# Patient Record
Sex: Female | Born: 1945
Health system: Southern US, Community
[De-identification: ages and names within clinical notes are randomized; demographics above are authoritative.]

## PROBLEM LIST (undated history)

## (undated) DIAGNOSIS — K589 Irritable bowel syndrome without diarrhea: Secondary | ICD-10-CM

## (undated) DIAGNOSIS — F32A Depression, unspecified: Secondary | ICD-10-CM

## (undated) DIAGNOSIS — E229 Hyperfunction of pituitary gland, unspecified: Secondary | ICD-10-CM

## (undated) DIAGNOSIS — M797 Fibromyalgia: Secondary | ICD-10-CM

## (undated) DIAGNOSIS — M1990 Unspecified osteoarthritis, unspecified site: Secondary | ICD-10-CM

## (undated) DIAGNOSIS — H409 Unspecified glaucoma: Secondary | ICD-10-CM

## (undated) DIAGNOSIS — M5136 Other intervertebral disc degeneration, lumbar region: Secondary | ICD-10-CM

## (undated) DIAGNOSIS — E7800 Pure hypercholesterolemia, unspecified: Secondary | ICD-10-CM

## (undated) DIAGNOSIS — F419 Anxiety disorder, unspecified: Secondary | ICD-10-CM

## (undated) DIAGNOSIS — F329 Major depressive disorder, single episode, unspecified: Secondary | ICD-10-CM

## (undated) DIAGNOSIS — N342 Other urethritis: Secondary | ICD-10-CM

## (undated) DIAGNOSIS — S92309A Fracture of unspecified metatarsal bone(s), unspecified foot, initial encounter for closed fracture: Secondary | ICD-10-CM

## (undated) DIAGNOSIS — N3281 Overactive bladder: Secondary | ICD-10-CM

## (undated) DIAGNOSIS — S82899A Other fracture of unspecified lower leg, initial encounter for closed fracture: Secondary | ICD-10-CM

## (undated) HISTORY — DX: Other intervertebral disc degeneration, lumbar region: M51.36

## (undated) HISTORY — PX: HEMORROIDECTOMY: SUR656

## (undated) HISTORY — PX: APPENDECTOMY: SHX54

## (undated) HISTORY — DX: Unspecified osteoarthritis, unspecified site: M19.90

## (undated) HISTORY — DX: Irritable bowel syndrome, unspecified: K58.9

## (undated) HISTORY — PX: OTHER SURGICAL HISTORY: SHX169

## (undated) HISTORY — DX: Unspecified glaucoma: H40.9

## (undated) HISTORY — DX: Overactive bladder: N32.81

## (undated) HISTORY — DX: Depression, unspecified: F32.A

## (undated) HISTORY — DX: Other fracture of unspecified lower leg, initial encounter for closed fracture: S82.899A

## (undated) HISTORY — PX: ABDOMINAL HYSTERECTOMY: SHX81

## (undated) HISTORY — DX: Other urethritis: N34.2

## (undated) HISTORY — DX: Pure hypercholesterolemia, unspecified: E78.00

## (undated) HISTORY — PX: CYSTOSCOPY: SUR368

## (undated) HISTORY — PX: TUBAL LIGATION: SHX77

## (undated) HISTORY — PX: CARPECTOMY HAND: SUR194

## (undated) HISTORY — PX: ROTATOR CUFF REPAIR: SHX139

## (undated) HISTORY — PX: BURCH PROCEDURE: SHX1273

## (undated) HISTORY — PX: INTERSTIM IMPLANT PLACEMENT: SHX5130

## (undated) HISTORY — DX: Fibromyalgia: M79.7

## (undated) HISTORY — PX: CATARACT EXTRACTION: SUR2

## (undated) HISTORY — PX: HYSTEROSCOPY: SHX211

## (undated) HISTORY — PX: TONSILLECTOMY AND ADENOIDECTOMY: SUR1326

## (undated) HISTORY — PX: BREAST BIOPSY: SHX20

## (undated) HISTORY — DX: Major depressive disorder, single episode, unspecified: F32.9

## (undated) HISTORY — PX: CHOLECYSTECTOMY OPEN: SUR202

## (undated) HISTORY — DX: Fracture of unspecified metatarsal bone(s), unspecified foot, initial encounter for closed fracture: S92.309A

## (undated) HISTORY — DX: Hyperfunction of pituitary gland, unspecified: E22.9

---

## 1992-03-14 HISTORY — PX: BREAST EXCISIONAL BIOPSY: SUR124

## 1997-05-22 ENCOUNTER — Ambulatory Visit (HOSPITAL_COMMUNITY): Admission: RE | Admit: 1997-05-22 | Discharge: 1997-05-22 | Payer: Self-pay | Admitting: Gynecology

## 1997-06-12 ENCOUNTER — Encounter: Admission: RE | Admit: 1997-06-12 | Discharge: 1997-09-10 | Payer: Self-pay | Admitting: Orthopaedic Surgery

## 1997-08-27 ENCOUNTER — Ambulatory Visit (HOSPITAL_COMMUNITY): Admission: RE | Admit: 1997-08-27 | Discharge: 1997-08-27 | Payer: Self-pay | Admitting: Gastroenterology

## 1998-05-01 ENCOUNTER — Ambulatory Visit (HOSPITAL_COMMUNITY): Admission: RE | Admit: 1998-05-01 | Discharge: 1998-05-01 | Payer: Self-pay

## 1998-06-30 ENCOUNTER — Encounter: Payer: Self-pay | Admitting: Gynecology

## 1998-06-30 ENCOUNTER — Ambulatory Visit (HOSPITAL_COMMUNITY): Admission: RE | Admit: 1998-06-30 | Discharge: 1998-06-30 | Payer: Self-pay | Admitting: Gynecology

## 1998-07-16 ENCOUNTER — Other Ambulatory Visit: Admission: RE | Admit: 1998-07-16 | Discharge: 1998-07-16 | Payer: Self-pay | Admitting: Gynecology

## 1998-08-19 ENCOUNTER — Encounter: Payer: Self-pay | Admitting: Physician Assistant

## 1998-08-19 ENCOUNTER — Ambulatory Visit (HOSPITAL_COMMUNITY): Admission: RE | Admit: 1998-08-19 | Discharge: 1998-08-19 | Payer: Self-pay | Admitting: Physician Assistant

## 1998-08-21 ENCOUNTER — Ambulatory Visit (HOSPITAL_COMMUNITY): Admission: RE | Admit: 1998-08-21 | Discharge: 1998-08-21 | Payer: Self-pay | Admitting: Neurology

## 1998-09-03 ENCOUNTER — Ambulatory Visit (HOSPITAL_COMMUNITY): Admission: RE | Admit: 1998-09-03 | Discharge: 1998-09-03 | Payer: Self-pay | Admitting: Neurology

## 1998-09-03 ENCOUNTER — Encounter: Payer: Self-pay | Admitting: Neurology

## 1999-07-09 ENCOUNTER — Encounter: Payer: Self-pay | Admitting: Gynecology

## 1999-07-09 ENCOUNTER — Ambulatory Visit (HOSPITAL_COMMUNITY): Admission: RE | Admit: 1999-07-09 | Discharge: 1999-07-09 | Payer: Self-pay | Admitting: Gynecology

## 1999-08-04 ENCOUNTER — Encounter: Payer: Self-pay | Admitting: Urology

## 1999-08-06 ENCOUNTER — Encounter (INDEPENDENT_AMBULATORY_CARE_PROVIDER_SITE_OTHER): Payer: Self-pay | Admitting: Specialist

## 1999-08-06 ENCOUNTER — Encounter: Payer: Self-pay | Admitting: Urology

## 1999-08-06 ENCOUNTER — Ambulatory Visit (HOSPITAL_COMMUNITY): Admission: RE | Admit: 1999-08-06 | Discharge: 1999-08-06 | Payer: Self-pay | Admitting: Urology

## 1999-08-23 ENCOUNTER — Other Ambulatory Visit: Admission: RE | Admit: 1999-08-23 | Discharge: 1999-08-23 | Payer: Self-pay | Admitting: Gynecology

## 1999-08-31 ENCOUNTER — Ambulatory Visit (HOSPITAL_BASED_OUTPATIENT_CLINIC_OR_DEPARTMENT_OTHER): Admission: RE | Admit: 1999-08-31 | Discharge: 1999-09-01 | Payer: Self-pay | Admitting: Orthopaedic Surgery

## 1999-11-23 ENCOUNTER — Encounter: Admission: RE | Admit: 1999-11-23 | Discharge: 1999-11-24 | Payer: Self-pay | Admitting: Orthopaedic Surgery

## 2000-06-12 ENCOUNTER — Encounter: Payer: Self-pay | Admitting: Orthopaedic Surgery

## 2000-06-12 ENCOUNTER — Ambulatory Visit (HOSPITAL_COMMUNITY): Admission: RE | Admit: 2000-06-12 | Discharge: 2000-06-12 | Payer: Self-pay | Admitting: Orthopaedic Surgery

## 2000-06-23 ENCOUNTER — Ambulatory Visit (HOSPITAL_COMMUNITY): Admission: RE | Admit: 2000-06-23 | Discharge: 2000-06-23 | Payer: Self-pay | Admitting: Orthopedic Surgery

## 2000-06-23 ENCOUNTER — Encounter: Payer: Self-pay | Admitting: Orthopedic Surgery

## 2000-07-06 ENCOUNTER — Ambulatory Visit (HOSPITAL_COMMUNITY): Admission: RE | Admit: 2000-07-06 | Discharge: 2000-07-06 | Payer: Self-pay | Admitting: Endocrinology

## 2000-07-06 ENCOUNTER — Encounter: Payer: Self-pay | Admitting: Orthopedic Surgery

## 2000-08-10 ENCOUNTER — Ambulatory Visit (HOSPITAL_BASED_OUTPATIENT_CLINIC_OR_DEPARTMENT_OTHER): Admission: RE | Admit: 2000-08-10 | Discharge: 2000-08-11 | Payer: Self-pay | Admitting: Orthopedic Surgery

## 2000-08-10 ENCOUNTER — Encounter (INDEPENDENT_AMBULATORY_CARE_PROVIDER_SITE_OTHER): Payer: Self-pay | Admitting: Specialist

## 2000-09-13 ENCOUNTER — Ambulatory Visit (HOSPITAL_COMMUNITY): Admission: RE | Admit: 2000-09-13 | Discharge: 2000-09-13 | Payer: Self-pay | Admitting: Gastroenterology

## 2000-09-13 ENCOUNTER — Encounter: Payer: Self-pay | Admitting: Gastroenterology

## 2000-10-11 ENCOUNTER — Inpatient Hospital Stay (HOSPITAL_COMMUNITY): Admission: EM | Admit: 2000-10-11 | Discharge: 2000-10-16 | Payer: Self-pay | Admitting: Emergency Medicine

## 2000-10-11 ENCOUNTER — Encounter: Payer: Self-pay | Admitting: Emergency Medicine

## 2000-10-11 ENCOUNTER — Encounter: Payer: Self-pay | Admitting: Geriatric Medicine

## 2000-10-13 ENCOUNTER — Encounter: Payer: Self-pay | Admitting: Gastroenterology

## 2000-11-02 ENCOUNTER — Other Ambulatory Visit: Admission: RE | Admit: 2000-11-02 | Discharge: 2000-11-02 | Payer: Self-pay | Admitting: Gynecology

## 2001-03-08 ENCOUNTER — Encounter: Payer: Self-pay | Admitting: Gynecology

## 2001-03-08 ENCOUNTER — Encounter: Admission: RE | Admit: 2001-03-08 | Discharge: 2001-03-08 | Payer: Self-pay | Admitting: Gynecology

## 2001-09-21 ENCOUNTER — Encounter: Admission: RE | Admit: 2001-09-21 | Discharge: 2001-09-21 | Payer: Self-pay | Admitting: Gastroenterology

## 2001-09-21 ENCOUNTER — Encounter: Payer: Self-pay | Admitting: Gastroenterology

## 2001-12-25 ENCOUNTER — Ambulatory Visit (HOSPITAL_COMMUNITY): Admission: RE | Admit: 2001-12-25 | Discharge: 2001-12-25 | Payer: Self-pay | Admitting: Pain Medicine

## 2001-12-25 ENCOUNTER — Encounter: Payer: Self-pay | Admitting: Pain Medicine

## 2002-01-22 ENCOUNTER — Other Ambulatory Visit: Admission: RE | Admit: 2002-01-22 | Discharge: 2002-01-22 | Payer: Self-pay | Admitting: Gynecology

## 2002-03-28 ENCOUNTER — Ambulatory Visit (HOSPITAL_COMMUNITY): Admission: RE | Admit: 2002-03-28 | Discharge: 2002-03-28 | Payer: Self-pay | Admitting: Gynecology

## 2002-03-28 ENCOUNTER — Encounter: Payer: Self-pay | Admitting: Gastroenterology

## 2002-04-19 ENCOUNTER — Encounter: Payer: Self-pay | Admitting: Neurosurgery

## 2002-04-23 ENCOUNTER — Inpatient Hospital Stay (HOSPITAL_COMMUNITY): Admission: RE | Admit: 2002-04-23 | Discharge: 2002-04-27 | Payer: Self-pay | Admitting: Neurosurgery

## 2002-04-23 ENCOUNTER — Encounter: Payer: Self-pay | Admitting: Neurosurgery

## 2002-05-13 DIAGNOSIS — R7989 Other specified abnormal findings of blood chemistry: Secondary | ICD-10-CM

## 2002-05-13 HISTORY — DX: Other specified abnormal findings of blood chemistry: R79.89

## 2003-01-21 ENCOUNTER — Encounter: Admission: RE | Admit: 2003-01-21 | Discharge: 2003-04-21 | Payer: Self-pay | Admitting: Gastroenterology

## 2003-01-28 ENCOUNTER — Encounter: Admission: RE | Admit: 2003-01-28 | Discharge: 2003-01-28 | Payer: Self-pay | Admitting: Gastroenterology

## 2003-02-18 ENCOUNTER — Other Ambulatory Visit: Admission: RE | Admit: 2003-02-18 | Discharge: 2003-02-18 | Payer: Self-pay | Admitting: Gynecology

## 2003-07-29 ENCOUNTER — Ambulatory Visit (HOSPITAL_COMMUNITY): Admission: RE | Admit: 2003-07-29 | Discharge: 2003-07-29 | Payer: Self-pay | Admitting: Gastroenterology

## 2004-03-02 ENCOUNTER — Ambulatory Visit (HOSPITAL_COMMUNITY): Admission: RE | Admit: 2004-03-02 | Discharge: 2004-03-02 | Payer: Self-pay | Admitting: Orthopedic Surgery

## 2004-03-03 ENCOUNTER — Ambulatory Visit (HOSPITAL_COMMUNITY): Admission: RE | Admit: 2004-03-03 | Discharge: 2004-03-03 | Payer: Self-pay | Admitting: Neurosurgery

## 2004-03-04 ENCOUNTER — Ambulatory Visit (HOSPITAL_COMMUNITY): Admission: RE | Admit: 2004-03-04 | Discharge: 2004-03-04 | Payer: Self-pay | Admitting: Orthopedic Surgery

## 2004-08-12 ENCOUNTER — Ambulatory Visit (HOSPITAL_COMMUNITY): Admission: RE | Admit: 2004-08-12 | Discharge: 2004-08-12 | Payer: Self-pay | Admitting: Gynecology

## 2004-08-19 ENCOUNTER — Other Ambulatory Visit: Admission: RE | Admit: 2004-08-19 | Discharge: 2004-08-19 | Payer: Self-pay | Admitting: Gynecology

## 2004-09-13 ENCOUNTER — Emergency Department (HOSPITAL_COMMUNITY): Admission: EM | Admit: 2004-09-13 | Discharge: 2004-09-13 | Payer: Self-pay | Admitting: Emergency Medicine

## 2005-03-14 DIAGNOSIS — M51369 Other intervertebral disc degeneration, lumbar region without mention of lumbar back pain or lower extremity pain: Secondary | ICD-10-CM

## 2005-03-14 DIAGNOSIS — M5136 Other intervertebral disc degeneration, lumbar region: Secondary | ICD-10-CM

## 2005-03-14 HISTORY — DX: Other intervertebral disc degeneration, lumbar region without mention of lumbar back pain or lower extremity pain: M51.369

## 2005-03-14 HISTORY — DX: Other intervertebral disc degeneration, lumbar region: M51.36

## 2005-08-22 ENCOUNTER — Other Ambulatory Visit: Admission: RE | Admit: 2005-08-22 | Discharge: 2005-08-22 | Payer: Self-pay | Admitting: Gynecology

## 2005-11-15 ENCOUNTER — Ambulatory Visit (HOSPITAL_COMMUNITY): Admission: RE | Admit: 2005-11-15 | Discharge: 2005-11-15 | Payer: Self-pay | Admitting: Gynecology

## 2006-03-19 ENCOUNTER — Encounter: Admission: RE | Admit: 2006-03-19 | Discharge: 2006-03-19 | Payer: Self-pay | Admitting: Orthopaedic Surgery

## 2006-04-10 ENCOUNTER — Encounter: Admission: RE | Admit: 2006-04-10 | Discharge: 2006-04-10 | Payer: Self-pay | Admitting: Orthopaedic Surgery

## 2006-04-24 ENCOUNTER — Encounter: Admission: RE | Admit: 2006-04-24 | Discharge: 2006-04-24 | Payer: Self-pay | Admitting: Orthopaedic Surgery

## 2006-08-22 ENCOUNTER — Ambulatory Visit (HOSPITAL_BASED_OUTPATIENT_CLINIC_OR_DEPARTMENT_OTHER): Admission: RE | Admit: 2006-08-22 | Discharge: 2006-08-22 | Payer: Self-pay | Admitting: Urology

## 2006-12-01 ENCOUNTER — Ambulatory Visit (HOSPITAL_COMMUNITY): Admission: RE | Admit: 2006-12-01 | Discharge: 2006-12-01 | Payer: Self-pay | Admitting: Gynecology

## 2007-03-08 ENCOUNTER — Emergency Department (HOSPITAL_COMMUNITY): Admission: EM | Admit: 2007-03-08 | Discharge: 2007-03-08 | Payer: Self-pay | Admitting: Emergency Medicine

## 2007-03-15 HISTORY — PX: OTHER SURGICAL HISTORY: SHX169

## 2007-12-19 ENCOUNTER — Ambulatory Visit (HOSPITAL_COMMUNITY): Admission: RE | Admit: 2007-12-19 | Discharge: 2007-12-19 | Payer: Self-pay | Admitting: Gynecology

## 2007-12-22 ENCOUNTER — Encounter: Admission: RE | Admit: 2007-12-22 | Discharge: 2007-12-22 | Payer: Self-pay | Admitting: Orthopaedic Surgery

## 2008-01-16 ENCOUNTER — Ambulatory Visit (HOSPITAL_BASED_OUTPATIENT_CLINIC_OR_DEPARTMENT_OTHER): Admission: RE | Admit: 2008-01-16 | Discharge: 2008-01-17 | Payer: Self-pay | Admitting: Orthopedic Surgery

## 2008-01-16 ENCOUNTER — Encounter (INDEPENDENT_AMBULATORY_CARE_PROVIDER_SITE_OTHER): Payer: Self-pay | Admitting: Orthopedic Surgery

## 2008-01-25 ENCOUNTER — Other Ambulatory Visit: Admission: RE | Admit: 2008-01-25 | Discharge: 2008-01-25 | Payer: Self-pay | Admitting: Gynecology

## 2008-01-25 ENCOUNTER — Ambulatory Visit: Payer: Self-pay | Admitting: Gynecology

## 2008-01-25 ENCOUNTER — Encounter: Payer: Self-pay | Admitting: Gynecology

## 2008-03-14 HISTORY — PX: ORIF RADIAL HEAD / NECK FRACTURE: SUR956

## 2008-06-06 ENCOUNTER — Ambulatory Visit (HOSPITAL_BASED_OUTPATIENT_CLINIC_OR_DEPARTMENT_OTHER): Admission: RE | Admit: 2008-06-06 | Discharge: 2008-06-06 | Payer: Self-pay | Admitting: Orthopedic Surgery

## 2008-07-25 ENCOUNTER — Encounter: Admission: RE | Admit: 2008-07-25 | Discharge: 2008-07-25 | Payer: Self-pay | Admitting: Gastroenterology

## 2009-01-14 ENCOUNTER — Ambulatory Visit (HOSPITAL_COMMUNITY): Admission: RE | Admit: 2009-01-14 | Discharge: 2009-01-14 | Payer: Self-pay | Admitting: Gynecology

## 2009-02-27 ENCOUNTER — Ambulatory Visit: Payer: Self-pay | Admitting: Gynecology

## 2009-04-24 ENCOUNTER — Encounter: Admission: RE | Admit: 2009-04-24 | Discharge: 2009-04-24 | Payer: Self-pay | Admitting: Orthopaedic Surgery

## 2010-01-28 ENCOUNTER — Ambulatory Visit (HOSPITAL_COMMUNITY): Admission: RE | Admit: 2010-01-28 | Discharge: 2010-01-28 | Payer: Self-pay | Admitting: Internal Medicine

## 2010-03-01 ENCOUNTER — Ambulatory Visit: Payer: Self-pay | Admitting: Gynecology

## 2010-04-03 ENCOUNTER — Encounter: Payer: Self-pay | Admitting: Gastroenterology

## 2010-04-03 ENCOUNTER — Encounter: Payer: Self-pay | Admitting: Orthopedic Surgery

## 2010-04-04 ENCOUNTER — Encounter: Payer: Self-pay | Admitting: Gynecology

## 2010-04-04 ENCOUNTER — Encounter: Payer: Self-pay | Admitting: Orthopaedic Surgery

## 2010-06-24 LAB — POCT I-STAT, CHEM 8
Calcium, Ion: 1.01 mmol/L — ABNORMAL LOW (ref 1.12–1.32)
Creatinine, Ser: 0.8 mg/dL (ref 0.4–1.2)
Glucose, Bld: 103 mg/dL — ABNORMAL HIGH (ref 70–99)
HCT: 35 % — ABNORMAL LOW (ref 36.0–46.0)
Potassium: 4.3 mEq/L (ref 3.5–5.1)
TCO2: 26 mmol/L (ref 0–100)

## 2010-06-24 LAB — GLUCOSE, CAPILLARY: Glucose-Capillary: 83 mg/dL (ref 70–99)

## 2010-07-27 NOTE — Op Note (Signed)
NAME:  Linda Lucas, Linda Lucas                 ACCOUNT NO.:  [id number]   MEDICAL RECORD NO.:  [id number]          PATIENT TYPE:  AMB   LOCATION:  DSC                          FACILITY:  [facility]   PHYSICIAN:  Cindee Salt, M.D.       DATE OF BIRTH:  October 31, 1945   DATE OF PROCEDURE:  DATE OF DISCHARGE:                               OPERATIVE REPORT   PREOPERATIVE DIAGNOSES:  Status post proximal carpectomy for keen box  disease, left wrist with degenerative arthritis, cyst distal radius,  left wrist.   POSTOPERATIVE DIAGNOSES:  Status post proximal carpectomy for keen box  disease, left wrist with degenerative arthritis, cyst distal radius,  left wrist.   OPERATION:  Excision of distal radius cyst with bone graft, fusion of  left wrist with Synthes wrist fusion plate, infused bone graft, and  cadaver chips.   SURGEON:  Cindee Salt, MD   ASSISTANT:  RN   ANESTHESIA:  General.   DATE OF OPERATION:  January 16, 2008   ANESTHESIOLOGIST:  Burna Forts, MD   HISTORY:  The patient is a 65 year old female with a history of keen box  disease, has undergone a proximal carpectomy.  This has unfortunately  degenerated with a large cyst forming in the distal radius, continued  pain.  X-rays revealed degeneration of the radiocarpal joint.  She has  elected to proceed to have a fusion performed.  She is aware of risks  and complications including infection, recurrence, injury to arteries,  nerves, tendons, incomplete relief of symptoms, dystrophy, nonunion.  In  the preoperative area, the patient was seen.  The extremity marked by  both the patient and surgeon.  Antibiotic given.   PROCEDURE:  The patient was brought to the operating room where a  general anesthetic was carried out without difficulty after a axillary  block was given.  She was prepped using DuraPrep, supine position, left  arm free.  A time-out was taken.  The extremity was exsanguinated with  an Esmarch bandage.  Tourniquet placed  high and the arm was inflated to  250 mmHg.  A longitudinal incision was made incorporating the old  incision, carried down through subcutaneous tissue.  Bleeders were  electrocauterized.  Dissection was carried down to the third, fourth  dorsal compartment.  This was then elevated off from the distal radius  subperiosteally.  Incision made distally.  The radiocarpal joint was  opened.  Significant synovitis was present.  Complete degeneration of  the articular surface of the lunate facet, and the distal radius was  present along with degeneration of the proximal capitate.  The joint was  debrided of synovial tissue and granulation tissue.  The distal radius  was then curetted removing any remaining cartilage.  A bur was then used  to bur this down.  Multiple drill holes were placed with a  0.062 K-  wire.  The cyst was encountered.  This was opened with the curette.  The  cyst wall was removed.  The rongeur was then used to remove the  articular surface from proximal capitate, the proximal hamate,  and the  trapezoid along with the remnants of the distal radius.  The wound was  then copiously irrigated with saline 5 mL.  The cadaver bone graft chips  were then morselized.  These were then placed into the cyst of the  distal radius entirely filling it.  A 0.7 mL infused bone graft was then  opened.  This was soaked for 10 minutes into the collagen pad.  The bone  chips were then placed into the distal radius.  The infused bone graft  was then placed along with remainder of the cadaver chips distally.  This firmly stabilized the radiocarpal joint.  The straight wrist fusion  plate was then selected.  This was placed onto the distal radius and  attached with a single screw to allow rotation at the level of the  Lister's tubercle.  A 26-mm screw was placed.  The plate was then  rotated as necessary to fit to the third metacarpal.  The  carpometacarpal joint was then opened.  The cartilage  removed with  curettes and rongeurs and this was packed with cadaver bone.  The plate  was rotated back into position and fixed distally with 12-14 mm screws  distally.  These were 2.7 mm screws after drilling with a to 2.0 mm  drill bit.  The x-rays taken revealed the plate lied in good position.  The remaining screws were placed measuring 14-20 mm into the distal  radius.  A 26-mm screw was removed and replaced with a 14-mm screw.  X-  rays confirmed positioning of the wrist with filling of the radiocarpal  joint with the bone.  The wound was again closed with figure-of-eight 2-  0 Vicryl sutures, was then copiously irrigated with saline.  The  retinaculum, subcutaneous tissue was closed in layers with interrupted 4-  0 Vicryl sutures and the skin with interrupted 4-0 Vicryl Rapide  sutures.  A sterile compressive dressing dorsal palmar splint applied  and deflation of tourniquet, all fingers immediately pinked.  She was  taken to the recovery room for observation in satisfactory condition.  Throughout the procedure, bleeders were electrocauterized with bipolar.  The dorsal sensory radial nerve was identified and protected.  On  deflation of the tourniquet, all fingers immediately pinked.  She was  taken to the recovery room for observation in satisfactory condition,  will be admitted for overnight stay.  She will be discharged on Vicodin  to return in 1 week.           ______________________________  Cindee Salt, M.D.     GK/MEDQ  D:  01/16/2008  T:  01/17/2008  Job:  [id number]   cc:   Tasia Catchings, M.D.

## 2010-07-27 NOTE — Op Note (Signed)
Linda Lucas, Linda Lucas                 ACCOUNT NO.:  [id number]   MEDICAL RECORD NO.:  [id number]          PATIENT TYPE:  AMB   LOCATION:  [facility]                         FACILITY:  [facility]   PHYSICIAN:  Jamison Neighbor, M.D.  DATE OF BIRTH:  October 17, 1945   DATE OF PROCEDURE:  08/22/2006  DATE OF DISCHARGE:                               OPERATIVE REPORT   PREOPERATIVE DIAGNOSIS:  1. Chronic cystitis/urethritis rule out interstitial cystitis.  2. Microscopic hematuria.   POSTOPERATIVE DIAGNOSIS:  1. Chronic cystitis/urethritis rule out interstitial cystitis.  2. Microscopic hematuria.   PROCEDURE:  Cystoscopy, urethral calibration, bilateral retrogrades  (duplicated system on left).  Hydrodistention of the bladder Marcaine  and Pyridium installation, Marcaine and Kenalog injection.   SURGEON:  Jamison Neighbor, M.D.   ANESTHESIA:  General.   COMPLICATIONS:  None.   DRAINS:  None.   BRIEF HISTORY:  This 65 year old female has had longstanding problems  with what has been presumed to be chronic cystitis slash urethritis.  She responds at least in part to antibiotic therapy, but recently has  had problems where she has symptoms even when she has negative urine and  an unremarkable physical exam, insofar as the urethritis discharge, etc.  are concerned.  The patient has also had some recent problems with  microscopic hematuria.  She is certainly a candidate to have  interstitial cystitis as she does have known irritable bowel syndrome.  She is interested having a formal evaluation to determine if she does  indeed have IC.  Full informed consent was obtained.   PROCEDURE:  After successful induction of general anesthesia, the  patient was placed in the dorsal position, prepped with Betadine and  draped in usual sterile fashion.  Careful bimanual examination revealed  no significant cystocele.  There was no vault prolapse.  There was no  real enterocele or rectocele and there was nothing  suggestive of  urethral diverticulum.  The urethra was calibrated 32-French with female  urethral sounds with no evidence of stenosis or stricture.  The  cystoscope was inserted.  The bladder was carefully inspected.  No  tumors or stones could be seen.  The right ureteral orifice was normal  in configuration and location on the left-hand side there was fairly  large ureter in the more medial position and had a somewhat smaller  ureter laterally.  As expected, the medial ureter drained the upper pole  segment and the lateral ureter drained the lower pole segment.  Retrograde pyelograms were performed bilaterally.   On the right-hand side a cone-tipped catheter was inserted to the ureter  and contrast was injected.  The patient had a normal-appearing ureter  without filling defect or stricture.  The collecting system was  unremarkable was no evidence of any obstruction.  There were no stones  or filling defects a little bit of pyelovenous backflow was noted due to  over filling of the collecting system.  Clips from gallbladder procedure  were identified on the left-hand side, the retrograde was performed  first in the larger ureteral orifice which was more medial and turned  out to drain the upper pole.  This was a very small segment and will  likely only drain a small portion of the kidney.  There were no filling  defects and drain out films were normal.  The smaller ureter on the left-  hand side actually drained the mid pole and lower pole segment and these  also appeared normal.  The drain out films on both sides were  unremarkable.   After completion of the retrograde studies hydrodistention of the  bladder was performed.  The bladder was distended at a pressure of 100  cm of water for 5 minutes.  When the bladder was drained no granulations  or ulcers could be seen the bladder appeared normal and bladder capacity  was 1000 mL.  There was nothing to suggest a need for biopsy.  There  was  nothing bleeding and really we did not see anything that would serve as  a source for the patient's chronic lower urinary tract symptoms.  Our  plan will be to send her home on her same medications which were  extensive and to continue her on antibiotic prophylaxis plus urinary  acidification which is felt to decrease her infections.  We will take a  look at whether she has any improvement following the procedure and  unless we see major changes we would not consider this to be an  interstitial cystitis situation.  Long term I think the patient will  need to be looked at for anticholinergic therapy and if that should not  work, we will offer her either Botox, a spot in the RTX trial or a trial  InterStim.           ______________________________  Jamison Neighbor, M.D.  Electronically Signed     RJE/MEDQ  D:  08/22/2006  T:  08/22/2006  Job:  [id number]

## 2010-07-27 NOTE — Op Note (Signed)
Linda Lucas, Linda Lucas                 ACCOUNT NO.:  [id number]   MEDICAL RECORD NO.:  [id number]          PATIENT TYPE:  AMB   LOCATION:  DSC                          FACILITY:  [facility]   PHYSICIAN:  Cindee Salt, M.D.       DATE OF BIRTH:  1945-06-27   DATE OF PROCEDURE:  06/06/2008  DATE OF DISCHARGE:                               OPERATIVE REPORT   PREOPERATIVE DIAGNOSIS:  Comminuted intra-articular fracture, right  distal radius.   POSTOPERATIVE DIAGNOSIS:  Comminuted intra-articular fracture, right  distal radius.   OPERATION:  Open reduction and internal fixation, right distal radius  with narrow DVR plate for the right wrist.   SURGEON:  Cindee Salt, MD   ASSISTANT:  Joaquin Courts, RN   ANESTHESIA:  Axillary general.   ANESTHESIOLOGIST:  Janetta Hora. Frederick, MD   HISTORY:  The patient is a 65 year old female who suffered a fall with  comminuted intra-articular fracture of her right distal radius.  This  has displaced.  She is admitted now for open reduction and internal  fixation.  She is aware of risks and complications including infection,  recurrence, injury to arteries, nerves, tendons, incomplete relief of  symptoms, dystrophy, and the possibility of nonunion.  In the  preoperative area, the patient is seen, the extremity marked by both the  patient and surgeon, and antibiotic given.  Questions were again  encouraged and answered.   PROCEDURE IN DETAIL:  The patient was brought to the operating room  where an axillary block was carried out without difficulty under the  direction of Dr. Gelene Mink.  A general anesthetic was also carried out  with LMA.  She was prepped using ChloraPrep, supine position, right arm  free.  The limb was manipulated.  X-rays revealed that the fracture was  not particularly movable.  After a time-out was taken, the limb was  exsanguinated with an Esmarch bandage.  Tourniquet placed high on the  arm was inflated to 250 mmHg.  A volar radial  incision was made, carried  down through subcutaneous tissue.  This was directed over the flexor  carpi radialis tendon.  Bleeders were electrocauterized.  Dissection  carried through the flexor carpi radialis tendon sheath.  Radial artery  was identified and protected.  The pronator quadratus was elevated off  from the distal radius.  The fracture was apparent with displacement.  With blunt and sharp dissection, this was manipulated.  The periosteum  was removed and Freer elevators were used to elevate the fracture  fragment.  This allowed this to be re-manipulated in backward position.  AP and lateral x-rays revealed that it was reduced without fixation,  however, was not stable.  The brachioradialis was then step-cut.  The  narrow DVR plate was then selected.  This was placed, positioned, and  maintained in position with 0.045 K-wires.  X-rays revealed that it was  slightly proximal.  This was readjusted distally, re-pinned, re-x-rayed  and found to lie in good position in both AP and lateral directions.  The gliding screw was then placed.  This measured 10 mm after  drilling  with a 2.7 mm drill bit.  This firmly fixed the plate in position.  The  ulnar-most distal peg was then inserted.  This was inserted as a fully-  threaded nonlocking screw pulling the bone up against the plate.  X-rays  confirmed positioning outside the joint.  This measured 18 mm.  The  remaining pegs were placed through the central holes in the plate.  These were all locking and measured 20-22 mm.  These were each placed  fixing the ulnar component in place.  The radial two screws were placed  as fully threaded screws.  These measured 20 mm and 18 mm.  These were  placed firmly fixing it.  AP and lateral x-rays revealed the plate to  lie in good position.  Obliques revealed that no screws or pins were  within the joint.  The remaining two proximal screws were then placed.  These were each drilled, measured, and  10-mm screws were placed firmly  fixing the plate in position.  X-rays revealed that none of the screws  were long.  The wound was copiously irrigated with saline.  Full  pronation and supination was afforded to the patient with  flexion/extension, radial and ulnar deviation of the wrist.  The  pronator quadratus was then reapproximated over the plate with figure-of-  eight 4-0 Vicryl sutures.  The subcutaneous tissue was closed with  interrupted 4-0 Vicryl and the skin with a subcuticular 4-0 Vicryl  Rapide suture.  A sterile compressive dressing and dorsal palmar splint  applied.  On deflation of the tourniquet, all fingers immediately  pinked.  She was taken to the recovery room for observation in  satisfactory condition.  She will be discharged to home to return to the  [facility] in 1 week on Percocet at her request.  She was  given Percocet 7.5/325, #40 with no refills.           ______________________________  Cindee Salt, M.D.     GK/MEDQ  D:  06/06/2008  T:  06/07/2008  Job:  [id number]

## 2010-07-30 NOTE — Op Note (Signed)
[facility]  Patient:    Linda Lucas, Linda Lucas                        MRN: [id number] Proc. Date: 08/06/99 Adm. Date:  [id number] Disc. Date: [id number] Attending:  Londell Moh CC:         Jamison Neighbor, M.D.                           Operative Report  PREOPERATIVE DIAGNOSES: 1. 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 2. 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 3. Chronic urethritis/cystitis.  POSTOPERATIVE DIAGNOSES: 1. 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 2. 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 3. Chronic urethritis/cystitis.  PROCEDURE:  Cystoscopy, urethral calibration, hydrodistention of the bladder, ureteral catheterization, hydrodistention, cystogram, Marcaine and Pyridium instillation, Marcaine and Kenalog injection, bladder biopsy.  SURGEON:  Jamison Neighbor, M.D.  ANESTHESIA:  General.  COMPLICATIONS:  None.  DRAINS:  None.  BRIEF HISTORY:  This 65 year old female has had problems with urethral syndrome/chronic urethritis for many years.  The patient occasionally has positive cultures but generally has urethral pain without well-documented urinary tract infections.  In the past, she had marked stress urinary incontinence.  Because of her chronic urethral pain, she would not undergo standard vaginal-type surgery and elected to have a Burch procedure.  The Burch procedure has been successful in terms of controlling her stress incontinence, but she has had intermittent episodes of urgency.  The patient is not felt to be obstructed but does need urethral calibration to assess the integrity of her urethra and make sure she is not obstructing.  The patient did not wish to have this done in the office.  In addition, there was increasing evidence that urethral syndrome may be a precursor to interstitial cystitis, and she needs evaluation for that purpose as well.  In the past, the patient had a ureterocele, which was successfully opened,  and under this anesthetic a cystogram was done to rule out reflux as a source of chronic problems.  The patient understands the risks and benefits of the procedure and gave informed consent.  DESCRIPTION OF PROCEDURE:  After successful induction of general anesthesia, the patient was placed in the dorsal lithotomy position and prepped with Betadine and draped in the usual sterile fashion.  Careful bimanual examination revealed good support for the urethra.  In the _____ position, there was no evidence whatsoever for excessive angulation or obstruction, there was no cystocele or ureterocele.  There was a very minimal rectocele. There was no enterocele.  There were no masses detected on bimanual exam.  The urethra easily accepted up to a 84 [place] female urethral sound without the need for dilation.  There was no stenosis or stricture.  The urethra was in normal position with no angulation and no over-correction.  The cystoscope was inserted and the bladder carefully inspected.  No obvious tumors were seen. In the trigone area, there was one small urethral possibly lesion, which was biopsied, and the biopsy site was cauterized.  This appears to be completely benign.  There was no real squamous metaplasia.  The left ureteral orifice was normal.  The right ureter was completely duplicated, with the ureterocele noted.  This had been completely opened, and the ureter now appears to be reasonably normal.  Both ureters on that side were catheterized.  There was no obstruction, and they appeared to be fully functional and normal.  The remainder  of the bladder was unremarkable in its appearance.  The bladder was then distended to 100 cm pressure.  When the bladder was drained, there were no glomerulations seen, no bleeding, essentially ruling out interstitial cystitis.  We do note, however, that the patients capacity is small for reasons that are unclear, with a capacity of only 600 cc.  A cystogram  was done and showed no reflux of either of the duplicated ureteral moieties.  The bladder was drained, and drain-off film was normal.  A mixture of Marcaine and Pyridium was instilled in the bladder, and Marcaine and Kenalog were injected periurethrally.  The patient had gauze placed in the vagina.  She was given Zofran and Toradol in the operating room prior to going to the recovery room. The patient did have a moderate headache prior to the procedure and may require additional pain medication in the postoperative period.  She is given a prescription for Darvocet as well as _____, which is the most effective antibiotic for her in the past.  She will return to my office in three weeks time. DD:  08/06/99 TD:  08/10/99 Job: [id number]

## 2010-07-30 NOTE — Discharge Summary (Signed)
   NAME:  Linda Lucas, Linda Lucas                           ACCOUNT NO.:  [id number]   MEDICAL RECORD NO.:  [id number]                   PATIENT TYPE:  INP   LOCATION:  3019                                 FACILITY:  [facility]   PHYSICIAN:  Clydene Fake, M.D.               DATE OF BIRTH:  1946-01-21   DATE OF ADMISSION:  04/23/2002  DATE OF DISCHARGE:  04/27/2002                                 DISCHARGE SUMMARY   ADMISSION DIAGNOSES:  Herniated nucleus pulposus at C4-5 with severe  spondylosis at C4-5, C5-6, and C6-7.   DISCHARGE DIAGNOSES:  Herniated nucleus pulposus at C4-5 with severe  spondylosis at C4-5, C5-6, and C6-7.   PROCEDURES:  Anterior cervical diskectomy and fusion C4-5, 5-6, and 6-7 with  anterior plate.   REASON FOR ADMISSION:  The patient is a 65 year old woman who has had severe  headaches, neck ache, and found to have herniated disk and spondylosis at  multiple levels of the cervical spine, brought in for decompression and  fusion.   HOSPITAL COURSE:  The patient was admitted on the day of surgery and  underwent the procedure named above without complications.  Postoperatively,  she was transferred to the recovery room and then to the floor.  There she  was making good progress.  Incision remained clear, dry, and intact.  She  did have neck pain but started working on mobilizing.  She did have some  problems voiding and needed some intermittent catheterization.  She did  start voiding well, was ambulating by February 14.  She had a little  abdominal distention, but that resolved by the 14th also.  Incisions  remained clean, dry, and intact.  She had decreased symptoms from  preoperatively.   She was discharged home in stable condition on 04/27/2002.   DISCHARGE MEDICATIONS:  Same as prehospiitalization plus Vicodin p.r.n.   DIET:  As tolerated.   ACTIVITY:  No strenuous activity.   FOLLOW UP:  Dr. Channing Mutters in three to four weeks.             Clydene Fake, M.D.    JRH/MEDQ  D:  06/07/2002  T:  06/08/2002  Job:  [id number]

## 2010-07-30 NOTE — Cardiovascular Report (Signed)
[place]. [facility]  Patient:    Linda Lucas, Linda Lucas                        MRN: [id number] Proc. Date: 10/16/00 Adm. Date:  [id number] Attending:  Ginette Otto CC:         Genene Churn. Sherin Quarry, M.D.   Cardiac Catheterization  REFERRING PHYSICIAN:  Genene Churn. Sherin Quarry, M.D.  HISTORY:  This is a 65 year old obese female with a history of hyperlipidemia who presented to the hospital with chest pain.  She ruled out for myocardial infarction, but continued to have chest pain.  An Adenosine Cardiolite study showed anterior wall defect with stress.  She now presents for cardiac catheterization.  PROCEDURES PERFORMED:  Left heart catheterization, coronary angiography, and left ventriculography.  OPERATOR:  Armanda Magic, M.D.  INDICATIONS:  Chest pain, abnormal Cardiolite.  COMPLICATIONS:  None.  IV ACCESS:  Via right femoral artery with a 6-French sheath.  DESCRIPTION OF PROCEDURE:  The patient was brought to the cardiac catheterization laboratory in the fasting nonsedated state.  Informed consent was obtained.  The patient was connected to continuous heart rate and pulse oximetry monitoring with intermittent blood pressure monitoring.  The right groin was prepped and draped in a sterile fashion.  One percent Xylocaine was used for local anesthesia.  Using the modified Seldinger technique, a 6-French sheath was placed in the right femoral artery.  Under fluoroscopic guidance, a 6-French JL-4 catheter was placed into the left coronary artery.  Multiple cine films were taken in the 30 degree RAO and 40 degree LAO views.  This catheter was then exchanged out over a guide wire for a 6-French JR-4 catheter which was placed in the right coronary artery.  Multiple cine films were taken in the 30 degree RAO and 40 degree LAO views.  This catheter was exchanged out over a guide wire for a 6-French angled pigtail catheter which was placed under  fluoroscopic guidance into the left ventricular cavity.  Left ventriculography was performed in the 30 degree RAO view using a total of 30 cc of contrast at 13 cc/sec.  The catheter was then pulled back across the aortic valve without any significant gradient noted.  The catheter was then removed over a guide wire.  At the end of the procedure, all catheters and sheaths were removed. Manual compression was performed until adequate hemostasis was obtained.  The patient was transferred back to the room in stable condition.  RESULTS: 1. The left main coronary artery is widely patent throughout its course and    bifurcates into a left anterior descending artery and a left circumflex    artery. 2. The left anterior descending artery gives rise to a very large branching    septal perforator, which is widely patent throughout its course.  The    left anterior descending artery is widely patent throughout the rest of its    course and gives rise to one small diagonal branch, which is widely patent. 3. The left circumflex gives rise to either a high obtuse marginal #1 or ramus    branch, which is widely patent.  The left circumflex then gives rise to a    second large branching obtuse marginal branch and actually terminates in    the posterior descending artery.  The terminal left circumflex is widely    patent throughout its course and traverses the AV groove. 4. The right coronary artery is widely patent throughout  its course.  It gives    rise to one RV marginal branch and then bifurcates distally into    posterolateral branches. 5. Left ventriculography performed in the RAO 30 degree view using a total of    30 cc of contrast at 13 cc/sec, showed normal left ventricular function    with ejection fraction of 60% and mild 1-2+ mitral regurgitation.  ASSESSMENT: 1. Noncardiac chest pain. 2. Normal coronary arteries. 3. Normal left ventricular function.  PLAN:  Further workup per Dr.  Sherin Quarry.  It is okay to discharge patient after bed rest today.  Followup with Dr. Mayford Knife in two weeks for groin check. DD:  10/16/00 TD:  10/16/00 Job: [id number]

## 2010-07-30 NOTE — Consult Note (Signed)
[place]. [facility]  Patient:    Linda Lucas, Linda Lucas                          MRN: [id number] Proc. Date: 10/12/00 Attending:  Armanda Magic, M.D. CC:         Genene Churn. Sherin Quarry, M.D.   Consultation Report  REFERRING PHYSICIAN:  Genene Churn. Sherin Quarry, M.D.  CHIEF COMPLAINT:  Chest pain.  HISTORY OF PRESENT ILLNESS:  This is a 65 year old white female who is a patient of Genene Churn. Sherin Quarry, M.D. with a history of fibromyalgia who was admitted on July 31 with substernal chest pain. 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 when she developed sudden, severe, substernal chest pain with radiation to her back and jaw lasting 5 to 10 minutes and then resolving on its own. She states like it felt like a pressure sensation and somebody sitting on her chest. An hour later, she had recurrent symptoms identical to her previous ones, lasting 5 to 10 minutes and resolving on their own. There were no associated symptoms of shortness of breath, nausea, vomiting, or diaphoresis. She has had no episodes of chest pain like this in the past. She denies any exertional dyspnea or chest pain. No PND, orthopnea. No history of palpitations. No dizziness or syncope. In the emergency room, she was pain free but had return of her chest pain last evening with resolution quickly after it began.  ALLERGIES:  Intolerance to ASPIRIN and TYLOX causes nausea.  PAST MEDICAL HISTORY:  Significant for hypothyroidism, migraines, chronic arthritis, fibromyalgia, depression, hypertension, DJD, obesity, and hypertriglyceridemia. Of note, her triglycerides were 600 recently, and she is due to have them rechecked.  PAST SURGICAL HISTORY:  Status post tonsillectomy and adenoidectomy. Status post appendectomy. Status post hemorrhoidectomy. Status post breast biopsy x 2. Status post bilateral tubal ligation. Status post total abdominal hysterectomy. Status  post bladder suspension. Status post cholecystectomy. Right ankle fracture.  FAMILY HISTORY:  Significant for diabetes mellitus, coronary disease, and CVA.  SOCIAL HISTORY:  She is married with two children. One is deceased. 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 She denies tobacco use. She occasionally drinks alcohol.  MEDICATIONS:  1. Effexor 100 mg t.i.d.  2. Atenolol 100 mg q.d.  3. Synthroid 0.1 mg q.d.  4. Vivactil 5 mg q.d.  5. Neurontin 1200 mg t.i.d.  6. Prilosec 30 mg a day.  7. Estratest q.d.  8. Trazodone 50 mg q.h.s.  9. Multivitamin q.d. 10. Septra DS one p.o. q.h.s. 11. Vioxx 25 mg q.d. 12. Klonopin 1 mg p.o. q.h.s. 13. MS-Contin 30 mg p.o. b.i.d. 14. Imitrex p.r.n.  REVIEW OF SYSTEMS:  Other than what is reviewed and stated in the HPI is negative.  PHYSICAL EXAMINATION:  VITAL SIGNS:  Blood pressure is 100 to 129/45-47, heart rate is 60, O2 saturation is 95% on room air.  GENERAL:  Well-developed, well-nourished, obese, white female in no acute distress.  HEENT:  Benign.  NECK:  Supple without lymphadenopathy. Carotid upstrokes are +2 bilaterally. No bruits.  LUNGS:  Clear to auscultation throughout.  HEART:  Regular rate and rhythm. No murmurs, rubs, or gallops. Normal S1, S2.  ABDOMEN:  Soft, nontender, nondistended with active bowel sounds. No hepatosplenomegaly.  EXTREMITIES:  No cyanosis, erythema, or edema. Good distal pulses.  LABORATORY DATA:  Chest x-ray shows no active disease.  Chest CT  is negative for PE and lower extremity is negative for DVT.  EKG:  Normal sinus rhythm with no ST-T wave abnormalities.  Sodium 139, potassium 4.8, chloride 106, bicarb 26, BUN 15, creatinine 1. White cell count 14.5, hematocrit 34.7, hemoglobin 11.4, platelet count 401. CPK 27, MB 0.7, troponin 0.01.  ASSESSMENT:  Chest pain, unquestionable etiology. Chest CT is negative for PE. Cardiac enzymes are negative. EKG shows no  ischemia. Cardiac risk factors include age and family history of coronary disease, plus severe hypertriglyceridemia with her most recent triglyceride level being over 600. She is due for a check soon.  PLAN:  Adenosine Cardiolite in the morning. The patient is unable to exercise due to her fibromyalgia. Will keep n.p.o. after midnight. Also check 2-D echocardiogram in the morning. Apparently, she has a family history of cardiomegaly. Her brother had a cardiomyopathy presumed due to coronary disease. Her mother also had a cardiomyopathy. DD:  10/12/00 TD:  10/13/00 Job: [id number]

## 2010-07-30 NOTE — Op Note (Signed)
[place]. [facility]  Patient:    Linda Lucas, Linda Lucas                        MRN: [id number] Adm. Date:  [id number] Attending:  Ronne Binning                           Operative Report  PREOPERATIVE DIAGNOSIS:  Kienbock disease, left wrist.  POSTOPERATIVE DIAGNOSIS:  Kienbock disease, left wrist.  OPERATION/PROCEDURE:  Proximal carpectomy, left wrist.  SURGEON:  Nicki Reaper, M.D.  ANESTHESIA:  General.  ANESTHESIOLOGIST:  Cliffton Asters. Ivin Booty, M.D.  HISTORY:  The patient is a 65 year old female with a history of Kienbock disease.  This is stage 3 with a fracture and some rotation, positive on bone scan and MRI.  PROCEDURE:  The patient was brought to the operating room where a general anesthetic was carried out without difficulty.  She was prepped and draped using Betadine scrubbing solution with the left arm free, the limb was exsanguinated with an Esmarch bandage placed high in the arm and was inflated to 250 mmHg.  A longitudinal incision was made over the dorsal aspect of the wrist and carried down through subcutaneous tissue.  Bleeders were electrocauterized.  The fourth dorsal compartment was elevated from its radial attachment, the capsule left intact.  With sharp dissection, the joint was opened, very significant synovitis was present.  The joint capsule was then incised off from the distal radius leaving the cuffed tissue for repair. The proximal row was isolated.  The scapholunate and lunate triquetral ligaments were incised.  The triquetrum was removed without difficulty after removal of the dorsal half of the lunate which had fractured.  The triquetrum was removed with blunt and sharp dissection protecting the volar ligament.  The scaphoid was then reduced and protecting the radial scaphoid capitate ligament.  This was removed en toto.  There was mild chondromalotic changes on the proximal capitate.  There were no changes on the distal radius  lunate cassette.  The volar half of the lunate was then removed with blunt and sharp dissection, again protecting the volar ligaments.  The wound was copiously irrigated with saline.  The capitate immediately set into the lunate fossa of the distal radius and was quite stable.  There was no impingement of the radial styloid.  As such, it was not removed. The wound was copiously irrigated with saline.  The capsule was then closed with figure-of-eight 4-0 Mersilene sutures.  The retinaculum repaired with figure-of-eight 4-0 Vicryl sutures.  A TLS wound drain was placed in the depths of the wound.  The subcutaneous tissue was closed with interrupted 4-0 Vicryl and the skin with interrupted 5-0 nylon sutures.  Sterile compressive dressing and dorsal palmar splint was applied. The patient tolerated the procedure well and was taken to the recovery room for observation in satisfactory condition.  She was admitted for overnight stay for pain control.  She will be discharged on Dilaudid and Keflex. DD:  08/10/00 TD:  08/10/00 Job: [id number]

## 2010-07-30 NOTE — H&P (Signed)
NAME:  Linda Lucas, Linda Lucas NO.:  [id number]   MEDICAL RECORD NO.:  [id number]                   PATIENT TYPE:   LOCATION:                                       FACILITY:   PHYSICIAN:  Payton Doughty, M.D.                   DATE OF BIRTH:  [birth year]   DATE OF ADMISSION:  04/23/2002  DATE OF DISCHARGE:                                HISTORY & PHYSICAL   ADMISSION DIAGNOSIS:  Cervical spondylosis.   HISTORY OF PRESENT ILLNESS:  The patient is a 65 year old right-handed white  lady who has had episodes of migraine in the past. She had one that went  on  for a couple months. She had an MRI and got blocks in  her neck which helped  her migraines. She was sent to me with an MR and cervical spine films.   She does not describe shoulder and arm pain, nor does she describe changes  associated with myelopathy. She does, however, have posterior displacement  of her cord and significant disk at C4 and C5 and is admitted for anterior  cervicectomy and fusion.   PAST MEDICAL HISTORY:  1. Remarkable for fibromyalgia.  2. Chronic urethritis.  3. Osteoarthritis in her feet.   MEDICATIONS:  1. Morphine 30 mg 3 to 4 times a day.  2. Aspirin q.d.  3. Effexor 100 mg t.i.d.  4. Atenolol 100 mg q. a.m.  5. Synthroid 0.1 mg q. a.m.  6. Neurontin 1200 mg t.i.d.  7. Prilosec 20 mg q. a.m.  8. Estratest HS 1 q. a.m.  9. Keppra 1000 mg q.h.s.  10.      Trazodone 50 mg q.h.s.  11.      Septra DS 1 q.h.s.  12.      Bextra 20 mg q.h.s.  13.      Lipitor 20 mg q.h.s.  14.      Benadryl 75 mg q.h.s. and 50 mg p.r.n.  15.      Ambien 10 mg q. p.m.  16.      Fiber tabs 4 to 6 q.d.  17.      For migraines she takes 100 mg of Imitrex.  18.      Vicodin 12.5 mg p.r.n.   PAST SURGICAL HISTORY:  1. Tonsillectomy.  2. Hemorrhoidectomy.  3. Appendectomy.  4. Tubal ligation.  5. Dilatation and curettage.  6. Fibroid.  7. Abdominal hysterectomy.  8. Breast biopsy twice.  9.  Cholecystectomy.  10.      Burch bladder procedure.  11.      Fractured metatarsal bone.  12.      Fasciotomy of the left foot.  13.      RSD on the left foot.  14.      ORIF of the ankle.  15.      Carpectomy on the left hand.  16.      Osteotripsy.   ALLERGIES:  ASPIRIN AND TYLOX WHICH ARE SENSITIVITIES.   SOCIAL HISTORY:  She does not smoke or drink. She is a clinical nurse  specialist.   REVIEW OF SYSTEMS:  Remarkable for glasses, hypertension,  hypercholesteremia, swelling in her hands and feet, reflux, joint pain, neck  pain, occasional nipple discharge, difficulty with her memory, difficulty  concentrating, depression, thyroid disease, hormone problems and nasal  allergies.   PHYSICAL EXAMINATION:  HEENT:  Normal.  NECK:  Good range of motion.  CHEST:  Clear.  CARDIAC:  Regular rate and rhythm.  ABDOMEN:  Nontender with no hepatosplenomegaly.  EXTREMITIES:  Without clubbing or cyanosis. Foot pulses are good.  GENITOURINARY:  Examination is deferred.  NEUROLOGIC:  She is awake, alert, oriented. Cranial nerves are intact. Motor  examination, she has 5/5 strength throughout the upper extremity and no  current sensory deficit. Reflexes are 1 at the biceps, 2 at the triceps, 1  at the brachial radialis bilaterally. Hoffmann's is negative and the lower  extremities do not demonstrate myelopathy.   LABORATORY DATA:  Plain films showed degenerative changes at 5-6 and 6-7,  loss of lordosis and  kyphotic deformity at C4-5. An MRI confirms these  findings showing posterior displacement of  the cord at 5-6 and 6-7. Her  herniated disk is eccentric to the right at C4-5 with effacement of the  epidural space and posterior displacement of the cord.   IMPRESSION:  Cervical spondylosis with a herniated disk without myelopathy.   PLAN:  The plan is for an anterior cervicectomy and fusion. I do not think  this is causing her headache, but I do think  cord compression is untenable.  I  think she would benefit from the operation anterior cervicectomy and  fusion. The risks and benefits of this approach have been discussed and she  wishes to proceed.                                               Payton Doughty, M.D.    MWR/MEDQ  D:  04/23/2002  T:  04/23/2002  Job:  [id number]

## 2010-07-30 NOTE — Op Note (Signed)
NAME:  Linda Lucas, Linda Lucas                           ACCOUNT NO.:  [id number]   MEDICAL RECORD NO.:  [id number]                   PATIENT TYPE:  INP   LOCATION:  2899                                 FACILITY:  [facility]   PHYSICIAN:  Payton Doughty, M.D.                   DATE OF BIRTH:  1945/06/03   DATE OF PROCEDURE:  04/23/2002  DATE OF DISCHARGE:                                 OPERATIVE REPORT   PREOPERATIVE DIAGNOSIS:  Spondylosis C4-5, C5-6, and C6-7.   POSTOPERATIVE DIAGNOSIS:  Spondylosis C4-5, C5-6, and C6-7.   PROCEDURE:  C4-5, C5-6, and C6-7 anterior cervical diskectomy and tethered  plate.   SURGEON:  Payton Doughty, M.D.   ANESTHESIA:  General endotracheal.  Prepped with Betadine prep and scrubbed  with alcohol wipe.   COMPLICATIONS:  None.   ASSISTANT:  [place].   INDICATIONS FOR PROCEDURE:  A 65 year old lady with cervical spondylosis and  posterior cord displacement.  She was taken to the operating room,  anesthetized, and intubated.  Placed supine on the operating table in the  Halter head traction.  Following shave, prepped and draped in the usual  sterile fashion.  The skin was incised in the midline in the medial border  of the sternocleidomastoid muscle at the level of the carotid tubercle. The  platysma was identified, elevated, divided, and undermined.  Sternocleidomastoid was identified.  Medial dissection revealed the carotid  artery retracted laterally to the left, trachea and esophagus retracted  laterally to the right, exposing the bones in the anterior cervical spine.  Marker was placed. Intraoperative x-ray obtained to confirm the correctness  of level.  Having confirmed correctness of level, the longus coli was taken  down bilaterally from 4-5 to 6-7 and the self retaining retractor placed.  Diskectomy was carried out at C4-5, C5-6, and C6-7.  This was done under  gross observation. The operating microscope was then brought in and  microdissection  technique used to dissect the anterior epidural space,  remove the posterior longitudinal ligament, and carefully explore and  decompress the neuroforamen bilaterally at all three levels.  At 4-5, there  was a disk at 5-6 and 6-7 that was mostly spondylitic disease.  Having  completed decompression, 7 mm bone grafts were fashioned from patellar  allograft and tapped into place. A tethered plate was then placed with 12 mm  screws, two in C4, one in C5, one in C6, and two in C7.  The screws were  tightened.  Intraoperative x-ray showed good placement of bone grafts,  plate, and screws.  The wound was irrigated. Hemostasis was assured. The  platysma was reapproximated with 3-0 Vicryl in interrupted fashion.  Subcutaneous tissue was reapproximated with 3-0 Vicryl in interrupted  fashion.  Subcutaneous tissue was reapproximated with 3-0 Vicryl in  interrupted fashion. The skin was closed with 4-0 Vicryl in a running  subcuticular  fashion. Benzoin and Steri-Strips were placed and made  occlusive with Telfa and Op-Site and the patient placed in Aspen collar and  returned to the recovery room in good condition.                                               Payton Doughty, M.D.    MWR/MEDQ  D:  04/23/2002  T:  04/23/2002  Job:  [id number]

## 2010-07-30 NOTE — Op Note (Signed)
[place]. [facility]  Patient:    Linda Lucas, Linda Lucas                        MRN: [id number] Proc. Date: 08/31/99 Adm. Date:  [id number] Disc. Date: [id number] Attending:  Randolm Idol                           Operative Report  PREOPERATIVE DIAGNOSIS:  Displaced bimalleolar fracture right ankle.  POSTOPERATIVE DIAGNOSIS:  Displaced bimalleolar fracture right ankle.  PROCEDURE:  Open reduction/internal fixation.  SURGEON:  Claude Manges. Cleophas Dunker, M.D.  ASSISTANT:  Arnoldo Morale, P.A.- C.  ANESTHESIA:  General orotracheal.  COMPLICATIONS:  None.  HISTORY:  This 65 year old female sustained a displaced bimalleolar fracture of her right ankle while visiting in [place] four days ago.  The extremity was casted and she returned home for evaluation.  Films reveal a laterally positioned talus with a bimalleolar fracture.  She is now to have open reduction/internal fixation.  DESCRIPTION OF PROCEDURE:  With the patient comfortable on the operating table and under general orotracheal anesthesia, the right lower extremity was placed in a thigh tourniquet.  The cast was then removed.  The skin was intact.  The leg was then prepped from the tips of the toes to the knee with Betadine scrub and Duraprep.  Sterile draping was performed.  With the extremity still elevated, it was Esmarch exsanguinated with a proximal tourniquet at 350 mmHg.  A longitudinal incision was made over the distal fibula extending approximately 4-5 inches, via sharp dissection, it was carried down through subcutaneous tissue, muscle fascia was identified and opened bluntly.  There was unclotted blood that was evacuated.  The fracture was identified of the distal fibula was an oblique originating just above the ankle joint.  Under direct visualization, the fracture was reduced to maintain with a bone clamp. A 7-hole semitubular titanium plate was then applied.  Each of the screw holes were  then subsequently drilled, measured and filled with self-tapping cortical screws proximal to the fracture and two cortical screws distal.  The more proximal of the distal three holes was at the fracture site and accordingly, screw was not inserted.  A single oblique screw was placed across the fracture site from anterior to posterior with a cortical screw to maintain position and obtain further compression.  Image intensification revealed good position of the screws and the plate.  A slightly oblique incision was made centered over the medial malleolus and via sharp dissection carried down to subcutaneous tissue.  By blunt dissection, the soft tissue was elevated off the medial malleolus.  The fracture was nearly anatomic.  There was a longitudinal crack through the distal portion separating in two pieces, i.e., anteriorly and posteriorly.  A K-wire was used to maintain the fracture fragment. A drill hole was then made and 60 mm cancellous screw with a washer was placed across the fracture site. A second screw was then placed anterior to posterior and obliquely across the fracture site to further stabilize the fracture and to fix the more anterior fragment.  Image intensification revealed good position.  The initial films revealed posterior penetration of the cortex with the cancellous screw and this was repositioned anteriorly with excellent position noted on subsequent image intensification.  Both wounds were then irrigated with saline solution.  The periosteum closed with 4-0 Ethilon, subcu closed with 4-0 Vicryl and skin closed with skin  clips.  Marcaine 0.25% without epinephrine was injected into the wound edges. A sterile bulky dressing was applied, followed by a posterior splint with the ankle in neutral.  Tourniquet was deflated with immediate capillary refill to the toes.  The patient tolerated the procedure without complications.  PLAN:  Recovery care, Mepergan fortis.  She  has a walker.  Will need a wheelchair.  Will check her in the office in a week. DD:  08/31/99 TD:  09/02/99 Job: [id number]

## 2010-07-30 NOTE — Discharge Summary (Signed)
[place]. [facility] [place]  Patient:    Linda Lucas, Linda Lucas                        MRN: [id number] Adm. Date:  [id number] Disc. Date: [id number] Attending:  Daine Gip                           Discharge Summary  DISCHARGE DIAGNOSES:  1. Atypical chest pain, no myocardial infarction and no pulmonary embolus.  2. Normal cardiac catheterization.  3. Fibromyalgia.  4. Degenerative joint disease.  5. Irritable bowel syndrome.  6. Depression.  7. Anemia.  8. Allergic rhinitis.  9. Hypertension. 10. Gastroesophageal reflux disease. 11. Migraine headaches. 12. Urethritis. 13. Postmenopausal.  DISCHARGE MEDICATIONS:  1. Effexor 100 mg t.i.d.  2. Atenolol 100 mg daily.  3. Synthroid 0.1 mg daily.  4. Vivactil 5 mg daily.  5. Neurontin 1200 mg t.i.d.  6. Prilosec 20 mg q.a.m.  7. Estratest one daily.  8. Trazodone 50 mg at bedtime.  9. Septra DS one tablet daily. 10. Vioxx 25 mg daily. 11. Klonopin 1 mg q.h.s. 12. Multivitamins daily. 13. Imitrex as needed.  ACTIVITY:  The patient may return to work next week.  DIET:  No added salt.  Low cholesterol.  CONDITION ON DISCHARGE:  Improved.  FOLLOW-UP:  She is to come in for a fasting lipid profile next week.  She is to keep her regularly scheduled physical appointment.  BRIEF HISTORY:  Linda Lucas is a 65 year old white female admitted with two episodes of severe, but atypical chest pain, each lasting about 10 minutes and occurring at rest.  Because of considerable risk factors for coronary disease, she was admitted to have an MI ruled out.  PAST MEDICAL HISTORY:  She has a long history of additional medical problems, none of which were germane.  PHYSICAL EXAMINATION:  An obese white female in no acute distress.  HEART AND LUNGS:  Normal exam without pleural or pericardial friction rub.  ABDOMEN:  Exogenous obesity.  Otherwise negative.  LABORATORY DATA:  The initial CBC revealed a white count of  14,500 which returned to normal.  The hemoglobin was 11.4 where it remained.  The platelet count was normal.  Her cardiac enzymes revealed a normal CK-MB, a low CK total, and normal troponins.  Lipid studies revealed a cholesterol of 212, triglycerides 284, and an HDL of 33 with an LDL of 122.  She had a TSH of 6.3, but was in the process of having her thyroid medication increased.  Her chemistry tests were normal with the exception of a nonfasting blood sugar of 129.  HOSPITAL COURSE:  The patient was placed at bedrest and on telemetry and had no arrhythmia.  An MI was ruled out.  She was then seen by Armanda Magic, M.D., our cardiologist, who obtained an adenosine thallium study that looked potentially normal, but there were anterior changes that might have been due to breast attenuation or possibly to ischemia.  For that reason, she underwent a cardiac catheterization on October 16, 2000, which was entirely normal.  The patients other medical problems were not addressed on this admission. DD:  10/16/00 TD:  10/18/00 Job: [id number]

## 2010-07-30 NOTE — H&P (Signed)
[place]. [facility]  Patient:    Linda Lucas, Linda Lucas                        MRN: [id number] Adm. Date:  [id number] Attending:  Molpus, John L                         History and Physical  CHIEF COMPLAINT: Ms. Dickison is a nice 64 year old white female, followed by Dr. Sherin Quarry, Dr. Meryl Crutch, Dr. Marcelyn Bruins, and Dr. Audie Box.  She has a past medical history of fibromyalgia and also migraines.  She works at [employer] [place] and is a Production designer, theatre/television/film in OB/GYN.  She was at work this evening sitting at her desk at 5:30 p.m. and had severe chest pain which radiated to her back and her jaw that lasted about five or ten minutes and then occurred again an hour later.  HISTORY OF PRESENT ILLNESS: She was instructed to come to the emergency room. She denied any nausea, no dyspnea, no diaphoresis.  Currently she is pain-free.  ALLERGIES:  1. She has an intolerance to large doses of ASPIRIN.  2. She has an allergy to TYLOX, causes nausea.  CURRENT MEDICATIONS:  1. Effexor 100 mg t.i.d.  2. Atenolol 100 mg p.o. q.d.  3. Synthroid 0.1 mg p.o. q.d.  4. Vivactil 5 mg p.o. q.d.  5. Neurontin 1200 mg p.o. t.i.d.  6. Prilosec 30 mg p.o. q.d.  7. Estratest q.d.  7. Trazodone 50 mg q.h.s.  8. Multivitamin q.d.  9. Septra DS one p.o. q.h.s. 10. Vioxx 25 mg q.d. 11. Klonopin 1 mg p.o. q.h.s. 12. MS Contin 30 mg p.o. b.i.d. 13. Imitrex p.r.n.  PAST MEDICAL HISTORY:  1. Hypothyroidism.  2. Migraines.  3. Chronic urethritis.  4. Fibromyalgia.  5. Depression.  6. Hypertension.  7. DJD.  8. Obesity.  9. Hypertriglyceridemia.  No history of diabetes, coronary artery disease, or cancer.  PAST SURGICAL HISTORY:  1. Tonsillectomy and adenoidectomy.  2. Appendectomy.  3. Hemorrhoidectomy.  4. Breast biopsy x 2.  5. Bilateral tubal ligation.  6. Total abdominal hysterectomy.  7. Bladder suspension procedure.  8. Cholecystectomy.  9. Right ankle  fracture. 10. Left lunate avascular necrosis.  FAMILY HISTORY: Diabetes remarkable for mother and father, also history of coronary artery disease and stroke.  SOCIAL HISTORY: She is married.  She had two children, one daughter died - she was a Water quality scientist and died with AIDS; other child in good health.  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 Does not smoke, rarely consumes alcohol.  REVIEW OF SYSTEMS: Currently denies headache.  No change in vision or hearing. No shortness of breath.  Currently no chest pain, no abdominal pain, no change in bowel or urinary habits.  Denies any joint pain.  PHYSICAL EXAMINATION:  VITAL SIGNS: Temperature 96.8 degrees, blood pressure 122/48.  O2 saturation 95%.  Pulse 69.  SKIN: Warm.  HEENT: PERRL.  Discs sharp.  TMs normal.  Oropharynx moist.  NECK: No JVD or thyromegaly.  LUNGS: Clear.  HEART: Regular rate and rhythm without murmurs, rubs, or gallops.  ABDOMEN: Obese.  No hepatosplenomegaly or masses palpated.  RECTAL: Not indicated.  EXTREMITIES: No lower extremity edema.  NEUROLOGIC: Nonfocal.  LABORATORY DATA: EKG normal.  Chest x-ray, no active disease.  CPK 27, MB 0.7, troponin 0.01.  CBC pending.  Sodium 139, potassium 4.8,  chloride 106, BUN 15, bicarbonate 49, creatinine 1.0.  A pH is 7.32, pCO2 49.  ASSESSMENT:  1. Substernal chest pain x 2 with a history of obesity, hypertension, and     family history of coronary artery disease.  Must worry about pulmonary     embolus and also possibility of unstable angina.  2. Obesity.  3. Fibromyalgia.  PLAN: Will admit and will obtain CT scan to rule out pulmonary embolus. Serial enzymes to rule out myocardial infarction and then likely cardiology consultation for possible noninvasive cardiac work-up. DD:  10/11/00 TD:  10/12/00 Job: [id number]

## 2010-12-14 LAB — GLUCOSE, CAPILLARY
Glucose-Capillary: 110 — ABNORMAL HIGH
Glucose-Capillary: 115 — ABNORMAL HIGH
Glucose-Capillary: 139 — ABNORMAL HIGH
Glucose-Capillary: 90
Glucose-Capillary: 92

## 2010-12-14 LAB — BASIC METABOLIC PANEL
Calcium: 9.4
Chloride: 102
GFR calc Af Amer: >60
GFR calc non Af Amer: >60
Glucose, Bld: 186 — ABNORMAL HIGH

## 2010-12-14 LAB — POCT HEMOGLOBIN-HEMACUE: Hemoglobin: 14.5

## 2010-12-17 LAB — RAPID STREP SCREEN (MED CTR MEBANE ONLY): Streptococcus, Group A Screen (Direct): POSITIVE — AB

## 2010-12-30 LAB — POCT I-STAT 4, (NA,K, GLUC, HGB,HCT): Operator id: 133231

## 2011-01-04 DIAGNOSIS — G2581 Restless legs syndrome: Secondary | ICD-10-CM | POA: Insufficient documentation

## 2011-01-04 DIAGNOSIS — F324 Major depressive disorder, single episode, in partial remission: Secondary | ICD-10-CM | POA: Insufficient documentation

## 2011-01-31 ENCOUNTER — Other Ambulatory Visit: Payer: Self-pay | Admitting: *Deleted

## 2011-01-31 MED ORDER — NYSTATIN-TRIAMCINOLONE 100000-0.1 UNIT/GM-% EX CREA: TOPICAL_CREAM | Freq: Four times a day (QID) | CUTANEOUS | Status: AC

## 2011-02-16 ENCOUNTER — Other Ambulatory Visit: Payer: Self-pay | Admitting: Gynecology

## 2011-02-16 DIAGNOSIS — Z1231 Encounter for screening mammogram for malignant neoplasm of breast: Secondary | ICD-10-CM

## 2011-02-17 ENCOUNTER — Telehealth: Payer: Self-pay | Admitting: *Deleted

## 2011-02-17 NOTE — Telephone Encounter (Signed)
Pt called and left message about have bone density and mammo appt?  Lm for pt to call.

## 2011-02-17 NOTE — Telephone Encounter (Signed)
Pt called and wanted bone density order faxed for [facility] center on 03/11/11, order faxed

## 2011-02-18 ENCOUNTER — Other Ambulatory Visit: Payer: Self-pay | Admitting: Gynecology

## 2011-02-24 DIAGNOSIS — E7800 Pure hypercholesterolemia, unspecified: Secondary | ICD-10-CM | POA: Insufficient documentation

## 2011-02-24 DIAGNOSIS — G43909 Migraine, unspecified, not intractable, without status migrainosus: Secondary | ICD-10-CM | POA: Insufficient documentation

## 2011-02-24 DIAGNOSIS — M1990 Unspecified osteoarthritis, unspecified site: Secondary | ICD-10-CM | POA: Insufficient documentation

## 2011-02-24 DIAGNOSIS — F32A Depression, unspecified: Secondary | ICD-10-CM | POA: Insufficient documentation

## 2011-02-24 DIAGNOSIS — F329 Major depressive disorder, single episode, unspecified: Secondary | ICD-10-CM | POA: Insufficient documentation

## 2011-02-24 DIAGNOSIS — K589 Irritable bowel syndrome without diarrhea: Secondary | ICD-10-CM | POA: Insufficient documentation

## 2011-02-24 DIAGNOSIS — M797 Fibromyalgia: Secondary | ICD-10-CM | POA: Insufficient documentation

## 2011-02-24 DIAGNOSIS — R5382 Chronic fatigue, unspecified: Secondary | ICD-10-CM | POA: Insufficient documentation

## 2011-02-24 DIAGNOSIS — E119 Type 2 diabetes mellitus without complications: Secondary | ICD-10-CM | POA: Insufficient documentation

## 2011-02-24 DIAGNOSIS — S92309A Fracture of unspecified metatarsal bone(s), unspecified foot, initial encounter for closed fracture: Secondary | ICD-10-CM | POA: Insufficient documentation

## 2011-02-24 DIAGNOSIS — M5136 Other intervertebral disc degeneration, lumbar region: Secondary | ICD-10-CM | POA: Insufficient documentation

## 2011-02-24 DIAGNOSIS — S82899A Other fracture of unspecified lower leg, initial encounter for closed fracture: Secondary | ICD-10-CM | POA: Insufficient documentation

## 2011-02-24 DIAGNOSIS — R7989 Other specified abnormal findings of blood chemistry: Secondary | ICD-10-CM | POA: Insufficient documentation

## 2011-02-24 DIAGNOSIS — N342 Other urethritis: Secondary | ICD-10-CM | POA: Insufficient documentation

## 2011-03-03 ENCOUNTER — Encounter: Payer: Self-pay | Admitting: Gynecology

## 2011-03-11 ENCOUNTER — Ambulatory Visit: Payer: Self-pay

## 2011-03-11 ENCOUNTER — Other Ambulatory Visit: Payer: Self-pay

## 2011-03-31 ENCOUNTER — Other Ambulatory Visit: Payer: Self-pay | Admitting: Gynecology

## 2011-04-26 ENCOUNTER — Other Ambulatory Visit: Payer: Self-pay | Admitting: Gynecology

## 2011-05-09 ENCOUNTER — Encounter: Payer: Self-pay | Admitting: Gynecology

## 2011-05-12 ENCOUNTER — Encounter: Payer: Self-pay | Admitting: Gynecology

## 2011-05-24 ENCOUNTER — Ambulatory Visit (HOSPITAL_COMMUNITY)
Admission: RE | Admit: 2011-05-24 | Discharge: 2011-05-24 | Disposition: A | Discharge status: Home / Self Care | Payer: Medicare Other | Source: Ambulatory Visit | Attending: Gynecology | Admitting: Gynecology

## 2011-05-24 DIAGNOSIS — Z1231 Encounter for screening mammogram for malignant neoplasm of breast: Secondary | ICD-10-CM | POA: Insufficient documentation

## 2011-05-25 ENCOUNTER — Telehealth: Payer: Self-pay | Admitting: *Deleted

## 2011-05-25 DIAGNOSIS — M810 Age-related osteoporosis without current pathological fracture: Secondary | ICD-10-CM

## 2011-05-25 NOTE — Telephone Encounter (Signed)
Called patient to inform her that her dexa will need to be done here in our office beings she had her last one done here to keep her on the same machine.  New order put in pc and she will call back to schedule.

## 2011-06-07 ENCOUNTER — Encounter: Payer: Self-pay | Admitting: Gynecology

## 2011-06-07 ENCOUNTER — Ambulatory Visit (INDEPENDENT_AMBULATORY_CARE_PROVIDER_SITE_OTHER): Payer: Medicare Other | Admitting: Gynecology

## 2011-06-07 VITALS — BP 124/64 | Ht 63.0 in | Wt 208.0 lb

## 2011-06-07 DIAGNOSIS — E229 Hyperfunction of pituitary gland, unspecified: Secondary | ICD-10-CM

## 2011-06-07 DIAGNOSIS — N952 Postmenopausal atrophic vaginitis: Secondary | ICD-10-CM

## 2011-06-07 DIAGNOSIS — Z780 Asymptomatic menopausal state: Secondary | ICD-10-CM

## 2011-06-07 DIAGNOSIS — L259 Unspecified contact dermatitis, unspecified cause: Secondary | ICD-10-CM

## 2011-06-07 DIAGNOSIS — Z79890 Hormone replacement therapy (postmenopausal): Secondary | ICD-10-CM

## 2011-06-07 DIAGNOSIS — L309 Dermatitis, unspecified: Secondary | ICD-10-CM

## 2011-06-07 DIAGNOSIS — N3281 Overactive bladder: Secondary | ICD-10-CM | POA: Insufficient documentation

## 2011-06-07 DIAGNOSIS — E221 Hyperprolactinemia: Secondary | ICD-10-CM

## 2011-06-07 MED ORDER — ESTRADIOL 1 MG PO TABS: 1.0000 mg | ORAL_TABLET | Freq: Every day | ORAL | Status: DC

## 2011-06-07 MED ORDER — NYSTATIN-TRIAMCINOLONE 100000-0.1 UNIT/GM-% EX OINT: TOPICAL_OINTMENT | Freq: Two times a day (BID) | CUTANEOUS | Status: DC

## 2011-06-07 MED ORDER — NYSTATIN 100000 UNIT/GM EX POWD: CUTANEOUS | Status: DC

## 2011-06-07 MED ORDER — FLUCONAZOLE 150 MG PO TABS: 150.0000 mg | ORAL_TABLET | Freq: Once | ORAL | Status: DC

## 2011-06-07 NOTE — Progress Notes (Signed)
Linda Lucas January 09, 1946 [id number]        66 y.o.  for follow up. Several issues noted below.  Past medical history,surgical history, medications, allergies, family history and social history were all reviewed and documented in the EPIC chart. ROS:  Was performed and pertinent positives and negatives are included in the history.  Exam: Sherrilyn Rist chaperone present Filed Vitals:   06/07/11 1510  BP: 124/64   General appearance  Normal Skin grossly normal Head/Neck normal with no cervical or supraclavicular adenopathy thyroid normal Lungs  clear Cardiac RR, without RMG Abdominal  soft, nontender, without masses, organomegaly or hernia. With panniculus and underlying superficial rash consistent with fungal. Breasts  examined lying and sitting without masses, retractions, discharge or axillary adenopathy. Pelvic  Ext/BUS/vagina  normal with atrophic genital changes  Adnexa  Without masses or tenderness    Anus and perineum  normal   Rectovaginal  normal sphincter tone without palpated masses or tenderness.    Assessment/Plan:  66 y.o. female for annual exam.    1. HRT. Patient continues on estradiol 1 mg daily. She had tried weaning in the past with significant hot flushes sweats vaginal dryness. I again reviewed the WHI study with increased risks of stroke heart attack DVT possible breast cancer risk. I reviewed the ACOG and NAMS statements her lowest dose for shortest period of time. Patient understands accepts and wants to continue I refilled her times a year. 2. Dermatitis. Patient has a fungal type dermatitis under her panniculus. She uses Mycolog type cream intermittently which seems to help. She takes Diflucan monthly which he says clears a little bit but then reoccurs. She has skin changes suggestive of stria in both groin areas.  No significant weight gain or other symptoms. We'll continue to treat her dermatitis with Mycolog I refilled her 60 g tube. Also suggested a trial of Nystop  powdered bedtime. Lastly I recommended Diflucan 150 weekly x6 months to see if we can improve her symptoms. 3. Breast health. Patient recently had a mammogram this month. We'll continue with annual mammography. SBE monthly reviewed. 4. Pap smear. No Pap smear was done today. She has no history of significant abnormal Pap smears in the past and is status post hysterectomy for benign indications. She is over the age of 38 and we both agree on stopping Pap smears per current screening guidelines. 5. Colonoscopy. Patient is due for colonoscopy next year and will follow up for this. 6. DEXA. Patient is due for DEXA. Her last bone density was 2008 and was normal. Increase calcium vitamin D reviewed. 7. History hyperprolactinemia. Patient does have a history of hyperprolactinemia with prolactin levels in the 20-40 range. She a normal MRI 2004. I do not have a prolactin level recently for several years and I went ahead and ordered one today. 8. Health maintenance. No other blood work was done today as was all done through her other physician's offices who see her for her multiple medical issues.    Dara Lords MD, 4:10 PM 06/07/2011

## 2011-06-07 NOTE — Patient Instructions (Signed)
Follow up for bone density study as scheduled 

## 2011-06-08 LAB — PROLACTIN: Prolactin: 7 ng/mL

## 2011-06-16 ENCOUNTER — Encounter: Payer: Self-pay | Admitting: Gynecology

## 2011-06-22 ENCOUNTER — Ambulatory Visit (INDEPENDENT_AMBULATORY_CARE_PROVIDER_SITE_OTHER): Payer: Medicare Other

## 2011-06-22 DIAGNOSIS — Z13820 Encounter for screening for osteoporosis: Secondary | ICD-10-CM

## 2011-06-22 DIAGNOSIS — M810 Age-related osteoporosis without current pathological fracture: Secondary | ICD-10-CM

## 2011-06-28 ENCOUNTER — Other Ambulatory Visit: Payer: Self-pay | Admitting: Gynecology

## 2011-07-25 DIAGNOSIS — N3941 Urge incontinence: Secondary | ICD-10-CM | POA: Insufficient documentation

## 2011-07-25 DIAGNOSIS — R300 Dysuria: Secondary | ICD-10-CM | POA: Insufficient documentation

## 2011-07-25 DIAGNOSIS — IMO0002 Reserved for concepts with insufficient information to code with codable children: Secondary | ICD-10-CM | POA: Insufficient documentation

## 2011-07-25 DIAGNOSIS — R3915 Urgency of urination: Secondary | ICD-10-CM | POA: Insufficient documentation

## 2011-08-05 ENCOUNTER — Other Ambulatory Visit: Payer: Self-pay | Admitting: Neurology

## 2011-08-05 DIAGNOSIS — R269 Unspecified abnormalities of gait and mobility: Secondary | ICD-10-CM

## 2011-08-10 ENCOUNTER — Other Ambulatory Visit: Payer: Medicare Other

## 2011-08-16 ENCOUNTER — Ambulatory Visit: Payer: Medicare Other | Admitting: *Deleted

## 2011-11-19 ENCOUNTER — Other Ambulatory Visit: Payer: Self-pay | Admitting: Gynecology

## 2012-01-23 ENCOUNTER — Other Ambulatory Visit: Payer: Self-pay | Admitting: Gynecology

## 2012-01-23 NOTE — Telephone Encounter (Signed)
She uses it for a fungal dermatitis under her penniculus. Ok for refill?

## 2012-02-27 ENCOUNTER — Other Ambulatory Visit: Payer: Self-pay | Admitting: Gynecology

## 2012-02-27 DIAGNOSIS — Z1231 Encounter for screening mammogram for malignant neoplasm of breast: Secondary | ICD-10-CM

## 2012-03-31 ENCOUNTER — Other Ambulatory Visit: Payer: Self-pay | Admitting: Gynecology

## 2012-04-27 ENCOUNTER — Other Ambulatory Visit: Payer: Self-pay | Admitting: Gynecology

## 2012-05-25 ENCOUNTER — Ambulatory Visit (HOSPITAL_COMMUNITY)
Admission: RE | Admit: 2012-05-25 | Discharge: 2012-05-25 | Disposition: A | Discharge status: Home / Self Care | Payer: Medicare Other | Source: Ambulatory Visit | Attending: Gynecology | Admitting: Gynecology

## 2012-05-25 DIAGNOSIS — Z1231 Encounter for screening mammogram for malignant neoplasm of breast: Secondary | ICD-10-CM | POA: Insufficient documentation

## 2012-05-28 ENCOUNTER — Other Ambulatory Visit: Payer: Self-pay | Admitting: Gynecology

## 2012-06-07 ENCOUNTER — Ambulatory Visit (INDEPENDENT_AMBULATORY_CARE_PROVIDER_SITE_OTHER): Payer: Medicare Other | Admitting: Gynecology

## 2012-06-07 ENCOUNTER — Encounter: Payer: Self-pay | Admitting: Gynecology

## 2012-06-07 VITALS — BP 124/80 | Ht 62.5 in | Wt 188.0 lb

## 2012-06-07 DIAGNOSIS — N952 Postmenopausal atrophic vaginitis: Secondary | ICD-10-CM

## 2012-06-07 DIAGNOSIS — R21 Rash and other nonspecific skin eruption: Secondary | ICD-10-CM

## 2012-06-07 DIAGNOSIS — J322 Chronic ethmoidal sinusitis: Secondary | ICD-10-CM

## 2012-06-07 DIAGNOSIS — Z79890 Hormone replacement therapy (postmenopausal): Secondary | ICD-10-CM

## 2012-06-07 MED ORDER — FLUCONAZOLE 150 MG PO TABS: ORAL_TABLET | ORAL | Status: DC

## 2012-06-07 MED ORDER — NYSTATIN-TRIAMCINOLONE 100000-0.1 UNIT/GM-% EX OINT: TOPICAL_OINTMENT | CUTANEOUS | Status: DC

## 2012-06-07 MED ORDER — ESTRADIOL 1 MG PO TABS: ORAL_TABLET | ORAL | Status: DC

## 2012-06-07 MED ORDER — AZITHROMYCIN 250 MG PO TABS: ORAL_TABLET | ORAL | Status: DC

## 2012-06-07 MED ORDER — NYSTATIN 100000 UNIT/GM EX POWD: CUTANEOUS | Status: DC

## 2012-06-07 NOTE — Patient Instructions (Addendum)
Follow-up in 1 year, sooner as needed. 

## 2012-06-07 NOTE — Progress Notes (Signed)
Linda Lucas [birth year] [id number]        67 y.o.  G2P2001 for followup exam.  Several issues noted below.  Past medical history,surgical history, medications, allergies, family history and social history were all reviewed and documented in the EPIC chart. ROS:  Was performed and pertinent positives and negatives are included in the history.  Exam: Kim assistant Filed Vitals:   06/07/12 1451  BP: 124/80  Height: 5' 2.5" (1.588 m)  Weight: 188 lb (85.276 kg)   General appearance  Normal Skin grossly normal Head/Neck normal with no cervical or supraclavicular adenopathy thyroid normal Lungs  clear Cardiac RR, without RMG Abdominal  soft, nontender, without masses, organomegaly or hernia. Skin rash in her panniculus skin folds Breasts  examined lying and sitting without masses, retractions, discharge or axillary adenopathy. Pelvic  Ext/BUS/vagina  normal with atrophic changes  Adnexa  Without masses or tenderness    Anus and perineum  normal   Rectovaginal  normal sphincter tone without palpated masses or tenderness.    Assessment/Plan:  67 y.o. G53P2001 female for annual exam.   1. Skin rash. Patient has chronic problem with yeast rash in her panniculus and perineum. Uses Diflucan 150 mg weekly for suppression and nystatin powder and Mytrex cream as needed. I refilled all 3 for her. 2. Sinusitis. 2 day history of periorbital pressure and nasal drainage. No fever chills or headaches. Does have history of same before. Z-Pak prescribed. 3. HRT. Patient continues on estradiol 1 mg daily. I again reviewed the WHI study with increased risk of stroke heart attack DVT and breast cancer. The issues of use in an older woman reviewed. Options to wean versus continuing discussed. I refilled her times a year she will decide if she wants to wean. 4. Atrophic vaginitis. Other than recurrent yeast does not seem to be an issue. She is not sexually active. We'll continue to monitor. 5. Bone health.  DEXA 2013 is normal. Repeat at five-year interval. Increase calcium vitamin D reviewed. 6. Pap smear 2009. No Pap smear done today. No history of abnormal Pap smears. Reviewed current screening guidelines and will stop screening and she is over the age of 8 and status post hysterectomy.  7. History of hyperprolactinemia in the past. Normal MRI 2004. Prolactin last year was 7. Will not repeat but monitor for symptoms. 8. Colonoscopy 2012. Repeat at their recommended interval. 9. Health maintenance. No blood work done as it is all done through her primary physician's office. Follow up one year, sooner as needed.    Dara Lords MD, 4:42 PM 06/07/2012

## 2012-07-23 ENCOUNTER — Other Ambulatory Visit: Payer: Self-pay | Admitting: Gynecology

## 2012-09-20 ENCOUNTER — Other Ambulatory Visit: Payer: Self-pay | Admitting: Internal Medicine

## 2012-09-20 DIAGNOSIS — G959 Disease of spinal cord, unspecified: Secondary | ICD-10-CM

## 2012-09-25 ENCOUNTER — Other Ambulatory Visit: Payer: Self-pay | Admitting: Internal Medicine

## 2012-09-25 DIAGNOSIS — M542 Cervicalgia: Secondary | ICD-10-CM

## 2012-09-26 ENCOUNTER — Other Ambulatory Visit: Payer: Medicare Other

## 2012-09-27 ENCOUNTER — Ambulatory Visit
Admission: RE | Admit: 2012-09-27 | Discharge: 2012-09-27 | Disposition: A | Discharge status: Home / Self Care | Payer: Medicare Other | Source: Ambulatory Visit | Attending: Internal Medicine | Admitting: Internal Medicine

## 2012-09-27 DIAGNOSIS — M542 Cervicalgia: Secondary | ICD-10-CM

## 2012-10-09 ENCOUNTER — Ambulatory Visit: Payer: Medicare Other | Attending: Internal Medicine | Admitting: Physical Therapy

## 2012-10-09 DIAGNOSIS — R5381 Other malaise: Secondary | ICD-10-CM | POA: Insufficient documentation

## 2012-10-09 DIAGNOSIS — M6281 Muscle weakness (generalized): Secondary | ICD-10-CM | POA: Insufficient documentation

## 2012-10-09 DIAGNOSIS — IMO0001 Reserved for inherently not codable concepts without codable children: Secondary | ICD-10-CM | POA: Insufficient documentation

## 2012-10-09 DIAGNOSIS — R262 Difficulty in walking, not elsewhere classified: Secondary | ICD-10-CM | POA: Insufficient documentation

## 2012-10-09 DIAGNOSIS — Z9181 History of falling: Secondary | ICD-10-CM | POA: Insufficient documentation

## 2012-10-15 ENCOUNTER — Ambulatory Visit: Payer: Medicare Other | Admitting: Physical Therapy

## 2012-10-18 ENCOUNTER — Ambulatory Visit: Payer: Medicare Other | Attending: Internal Medicine | Admitting: Physical Therapy

## 2012-10-18 DIAGNOSIS — R5381 Other malaise: Secondary | ICD-10-CM | POA: Insufficient documentation

## 2012-10-18 DIAGNOSIS — IMO0001 Reserved for inherently not codable concepts without codable children: Secondary | ICD-10-CM | POA: Insufficient documentation

## 2012-10-18 DIAGNOSIS — M6281 Muscle weakness (generalized): Secondary | ICD-10-CM | POA: Insufficient documentation

## 2012-10-18 DIAGNOSIS — R262 Difficulty in walking, not elsewhere classified: Secondary | ICD-10-CM | POA: Insufficient documentation

## 2012-10-18 DIAGNOSIS — Z9181 History of falling: Secondary | ICD-10-CM | POA: Insufficient documentation

## 2012-10-22 ENCOUNTER — Ambulatory Visit: Payer: Medicare Other | Admitting: Physical Therapy

## 2012-10-25 ENCOUNTER — Ambulatory Visit: Payer: Medicare Other | Admitting: Rehabilitation

## 2012-10-25 ENCOUNTER — Ambulatory Visit: Payer: Medicare Other | Admitting: Physical Therapy

## 2012-10-29 ENCOUNTER — Ambulatory Visit: Payer: Medicare Other | Admitting: Physical Therapy

## 2012-11-02 ENCOUNTER — Ambulatory Visit: Payer: Medicare Other | Admitting: Physical Therapy

## 2012-11-05 ENCOUNTER — Ambulatory Visit: Payer: Medicare Other | Admitting: Physical Therapy

## 2012-11-08 ENCOUNTER — Ambulatory Visit: Payer: Medicare Other | Admitting: Physical Therapy

## 2012-11-13 ENCOUNTER — Ambulatory Visit: Payer: Medicare Other | Admitting: Physical Therapy

## 2012-11-13 ENCOUNTER — Ambulatory Visit: Payer: Medicare Other | Attending: Internal Medicine | Admitting: Physical Therapy

## 2012-11-13 DIAGNOSIS — IMO0001 Reserved for inherently not codable concepts without codable children: Secondary | ICD-10-CM | POA: Insufficient documentation

## 2012-11-13 DIAGNOSIS — Z9181 History of falling: Secondary | ICD-10-CM | POA: Insufficient documentation

## 2012-11-13 DIAGNOSIS — R262 Difficulty in walking, not elsewhere classified: Secondary | ICD-10-CM | POA: Insufficient documentation

## 2012-11-13 DIAGNOSIS — R5381 Other malaise: Secondary | ICD-10-CM | POA: Insufficient documentation

## 2012-11-13 DIAGNOSIS — M6281 Muscle weakness (generalized): Secondary | ICD-10-CM | POA: Insufficient documentation

## 2012-11-19 ENCOUNTER — Ambulatory Visit: Payer: Medicare Other | Admitting: Physical Therapy

## 2012-11-27 ENCOUNTER — Ambulatory Visit: Payer: Medicare Other | Admitting: Physical Therapy

## 2012-11-30 ENCOUNTER — Ambulatory Visit: Payer: Medicare Other | Admitting: Physical Therapy

## 2012-12-03 ENCOUNTER — Ambulatory Visit: Payer: Medicare Other | Admitting: Physical Therapy

## 2012-12-06 ENCOUNTER — Ambulatory Visit: Payer: Medicare Other | Admitting: Physical Therapy

## 2012-12-10 ENCOUNTER — Ambulatory Visit: Payer: Medicare Other | Admitting: Physical Therapy

## 2012-12-13 ENCOUNTER — Ambulatory Visit: Payer: Medicare Other | Admitting: Physical Therapy

## 2012-12-17 ENCOUNTER — Ambulatory Visit: Payer: Medicare Other | Attending: Internal Medicine | Admitting: Physical Therapy

## 2012-12-17 DIAGNOSIS — R5381 Other malaise: Secondary | ICD-10-CM | POA: Insufficient documentation

## 2012-12-17 DIAGNOSIS — IMO0001 Reserved for inherently not codable concepts without codable children: Secondary | ICD-10-CM | POA: Insufficient documentation

## 2012-12-17 DIAGNOSIS — Z9181 History of falling: Secondary | ICD-10-CM | POA: Insufficient documentation

## 2012-12-17 DIAGNOSIS — R262 Difficulty in walking, not elsewhere classified: Secondary | ICD-10-CM | POA: Insufficient documentation

## 2012-12-17 DIAGNOSIS — M6281 Muscle weakness (generalized): Secondary | ICD-10-CM | POA: Insufficient documentation

## 2012-12-20 ENCOUNTER — Ambulatory Visit: Payer: Medicare Other | Admitting: Physical Therapy

## 2012-12-24 ENCOUNTER — Ambulatory Visit: Payer: Medicare Other | Admitting: Physical Therapy

## 2012-12-27 ENCOUNTER — Ambulatory Visit: Payer: Medicare Other | Admitting: Physical Therapy

## 2012-12-31 ENCOUNTER — Ambulatory Visit: Payer: Medicare Other | Admitting: Physical Therapy

## 2013-01-03 ENCOUNTER — Ambulatory Visit: Payer: Medicare Other | Admitting: Physical Therapy

## 2013-03-05 ENCOUNTER — Ambulatory Visit: Payer: Medicare Other | Admitting: Gynecology

## 2013-04-09 ENCOUNTER — Other Ambulatory Visit: Payer: Self-pay | Admitting: Gynecology

## 2013-05-24 ENCOUNTER — Other Ambulatory Visit: Payer: Self-pay | Admitting: Gynecology

## 2013-05-24 NOTE — Telephone Encounter (Signed)
06/07/12 "Skin rash. Patient has chronic problem with yeast rash in her panniculus and perineum. Uses Diflucan 150 mg weekly for suppression and nystatin powder and Mytrex cream as needed. I refilled all 3 for her."  Patient has yearly exam scheduled for April.

## 2013-06-11 ENCOUNTER — Other Ambulatory Visit (HOSPITAL_COMMUNITY): Payer: Self-pay | Admitting: Internal Medicine

## 2013-06-11 DIAGNOSIS — Z1231 Encounter for screening mammogram for malignant neoplasm of breast: Secondary | ICD-10-CM

## 2013-06-12 ENCOUNTER — Encounter: Payer: Self-pay | Admitting: Gynecology

## 2013-06-12 ENCOUNTER — Ambulatory Visit (INDEPENDENT_AMBULATORY_CARE_PROVIDER_SITE_OTHER): Payer: Medicare Other | Admitting: Gynecology

## 2013-06-12 VITALS — BP 124/76 | Ht 62.0 in | Wt 190.0 lb

## 2013-06-12 DIAGNOSIS — N952 Postmenopausal atrophic vaginitis: Secondary | ICD-10-CM

## 2013-06-12 DIAGNOSIS — Z79890 Hormone replacement therapy (postmenopausal): Secondary | ICD-10-CM

## 2013-06-12 MED ORDER — FLUCONAZOLE 150 MG PO TABS: ORAL_TABLET | ORAL | Status: DC

## 2013-06-12 MED ORDER — ESTRADIOL 1 MG PO TABS: ORAL_TABLET | ORAL | Status: DC

## 2013-06-12 MED ORDER — SULFAMETHOXAZOLE-TMP DS 800-160 MG PO TABS: 1.0000 | ORAL_TABLET | Freq: Two times a day (BID) | ORAL | Status: DC

## 2013-06-12 NOTE — Patient Instructions (Signed)
Continue on the hormone replacement therapy for now. Call me if you have any issues with this. Take Diflucan pill weekly through the summer to help prevent yeast infections. Used the yeast powder/ointment externally as needed. Followup in 1 year, sooner as needed.

## 2013-06-12 NOTE — Progress Notes (Signed)
Zannie KehrJudith A Quale 1945/04/28 [id number]        68 y.o.  G2P2001 for followup exam.  Several issues noted below.  Past medical history,surgical history, problem list, medications, allergies, family history and social history were all reviewed and documented in the EPIC chart.  ROS:  Performed and pertinent positives and negatives are included in the history, assessment and plan .  Exam: Kim assistant Filed Vitals:   06/12/13 1513  BP: 124/76  Height: 5\' 2"  (1.575 m)  Weight: 190 lb (86.183 kg)   General appearance  Normal Skin grossly normal Head/Neck normal with no cervical or supraclavicular adenopathy thyroid normal Lungs  clear Cardiac RR, without RMG Abdominal  soft, nontender, without masses, organomegaly or hernia Breasts  examined lying and sitting without masses, retractions, discharge or axillary adenopathy. Pelvic  Ext/BUS/vagina with generalized atrophic changes.  Adnexa  Without masses or tenderness    Anus and perineum  Normal   Rectovaginal  Normal sphincter tone without palpated masses or tenderness.    Assessment/Plan:  68 y.o. 82P2001 female for annual exam.   1. Skin rash. Patient has chronic problem with yeast rash in her panniculus and perineum. Uses Diflucan 150 mg weekly for suppression and nystatin powder and Mytrex cream as needed. I refilled her Diflucan #4 with 3 refills. She does have some of the powder and Mytrex at home and will call me when she needs a refill for this. 2. HRT. Patient continues on estradiol 1 mg daily. I again reviewed the WHI study with increased risk of stroke heart attack DVT and breast cancer. The issues of use in an older woman reviewed. She currently is going through a bout of depression for which they're adjusting her antidepressant medications. I do not think this would be a good time to consider weaning and she's going to continue on the ERT for now and plan weaning once her emotional state becomes stable. I refilled her estradiol x1  year. 3. Atrophic vaginitis. Overall doing well without significant symptoms. Is not sexually active. 4. DEXA 2013 is normal. Repeat at five-year interval. Increase calcium vitamin D reviewed. 5. Pap smear 2009. No Pap smear done today. No history of abnormal Pap smears. Reviewed current screening guidelines and will stop screening as she is over the age of 68 and status post hysterectomy and she is comfortable with this. 6. History of hyperprolactinemia in the past. Normal MRI 2004. Prolactin 2013 was 7. Will not repeat but monitor for symptoms. 7. Colonoscopy 2012. Repeat at their recommended interval. 8. Health maintenance. No blood work done as it is all done through her primary physician's office. Follow up one year, sooner as needed.   Note: This document was prepared with digital dictation and possible smart phrase technology. Any transcriptional errors that result from this process are unintentional.   Dara LordsFONTAINE,TIMOTHY P MD, 3:59 PM 06/12/2013

## 2013-06-13 ENCOUNTER — Telehealth: Payer: Self-pay | Admitting: *Deleted

## 2013-06-13 ENCOUNTER — Other Ambulatory Visit: Payer: Self-pay | Admitting: *Deleted

## 2013-06-13 LAB — URINALYSIS W MICROSCOPIC + REFLEX CULTURE
BACTERIA UA: NONE SEEN
Bilirubin Urine: NEGATIVE
CASTS: NONE SEEN
Crystals: NONE SEEN
GLUCOSE, UA: 250 mg/dL — AB
Hgb urine dipstick: NEGATIVE
KETONES UR: NEGATIVE mg/dL
Nitrite: NEGATIVE
PH: 5 (ref 5.0–8.0)
Protein, ur: NEGATIVE mg/dL
Specific Gravity, Urine: 1.022 (ref 1.005–1.030)
Urobilinogen, UA: 0.2 mg/dL (ref 0.0–1.0)

## 2013-06-13 NOTE — Telephone Encounter (Signed)
Prior authorization form filled out and faxed to [facility]  For estradiol 1 mg, will wait for response.

## 2013-06-14 ENCOUNTER — Ambulatory Visit (HOSPITAL_COMMUNITY)
Admission: RE | Admit: 2013-06-14 | Discharge: 2013-06-14 | Disposition: A | Discharge status: Home / Self Care | Payer: Medicare Other | Source: Ambulatory Visit | Attending: Internal Medicine | Admitting: Internal Medicine

## 2013-06-14 DIAGNOSIS — Z1231 Encounter for screening mammogram for malignant neoplasm of breast: Secondary | ICD-10-CM | POA: Insufficient documentation

## 2013-06-14 LAB — URINE CULTURE

## 2013-06-17 NOTE — Telephone Encounter (Signed)
rx approved thought 06/14/14

## 2013-06-18 ENCOUNTER — Other Ambulatory Visit: Payer: Self-pay | Admitting: Internal Medicine

## 2013-06-18 DIAGNOSIS — R928 Other abnormal and inconclusive findings on diagnostic imaging of breast: Secondary | ICD-10-CM

## 2013-06-27 ENCOUNTER — Other Ambulatory Visit: Payer: Self-pay

## 2013-06-28 ENCOUNTER — Ambulatory Visit
Admission: RE | Admit: 2013-06-28 | Discharge: 2013-06-28 | Disposition: A | Discharge status: Home / Self Care | Payer: Medicare Other | Source: Ambulatory Visit | Attending: Internal Medicine | Admitting: Internal Medicine

## 2013-06-28 DIAGNOSIS — R928 Other abnormal and inconclusive findings on diagnostic imaging of breast: Secondary | ICD-10-CM

## 2013-07-04 ENCOUNTER — Telehealth: Payer: Self-pay | Admitting: *Deleted

## 2013-07-04 NOTE — Telephone Encounter (Signed)
(  pt aware you are out of the office) Pt called stating on OV 06/12/13 a discussion about premarin cream was going to be e-scribed to pharmacy? I don't see anything in OV note about this. Please advise

## 2013-07-08 MED ORDER — ESTROGENS, CONJUGATED 0.625 MG/GM VA CREA: TOPICAL_CREAM | VAGINAL | Status: DC

## 2013-07-08 NOTE — Telephone Encounter (Signed)
If patient feels additional estrogen to the vagina would help with vaginal dryness then okay to prescribe Premarin vaginal cream applied twice weekly dispense one tube refill x5

## 2013-07-08 NOTE — Telephone Encounter (Signed)
Pt informed, rx sent 

## 2013-07-30 ENCOUNTER — Other Ambulatory Visit: Payer: Self-pay | Admitting: Gynecology

## 2013-09-19 ENCOUNTER — Other Ambulatory Visit: Payer: Self-pay | Admitting: Gynecology

## 2013-09-19 NOTE — Telephone Encounter (Signed)
At her 06/12/2013 CE you wrote "Skin rash. Patient has chronic problem with yeast rash in her panniculus and perineum. Uses Diflucan 150 mg weekly for suppression and nystatin powder and Mytrex cream as needed. I refilled her Diflucan #4 with 3 refills. She does have some of the powder and Mytrex at home and will call me when she needs a refill for this."

## 2013-10-15 DIAGNOSIS — R269 Unspecified abnormalities of gait and mobility: Secondary | ICD-10-CM | POA: Insufficient documentation

## 2013-10-15 DIAGNOSIS — Z9181 History of falling: Secondary | ICD-10-CM | POA: Insufficient documentation

## 2013-11-08 ENCOUNTER — Ambulatory Visit (INDEPENDENT_AMBULATORY_CARE_PROVIDER_SITE_OTHER): Payer: Medicare Other | Admitting: General Surgery

## 2013-11-11 ENCOUNTER — Ambulatory Visit: Payer: Medicare Other | Admitting: Neurology

## 2013-12-05 ENCOUNTER — Ambulatory Visit (INDEPENDENT_AMBULATORY_CARE_PROVIDER_SITE_OTHER): Payer: Medicare Other | Admitting: General Surgery

## 2013-12-20 ENCOUNTER — Encounter: Payer: Self-pay | Admitting: Gynecology

## 2013-12-20 ENCOUNTER — Ambulatory Visit (INDEPENDENT_AMBULATORY_CARE_PROVIDER_SITE_OTHER): Payer: Medicare Other | Admitting: Gynecology

## 2013-12-20 DIAGNOSIS — N898 Other specified noninflammatory disorders of vagina: Secondary | ICD-10-CM

## 2013-12-20 DIAGNOSIS — B373 Candidiasis of vulva and vagina: Secondary | ICD-10-CM

## 2013-12-20 DIAGNOSIS — B3731 Acute candidiasis of vulva and vagina: Secondary | ICD-10-CM

## 2013-12-20 LAB — WET PREP FOR TRICH, YEAST, CLUE
Clue Cells Wet Prep HPF POC: NONE SEEN
TRICH WET PREP: NONE SEEN

## 2013-12-20 MED ORDER — TERCONAZOLE 0.4 % VA CREA: 1.0000 | TOPICAL_CREAM | Freq: Every day | VAGINAL | Status: DC

## 2013-12-20 NOTE — Patient Instructions (Signed)
Use the Terazol cream nightly for 7 days and then one applicator each week for 2 months. Follow up if any issues.

## 2013-12-20 NOTE — Progress Notes (Signed)
Linda KehrJudith A Vorce October 04, 1945 [id number]        68 y.o.  G2P2001 Presents with vaginal irritation for several weeks. Patient being treated for chronic yeast vulvovaginitis with Diflucan once weekly. Seems to have helped but most recently seems to have had an exacerbation. No odor or urinary symptoms such as frequency dysuria urgency. Also is having some issues with yeast within the folds of her groin.  Past medical history,surgical history, problem list, medications, allergies, family history and social history were all reviewed and documented in the EPIC chart.  Directed ROS with pertinent positives and negatives documented in the history of present illness/assessment and plan.  Exam: Kim assistant General appearance:  Normal External BUS vagina with atrophic changes. White discharge noted.  Mild rash in both groin regions consistent with fungal.  Assessment/Plan:  68 y.o. G2P2001 with history, exam and wet prep consistent with yeast. We'll treat with Terazol 7 day cream internally as well as externally to affected areas. A separate prescription given to use one applicator weekly for 2 months to see if we can prophylax against this. She'll hold on her oral Diflucan for now. Follow up if symptoms persist, worsen or recur.  I also signed a permanent handicap plate for her car due to her issues of degenerative disc disease, osteoarthritis ankles/metatarsal fractures and fibromyalgia significantly limiting her mobility.     Dara LordsFONTAINE,Alaysia Lightle P MD, 4:26 PM 12/20/2013

## 2013-12-23 ENCOUNTER — Other Ambulatory Visit: Payer: Self-pay | Admitting: Gynecology

## 2014-01-13 ENCOUNTER — Encounter: Payer: Self-pay | Admitting: Gynecology

## 2014-01-22 ENCOUNTER — Telehealth: Payer: Self-pay | Admitting: *Deleted

## 2014-01-22 NOTE — Telephone Encounter (Signed)
Pt called asking if you would be willing to refer her to a plastic surgeon for issues with yeast within the folds of her groin.? Please advise

## 2014-01-22 NOTE — Telephone Encounter (Signed)
Linda Lucas

## 2014-01-23 NOTE — Telephone Encounter (Signed)
Left message for pt to call.

## 2014-01-29 NOTE — Telephone Encounter (Signed)
Appointment on 2044/08/13 @ 10:15 am with Dr.Holderness pt informed.

## 2014-01-30 NOTE — Telephone Encounter (Signed)
Pt informed with the below note. 

## 2014-01-30 NOTE — Telephone Encounter (Signed)
Error below appointment on 02/05/14

## 2014-02-03 ENCOUNTER — Other Ambulatory Visit: Payer: Self-pay | Admitting: Gynecology

## 2014-02-11 NOTE — Telephone Encounter (Signed)
Late entry: Dr.Holderness office called and said they don't accept pt insurance medicare.  So she gave me the name of Dr.Claire Sanger appointment scheduled on 02/12/14 @ 2:00pm notes faxed, pt informed.

## 2014-02-13 DIAGNOSIS — E881 Lipodystrophy, not elsewhere classified: Secondary | ICD-10-CM | POA: Insufficient documentation

## 2014-03-18 ENCOUNTER — Ambulatory Visit: Payer: Medicare Other | Admitting: *Deleted

## 2014-05-05 ENCOUNTER — Encounter: Payer: Self-pay | Admitting: Physical Therapy

## 2014-05-05 ENCOUNTER — Ambulatory Visit: Payer: Medicare Other | Attending: Internal Medicine | Admitting: Physical Therapy

## 2014-05-05 DIAGNOSIS — R262 Difficulty in walking, not elsewhere classified: Secondary | ICD-10-CM | POA: Diagnosis not present

## 2014-05-05 DIAGNOSIS — R2689 Other abnormalities of gait and mobility: Secondary | ICD-10-CM | POA: Insufficient documentation

## 2014-05-05 DIAGNOSIS — R5381 Other malaise: Secondary | ICD-10-CM | POA: Insufficient documentation

## 2014-05-05 NOTE — Therapy (Signed)
[facility] [address] [place], [place], [address] Phone: [phone]   Fax:  [phone]  Physical Therapy Evaluation  Patient Details  Name: Linda Lucas MRN: [id number] Date of Birth: March 25, 1945 Referring Provider:  Kristie CowmanSchooler, Karen, MD  Encounter Date: 05/05/2014      PT End of Session - 05/05/14 1416    Visit Number 1   Number of Visits 16   Date for PT Re-Evaluation 07/04/14   PT Start Time 1350      Past Medical History  Diagnosis Date  . Migraine   . Fibromyalgia   . Metatarsal fracture     spiral fx left 5th metatarsal with non union requiring casting x 6 mos and electromagnetic therapy. 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 Diabetes mellitus 2004  . Ankle fracture     bimeolar fracture  . Osteoarthritis   . IBS (irritable bowel syndrome)   . Depression   . Chronic urethritis     frequent uti's and or urethritis  . Degenerative disc disease, lumbar 2007    bulging disc  . Hypercholesteremia   . Prolactin increased 05/2002    NORMAL MRI  . Overactive bladder   . Glaucoma     Past Surgical History  Procedure Laterality Date  . Tonsillectomy and adenoidectomy    . Appendectomy  age 69  . Hemorroidectomy  age 69  . Breast biopsy      x 2  . Tubal ligation    . Hysteroscopy      D&C,HYSTEROSCOPY FOR PED FIBROID   . Abdominal hysterectomy    . Cholecystectomy open      WITH CDE  . Fracture metarsal    . Burch procedure    . Right ankle fracture    . Carpectomy hand      LEFT FOR KEINBACH'S DZ  . Fusion of discs      C 3-4 4-5 5-6 VERTBRAES IN NECK DUE TO DEGEN. DISC DZ  . Cystoscopy      X 4  . Fusion of left wrist with plate and screws  2009    CADAVER BONE GRAFT AND APPLICATION OF HUMAN GROWTH FACTORS  . Orif radial head / neck fracture  2010    RIGHT RADIAL HED EXTENDING INTO JOINT SPACE WITH PLATE AND SCREWS  . Interstim implant  placement      There were no vitals taken for this visit.  Visit Diagnosis:  Difficulty walking - Plan: PT plan of care cert/re-cert  Debility - Plan: PT plan of care cert/re-cert      Subjective Assessment - 05/05/14 1356    Symptoms Patient reports that recently she has been having increased difficulty getting up from sitting and going up and down the stair.  She "feels weak in the legs", has a history of falls   Pertinent History history of falls, cervical fusion in past, has a long medication list that could increase risk for falls   Limitations Sitting   How long can you stand comfortably? 5-10 minutes   How long can you walk comfortably? some difficulty shopping   Patient Stated Goals to go up and down the stairs, get up from sitting without difficulty   Currently in Pain? Yes   Pain Score 5    Pain Location Other (Comment)   Pain Orientation Other (Comment)   Pain Descriptors / Indicators Aching;Sore;Nagging   Pain Type Chronic  pain   Pain Radiating Towards C/O pain "all over"   Pain Onset More than a month ago   Pain Frequency Constant   Aggravating Factors  activity, reports that if she is busy one day she will not be able to do anything the following day   Pain Relieving Factors rest   Effect of Pain on Daily Activities limited          [facility] PT Assessment - 05/05/14 0001    Assessment   Medical Diagnosis debility   Onset Date 04/22/14   Prior Therapy about a year ago   Precautions   Precaution Comments at risk for falls   Restrictions   Weight Bearing Restrictions No   Balance Screen   Has the patient fallen in the past 6 months Yes   How many times? 5+   Has the patient had a decrease in activity level because of a fear of falling?  Yes   Is the patient reluctant to leave their home because of a fear of falling?  Yes   Home Environment   Living Enviornment Private residence   Living Arrangements Spouse/significant other   Type of Home House   Home  Access Stairs to enter   Prior Function   Leisure has some equipment to exercise with but has not tried   ROM / Strength   AROM / PROM / Strength Strength   Strength   Overall Strength --  Strength was 3+/5 for the hips and the knees   Palpation   Palpation she is very tender and tight in the right ITB   Transfers   Transfers --  has to use hands to get up from sitting   Ambulation/Gait   Ambulation/Gait --  no assistive device, slow   Gait Comments Timed up and go test 19 seconds     We saw her over a year ago here and at that time she had about 20+ falls in a 6 month period, but at that time she seemed to be overmedicated, now much mor lucid.             [facility] Adult PT Treatment/Exercise - 05/05/14 0001    Lumbar Exercises: Aerobic   Stationary Bike Nu step L5 5 minutes   Lumbar Exercises: Machines for Strengthening   Cybex Knee Extension 5# 2x10   Cybex Knee Flexion 20# 2x10                  PT Short Term Goals - 05/05/14 1418    PT SHORT TERM GOAL #1   Title indepednent with HEP   Time 2   Period Weeks   Status New           PT Long Term Goals - 05/05/14 1419    PT LONG TERM GOAL #1   Title independent with fall prevention   Time 8   Period Weeks   Status New   PT LONG TERM GOAL #2   Title decrease timed up and go test to 14 seconds   Time 8   Period Weeks   Status New   PT LONG TERM GOAL #3   Title no falls over a 4 week period   Time 8   Period Weeks   Status New   PT LONG TERM GOAL #4   Title increase strength of the LE's to 4/5   Time 8   Period Weeks   Status New   PT LONG TERM GOAL #5  Title get up from sitting without difficulty   Time 8   Period Weeks   Status New               Plan - May 12, 2014 1417    Clinical Impression Statement Patient reports at least 5 falls over the past 6 months, reports that she has been having increased difficulty with getting up from sitting and with stairs, reports that she feels  weak in her legs   Pt will benefit from skilled therapeutic intervention in order to improve on the following deficits Abnormal gait;Decreased activity tolerance;Decreased balance;Difficulty walking;Decreased strength;Impaired flexibility;Pain   Rehab Potential Good   PT Frequency 2x / week   PT Duration 8 weeks   PT Treatment/Interventions Moist Heat;Electrical Stimulation;Gait training;Balance training;Therapeutic activities;Therapeutic exercise;Patient/family education   PT Next Visit Plan add exercises   Consulted and Agree with Plan of Care Patient          G-Codes - 05-12-2014 1431    Functional Assessment Tool Used FOTO   Functional Limitation Mobility: Walking and moving around   Mobility: Walking and Moving Around Current Status [phone]) At least 40 percent but less than 60 percent impaired, limited or restricted   Mobility: Walking and Moving Around Goal Status [phone]) At least 40 percent but less than 60 percent impaired, limited or restricted       Problem List Patient Active Problem List   Diagnosis Date Noted  . Overactive bladder   . Migraine   . Fibromyalgia   . Chronic fatigue syndrome   . Metatarsal fracture   . Diabetes mellitus   . Ankle fracture   . Osteoarthritis   . IBS (irritable bowel syndrome)   . Depression   . Chronic urethritis   . Degenerative disc disease, lumbar   . Hypercholesteremia   . Prolactin increased     Jearld Lesch, PT May 12, 2014, 2:35 PM  [facility] [address] [place], [place], [address] Phone: [phone]   Fax:  [phone]

## 2014-05-15 ENCOUNTER — Ambulatory Visit: Payer: Medicare Other | Admitting: Physical Therapy

## 2014-05-20 ENCOUNTER — Ambulatory Visit: Payer: Medicare Other | Admitting: Physical Therapy

## 2014-05-22 ENCOUNTER — Ambulatory Visit: Payer: Medicare Other | Attending: Internal Medicine | Admitting: Physical Therapy

## 2014-05-22 DIAGNOSIS — R262 Difficulty in walking, not elsewhere classified: Secondary | ICD-10-CM | POA: Insufficient documentation

## 2014-05-22 DIAGNOSIS — R5381 Other malaise: Secondary | ICD-10-CM | POA: Diagnosis not present

## 2014-05-22 DIAGNOSIS — R2689 Other abnormalities of gait and mobility: Secondary | ICD-10-CM | POA: Insufficient documentation

## 2014-05-22 NOTE — Therapy (Signed)
[facility] [address] [place], [place], [address] Phone: [phone]   Fax:  [phone]  Physical Therapy Treatment  Patient Details  Name: Linda KehrJudith A Lucas MRN: [id number] Date of Birth: 02-25-1946 Referring Provider:  Kristie CowmanSchooler, Karen, MD  Encounter Date: 05/22/2014      PT End of Session - 05/22/14 1415    Visit Number 2   Number of Visits 16   Date for PT Re-Evaluation 07/04/14   PT Start Time 1331   PT Stop Time 1418   PT Time Calculation (min) 47 min      Past Medical History  Diagnosis Date  . Migraine   . Fibromyalgia   . Metatarsal fracture     spiral fx left 5th metatarsal with non union requiring casting x 6 mos and electromagnetic therapy. 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 Diabetes mellitus 2004  . Ankle fracture     bimeolar fracture  . Osteoarthritis   . IBS (irritable bowel syndrome)   . Depression   . Chronic urethritis     frequent uti's and or urethritis  . Degenerative disc disease, lumbar 2007    bulging disc  . Hypercholesteremia   . Prolactin increased 05/2002    NORMAL MRI  . Overactive bladder   . Glaucoma     Past Surgical History  Procedure Laterality Date  . Tonsillectomy and adenoidectomy    . Appendectomy  age 69  . Hemorroidectomy  age 69  . Breast biopsy      x 2  . Tubal ligation    . Hysteroscopy      D&C,HYSTEROSCOPY FOR PED FIBROID   . Abdominal hysterectomy    . Cholecystectomy open      WITH CDE  . Fracture metarsal    . Burch procedure    . Right ankle fracture    . Carpectomy hand      LEFT FOR KEINBACH'S DZ  . Fusion of discs      C 3-4 4-5 5-6 VERTBRAES IN NECK DUE TO DEGEN. DISC DZ  . Cystoscopy      X 4  . Fusion of left wrist with plate and screws  2009    CADAVER BONE GRAFT AND APPLICATION OF HUMAN GROWTH FACTORS  . Orif radial head / neck fracture  2010    RIGHT RADIAL HED EXTENDING INTO  JOINT SPACE WITH PLATE AND SCREWS  . Interstim implant placement      There were no vitals filed for this visit.  Visit Diagnosis:  Difficulty walking  Debility                     [facility] Adult [facility] Treatment/Exercise - 05/22/14 0001    High Level Balance   High Level Balance Activities Side stepping;Backward walking;Direction changes;Turns;Tandem walking   Lumbar Exercises: Aerobic   Stationary Bike Nu step L5 5 minutes   Lumbar Exercises: Machines for Strengthening   Cybex Knee Extension 5# 2x10   Cybex Knee Flexion 20# 2x10   Other Lumbar Machine Exercise 2# marches, hip abduction   Knee/Hip Exercises: Standing   Heel Raises 2 sets;10 reps   Hip ADduction 2 sets;10 reps   Other Standing Knee Exercises standing hip flexion   Other Standing Knee Exercises yellow tband ankle DF,                   PT Short Term  Goals - 05/05/14 1418    PT SHORT TERM GOAL #1   Title indepednent with HEP   Time 2   Period Weeks   Status New           PT Long Term Goals - 05/05/14 1419    PT LONG TERM GOAL #1   Title independent with fall prevention   Time 8   Period Weeks   Status New   PT LONG TERM GOAL #2   Title decrease timed up and go test to 14 seconds   Time 8   Period Weeks   Status New   PT LONG TERM GOAL #3   Title no falls over a 4 week period   Time 8   Period Weeks   Status New   PT LONG TERM GOAL #4   Title increase strength of the LE's to 4/5   Time 8   Period Weeks   Status New   PT LONG TERM GOAL #5   Title get up from sitting without difficulty   Time 8   Period Weeks   Status New               Plan - 05/22/14 1416    Clinical Impression Statement C/O soreness in the arms, reports no falls, tried some exercises at home but unable   Pt will benefit from skilled therapeutic intervention in order to improve on the following deficits Abnormal gait;Decreased activity tolerance;Decreased balance;Difficulty walking;Decreased  strength;Impaired flexibility;Pain   Rehab Potential Good   PT Frequency 2x / week   PT Duration 8 weeks   PT Treatment/Interventions Moist Heat;Electrical Stimulation;Gait training;Balance training;Therapeutic activities;Therapeutic exercise;Patient/family education   PT Next Visit Plan add exercises   Consulted and Agree with Plan of Care Patient        Problem List Patient Active Problem List   Diagnosis Date Noted  . Overactive bladder   . Migraine   . Fibromyalgia   . Chronic fatigue syndrome   . Metatarsal fracture   . Diabetes mellitus   . Ankle fracture   . Osteoarthritis   . IBS (irritable bowel syndrome)   . Depression   . Chronic urethritis   . Degenerative disc disease, lumbar   . Hypercholesteremia   . Prolactin increased     Jearld Lesch, PT 05/22/2014, 2:18 PM  [facility] [address] [place], [place], [address] Phone: [phone]   Fax:  [phone]

## 2014-05-28 ENCOUNTER — Encounter: Payer: Self-pay | Admitting: Physical Therapy

## 2014-05-28 ENCOUNTER — Ambulatory Visit: Payer: Medicare Other | Admitting: Physical Therapy

## 2014-05-28 DIAGNOSIS — R2689 Other abnormalities of gait and mobility: Secondary | ICD-10-CM | POA: Diagnosis not present

## 2014-05-28 DIAGNOSIS — R262 Difficulty in walking, not elsewhere classified: Secondary | ICD-10-CM

## 2014-05-28 DIAGNOSIS — R5381 Other malaise: Secondary | ICD-10-CM

## 2014-05-28 NOTE — Therapy (Signed)
**Note DeLindaIdentified via Obfuscation** [address] [place], [place], [address] Phone: [phone]   Fax:  [phone]  Physical Therapy Treatment  Patient Details  Name: Linda Lucas MRN: [id number] Date of Birth: 12/15/1945 Referring Provider:  Dorian Heckle, MD  Encounter Date: 05/28/2014      PT End of Session - 05/28/14 1553    Visit Number 3   Date for PT ReLindaEvaluation 07/04/14   PT Start Time 1430   PT Stop Time 1510   PT Time Calculation (min) 40 min      Past Medical History  Diagnosis Date  . Migraine   . Fibromyalgia   . Metatarsal fracture     spiral fx left 5th metatarsal with non union requiring casting x 6 mos and electromagnetic therapy. 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 Diabetes mellitus 2004  . Ankle fracture     bimeolar fracture  . Osteoarthritis   . IBS (irritable bowel syndrome)   . Depression   . Chronic urethritis     frequent uti's and or urethritis  . Degenerative disc disease, lumbar 2007    bulging disc  . Hypercholesteremia   . Prolactin increased 05/2002    NORMAL MRI  . Overactive bladder   . Glaucoma     Past Surgical History  Procedure Laterality Date  . Tonsillectomy and adenoidectomy    . Appendectomy  age 30  . Hemorroidectomy  age 65  . Breast biopsy      x 2  . Tubal ligation    . Hysteroscopy      D&C,HYSTEROSCOPY FOR PED FIBROID   . Abdominal hysterectomy    . Cholecystectomy open      WITH CDE  . Fracture metarsal    . Burch procedure    . Right ankle fracture    . Carpectomy hand      LEFT FOR KEINBACH'S DZ  . Fusion of discs      C 3Linda4 4Linda5 5Linda6 VERTBRAES IN NECK DUE TO DEGEN. [place] DZ  . Cystoscopy      X 4  . Fusion of left wrist with plate and screws  4656    CADAVER BONE GRAFT AND APPLICATION OF HUMAN GROWTH FACTORS  . Orif radial head / neck fracture  2010    RIGHT RADIAL HED EXTENDING INTO JOINT SPACE WITH PLATE AND  SCREWS  . Interstim implant placement      There were no vitals filed for this visit.  Visit Diagnosis:  Difficulty walking  Debility          [facility] PT Assessment - 05/28/14 0001    Ambulation/Gait   Gait Comments 18 seconds                   [facility] Adult PT Treatment/Exercise - 05/28/14 0001    High Level Balance   High Level Balance Activities Side stepping;Backward walking;Direction changes;Turns;Tandem walking   High Level Balance Comments also used the Airex for standing balance, she had difficulty with this   Lumbar Exercises: Aerobic   Stationary Bike Nu step L5 5 minutes   Lumbar Exercises: Machines for Strengthening   Cybex Knee Extension 5# 2x10   Cybex Knee Flexion 20# 2x10   Other Lumbar Machine Exercise 2# marches, hip abduction   Other Lumbar Machine Exercise ball b/n knees squeeze   Knee/Hip Exercises: Standing   Other Standing Knee Exercises yellow tband ankle  DF,                 PT Education - 05/28/14 1553    Education provided Yes   Education Details spoke with her about doing some side stepping and tandem gait at home with her husband   Person(s) Educated Patient;Spouse   Methods Explanation;Demonstration   Comprehension Verbalized understanding          PT Short Term Goals - 05/05/14 1418    PT SHORT TERM GOAL #1   Title indepednent with HEP   Time 2   Period Weeks   Status New           PT Long Term Goals - 05/28/14 1555    PT LONG TERM GOAL #1   Title independent with fall prevention   Status OnLindagoing   PT LONG TERM GOAL #2   Title decrease timed up and go test to 14 seconds   Status Partially Met   PT LONG TERM GOAL #3   Title no falls over a 4 week period   Status OnLindagoing               Plan - 05/28/14 1554    Clinical Impression Statement Getting stronger and smoother with gait, decreased her TUG time, still some issues with higher level balance especially on uneven surfaces   Pt will benefit  from skilled therapeutic intervention in order to improve on the following deficits Abnormal gait;Decreased activity tolerance;Decreased balance;Difficulty walking;Decreased strength;Impaired flexibility;Pain   Rehab Potential Good   PT Frequency 2x / week   PT Duration 8 weeks   PT Treatment/Interventions Moist Heat;Electrical Stimulation;Gait training;Balance training;Therapeutic activities;Therapeutic exercise;Patient/family education        Problem List Patient Active Problem List   Diagnosis Date Noted  . Overactive bladder   . Migraine   . Fibromyalgia   . Chronic fatigue syndrome   . Metatarsal fracture   . Diabetes mellitus   . Ankle fracture   . Osteoarthritis   . IBS (irritable bowel syndrome)   . Depression   . Chronic urethritis   . Degenerative disc disease, lumbar   . Hypercholesteremia   . Prolactin increased     Sumner Boast, PT 05/28/2014, 3:56 PM  [address] [place], [place], [address] Phone: [phone]   Fax:  [phone]

## 2014-05-30 ENCOUNTER — Other Ambulatory Visit: Payer: Self-pay | Admitting: Gynecology

## 2014-05-30 DIAGNOSIS — Z1231 Encounter for screening mammogram for malignant neoplasm of breast: Secondary | ICD-10-CM

## 2014-06-11 ENCOUNTER — Encounter: Payer: Self-pay | Admitting: Physical Therapy

## 2014-06-11 ENCOUNTER — Ambulatory Visit: Payer: Medicare Other | Admitting: Physical Therapy

## 2014-06-11 DIAGNOSIS — R262 Difficulty in walking, not elsewhere classified: Secondary | ICD-10-CM

## 2014-06-11 DIAGNOSIS — R2689 Other abnormalities of gait and mobility: Secondary | ICD-10-CM | POA: Diagnosis not present

## 2014-06-11 DIAGNOSIS — R5381 Other malaise: Secondary | ICD-10-CM

## 2014-06-11 NOTE — Therapy (Signed)
[facility] [address] [place], [place], [address] Phone: [phone]   Fax:  [phone]  Physical Therapy Treatment  Patient Details  Name: Zannie KehrJudith A Ferriss MRN: [id number] Date of Birth: [birth year] Referring Provider:  Kristie CowmanSchooler, Karen, MD  Encounter Date: 06/11/2014      PT End of Session - 06/11/14 1511    Visit Number 4   Date for PT Re-Evaluation 07/04/14   PT Start Time 1430   PT Stop Time 1530   PT Time Calculation (min) 60 min      Past Medical History  Diagnosis Date  . Migraine   . Fibromyalgia   . Metatarsal fracture     spiral fx left 5th metatarsal with non union requiring casting x 6 mos and electromagnetic therapy. 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 Diabetes mellitus 2004  . Ankle fracture     bimeolar fracture  . Osteoarthritis   . IBS (irritable bowel syndrome)   . Depression   . Chronic urethritis     frequent uti's and or urethritis  . Degenerative disc disease, lumbar 2007    bulging disc  . Hypercholesteremia   . Prolactin increased 05/2002    NORMAL MRI  . Overactive bladder   . Glaucoma     Past Surgical History  Procedure Laterality Date  . Tonsillectomy and adenoidectomy    . Appendectomy  age 69  . Hemorroidectomy  age 69  . Breast biopsy      x 2  . Tubal ligation    . Hysteroscopy      D&C,HYSTEROSCOPY FOR PED FIBROID   . Abdominal hysterectomy    . Cholecystectomy open      WITH CDE  . Fracture metarsal    . Burch procedure    . Right ankle fracture    . Carpectomy hand      LEFT FOR KEINBACH'S DZ  . Fusion of discs      C 3-4 4-5 5-6 VERTBRAES IN NECK DUE TO DEGEN. DISC DZ  . Cystoscopy      X 4  . Fusion of left wrist with plate and screws  2009    CADAVER BONE GRAFT AND APPLICATION OF HUMAN GROWTH FACTORS  . Orif radial head / neck fracture  2010    RIGHT RADIAL HED EXTENDING INTO JOINT SPACE WITH PLATE AND  SCREWS  . Interstim implant placement      There were no vitals filed for this visit.  Visit Diagnosis:  Difficulty walking  Debility      Subjective Assessment - 06/11/14 1435    Symptoms Patient reports that she feels she is doing better, she is "trying to exercise at home".  Still with pain.   Pertinent History history of falls, cervical fusion in past, has a long medication list that could increase risk for falls   Patient Stated Goals to go up and down the stairs, get up from sitting without difficulty   Currently in Pain? Yes   Pain Score 6    Pain Location Shoulder   Pain Orientation Left;Right   Pain Descriptors / Indicators Aching;Sore   Pain Type Chronic pain   Pain Onset More than a month ago   Pain Frequency Constant   Aggravating Factors  any motions   Pain Relieving Factors rest   Effect of Pain on Daily Activities limited with everything  [facility] Adult PT Treatment/Exercise - 06/11/14 0001    High Level Balance   High Level Balance Activities Side stepping;Backward walking;Direction changes;Turns;Tandem walking   High Level Balance Comments also used the Airex for standing balance, she had difficulty with this   Lumbar Exercises: Aerobic   Stationary Bike Nu step L5 6 minutes   Lumbar Exercises: Machines for Strengthening   Cybex Knee Extension 5# 3x10   Cybex Knee Flexion 20# 3x10   Other Lumbar Machine Exercise 3# marches, hip abduction   Other Lumbar Machine Exercise ball b/n knees squeeze   Knee/Hip Exercises: Standing   Heel Raises 2 sets;10 reps   Hip ADduction 2 sets;10 reps   Other Standing Knee Exercises yellow tband ankle DF,    Modalities   Modalities Electrical Stimulation   Electrical Stimulation   Electrical Stimulation Location right shoulder   Electrical Stimulation Parameters IFC   Electrical Stimulation Goals Pain                  PT Short Term Goals - 05/05/14 1418    PT SHORT TERM GOAL  #1   Title indepednent with HEP   Time 2   Period Weeks   Status New           PT Long Term Goals - 06/11/14 1513    PT LONG TERM GOAL #4   Title increase strength of the LE's to 4/5   Status On-going               Plan - 06/11/14 1512    Clinical Impression Statement She is doing much better with her ability to have less issues with balance   Pt will benefit from skilled therapeutic intervention in order to improve on the following deficits Abnormal gait;Decreased activity tolerance;Decreased balance;Difficulty walking;Decreased strength;Impaired flexibility;Pain   Rehab Potential Good   PT Frequency 2x / week   PT Duration 6 weeks   PT Treatment/Interventions Moist Heat;Electrical Stimulation;Gait training;Balance training;Therapeutic activities;Therapeutic exercise;Patient/family education   PT Next Visit Plan see if estim helps shoulder pain   Consulted and Agree with Plan of Care Patient        Problem List Patient Active Problem List   Diagnosis Date Noted  . Overactive bladder   . Migraine   . Fibromyalgia   . Chronic fatigue syndrome   . Metatarsal fracture   . Diabetes mellitus   . Ankle fracture   . Osteoarthritis   . IBS (irritable bowel syndrome)   . Depression   . Chronic urethritis   . Degenerative disc disease, lumbar   . Hypercholesteremia   . Prolactin increased     Jearld Lesch, PT 06/11/2014, 3:14 PM  [facility] [address] [place], [place], [address] Phone: [phone]   Fax:  [phone]

## 2014-06-17 ENCOUNTER — Other Ambulatory Visit: Payer: Self-pay | Admitting: Gastroenterology

## 2014-06-18 ENCOUNTER — Ambulatory Visit (INDEPENDENT_AMBULATORY_CARE_PROVIDER_SITE_OTHER): Payer: Medicare Other | Admitting: Gynecology

## 2014-06-18 ENCOUNTER — Encounter: Payer: Self-pay | Admitting: Physical Therapy

## 2014-06-18 ENCOUNTER — Ambulatory Visit: Payer: Medicare Other | Attending: Internal Medicine | Admitting: Physical Therapy

## 2014-06-18 ENCOUNTER — Encounter: Payer: Self-pay | Admitting: Gynecology

## 2014-06-18 VITALS — BP 122/78 | Ht 64.0 in | Wt 198.0 lb

## 2014-06-18 DIAGNOSIS — R262 Difficulty in walking, not elsewhere classified: Secondary | ICD-10-CM | POA: Insufficient documentation

## 2014-06-18 DIAGNOSIS — N952 Postmenopausal atrophic vaginitis: Secondary | ICD-10-CM

## 2014-06-18 DIAGNOSIS — Z79890 Hormone replacement therapy (postmenopausal): Secondary | ICD-10-CM

## 2014-06-18 DIAGNOSIS — Z01419 Encounter for gynecological examination (general) (routine) without abnormal findings: Secondary | ICD-10-CM | POA: Diagnosis not present

## 2014-06-18 DIAGNOSIS — R5381 Other malaise: Secondary | ICD-10-CM | POA: Insufficient documentation

## 2014-06-18 DIAGNOSIS — R2689 Other abnormalities of gait and mobility: Secondary | ICD-10-CM | POA: Insufficient documentation

## 2014-06-18 MED ORDER — ESTRADIOL 1 MG PO TABS: ORAL_TABLET | ORAL | Status: DC

## 2014-06-18 NOTE — Progress Notes (Signed)
Linda KehrJudith A Lucas 1945-06-29 [id number]        69 y.o.  G2P2001 for breast and pelvic exam. Several issues noted below.  Past medical history,surgical history, problem list, medications, allergies, family history and social history were all reviewed and documented as reviewed in the EPIC chart.  ROS:  Performed with pertinent positives and negatives included in the history, assessment and plan.   Additional significant findings :  none   Exam: [facility] personassistant Filed Vitals:   06/18/14 1510  BP: 122/78  Height: 5\' 4"  (1.626 m)  Weight: 198 lb (89.812 kg)   General appearance:  Normal affect, orientation and appearance. Skin: Grossly normal HEENT: Without gross lesions.  No cervical or supraclavicular adenopathy. Thyroid normal.  Lungs:  Clear without wheezing, rales or rhonchi Cardiac: RR, without RMG Abdominal:  Soft, nontender, without masses, guarding, rebound, organomegaly or hernia Breasts:  Examined lying and sitting without masses, retractions, discharge or axillary adenopathy. Pelvic:  Ext/BUS/vagina with generalized atrophic changes.  Adnexa  Without masses or tenderness    Anus and perineum  Normal   Rectovaginal  Normal sphincter tone without palpated masses or tenderness.    Assessment/Plan:  69 y.o. 692P2001 female for breast and pelvic exam.   1. Postmenopausal/atrophic genital changes/HRT. Patient continues on Estrace 1 mg daily.  Discussed the risks multiple times before and trial of weaning. Patient wants to continue. I again reviewed the WHI study with increased risk of stroke heart attack DVT and possible breast cancer. Using HRT into the 70s with possible increasing risk. Options to try to wean reviewed. At this point the patient is not interested as she does not like the way she feels when she stops the ERT. Refill 1 year provided. 2. Mammography 06/2013. Continue with annual mammography. SBE monthly reviewed. 3. DEXA 2013 normal. Repeat at 5 year interval. Increased  calcium and vitamin D.  4. Pap smear 2009. No Pap smear done today. No history of abnormal Pap smears. Status post hysterectomy for benign indications. Reviewed current screening guidelines and she is comfortable stop screening given her age and hysterectomy. 5. Colonoscopy yesterday. Did find some polyps. Will follow up at their recommended interval. 6. Health maintenance. No routine blood work done as patient has this done at her primary physician's office. Follow up 1 year, sooner as needed.     Dara LordsFONTAINE,Nobuo Nunziata P MD, 3:50 PM 06/18/2014

## 2014-06-18 NOTE — Patient Instructions (Signed)
You may obtain a copy of any labs that were done today by logging onto MyChart as outlined in the instructions provided with your AVS (after visit summary). The office will not call with normal lab results but certainly if there are any significant abnormalities then we will contact you.   Health Maintenance, Female A healthy lifestyle and preventative care can promote health and wellness.  Maintain regular health, dental, and eye exams.  Eat a healthy diet. Foods like vegetables, fruits, whole grains, low-fat dairy products, and lean protein foods contain the nutrients you need without too many calories. Decrease your intake of foods high in solid fats, added sugars, and salt. Get information about a proper diet from your caregiver, if necessary.  Regular physical exercise is one of the most important things you can do for your health. Most adults should get at least 150 minutes of moderate-intensity exercise (any activity that increases your heart rate and causes you to sweat) each week. In addition, most adults need muscle-strengthening exercises on 2 or more days a week.   Maintain a healthy weight. The body mass index (BMI) is a screening tool to identify possible weight problems. It provides an estimate of body fat based on height and weight. Your caregiver can help determine your BMI, and can help you achieve or maintain a healthy weight. For adults 20 years and older:  A BMI below 18.5 is considered underweight.  A BMI of 18.5 to 24.9 is normal.  A BMI of 25 to 29.9 is considered overweight.  A BMI of 30 and above is considered obese.  Maintain normal blood lipids and cholesterol by exercising and minimizing your intake of saturated fat. Eat a balanced diet with plenty of fruits and vegetables. Blood tests for lipids and cholesterol should begin at age 61 and be repeated every 5 years. If your lipid or cholesterol levels are high, you are over 50, or you are a high risk for heart  disease, you may need your cholesterol levels checked more frequently.Ongoing high lipid and cholesterol levels should be treated with medicines if diet and exercise are not effective.  If you smoke, find out from your caregiver how to quit. If you do not use tobacco, do not start.  Lung cancer screening is recommended for adults aged 33 80 years who are at high risk for developing lung cancer because of a history of smoking. Yearly low-dose computed tomography (CT) is recommended for people who have at least a 30-pack-year history of smoking and are a current smoker or have quit within the past 15 years. A pack year of smoking is smoking an average of 1 pack of cigarettes a day for 1 year (for example: 1 pack a day for 30 years or 2 packs a day for 15 years). Yearly screening should continue until the smoker has stopped smoking for at least 15 years. Yearly screening should also be stopped for people who develop a health problem that would prevent them from having lung cancer treatment.  If you are pregnant, do not drink alcohol. If you are breastfeeding, be very cautious about drinking alcohol. If you are not pregnant and choose to drink alcohol, do not exceed 1 drink per day. One drink is considered to be 12 ounces (355 mL) of beer, 5 ounces (148 mL) of wine, or 1.5 ounces (44 mL) of liquor.  Avoid use of street drugs. Do not share needles with anyone. Ask for help if you need support or instructions about stopping  the use of drugs.  High blood pressure causes heart disease and increases the risk of stroke. Blood pressure should be checked at least every 1 to 2 years. Ongoing high blood pressure should be treated with medicines, if weight loss and exercise are not effective.  If you are 59 to 70 years old, ask your caregiver if you should take aspirin to prevent strokes.  Diabetes screening involves taking a blood sample to check your fasting blood sugar level. This should be done once every 3  years, after age 91, if you are within normal weight and without risk factors for diabetes. Testing should be considered at a younger age or be carried out more frequently if you are overweight and have at least 1 risk factor for diabetes.  Breast cancer screening is essential preventative care for women. You should practice "breast self-awareness." This means understanding the normal appearance and feel of your breasts and may include breast self-examination. Any changes detected, no matter how small, should be reported to a caregiver. Women in their 66s and 30s should have a clinical breast exam (CBE) by a caregiver as part of a regular health exam every 1 to 3 years. After age 101, women should have a CBE every year. Starting at age 100, women should consider having a mammogram (breast X-ray) every year. Women who have a family history of breast cancer should talk to their caregiver about genetic screening. Women at a high risk of breast cancer should talk to their caregiver about having an MRI and a mammogram every year.  Breast cancer gene (BRCA)-related cancer risk assessment is recommended for women who have family members with BRCA-related cancers. BRCA-related cancers include breast, ovarian, tubal, and peritoneal cancers. Having family members with these cancers may be associated with an increased risk for harmful changes (mutations) in the breast cancer genes BRCA1 and BRCA2. Results of the assessment will determine the need for genetic counseling and BRCA1 and BRCA2 testing.  The Pap test is a screening test for cervical cancer. Women should have a Pap test starting at age 57. Between ages 25 and 35, Pap tests should be repeated every 2 years. Beginning at age 37, you should have a Pap test every 3 years as long as the past 3 Pap tests have been normal. If you had a hysterectomy for a problem that was not cancer or a condition that could lead to cancer, then you no longer need Pap tests. If you are  between ages 50 and 76, and you have had normal Pap tests going back 10 years, you no longer need Pap tests. If you have had past treatment for cervical cancer or a condition that could lead to cancer, you need Pap tests and screening for cancer for at least 20 years after your treatment. If Pap tests have been discontinued, risk factors (such as a new sexual partner) need to be reassessed to determine if screening should be resumed. Some women have medical problems that increase the chance of getting cervical cancer. In these cases, your caregiver may recommend more frequent screening and Pap tests.  The human papillomavirus (HPV) test is an additional test that may be used for cervical cancer screening. The HPV test looks for the virus that can cause the cell changes on the cervix. The cells collected during the Pap test can be tested for HPV. The HPV test could be used to screen women aged 44 years and older, and should be used in women of any age  who have unclear Pap test results. After the age of 55, women should have HPV testing at the same frequency as a Pap test.  Colorectal cancer can be detected and often prevented. Most routine colorectal cancer screening begins at the age of 44 and continues through age 20. However, your caregiver may recommend screening at an earlier age if you have risk factors for colon cancer. On a yearly basis, your caregiver may provide home test kits to check for hidden blood in the stool. Use of a small camera at the end of a tube, to directly examine the colon (sigmoidoscopy or colonoscopy), can detect the earliest forms of colorectal cancer. Talk to your caregiver about this at age 86, when routine screening begins. Direct examination of the colon should be repeated every 5 to 10 years through age 13, unless early forms of pre-cancerous polyps or small growths are found.  Hepatitis C blood testing is recommended for all people born from [birth year] through 1965 and any  individual with known risks for hepatitis C.  Practice safe sex. Use condoms and avoid high-risk sexual practices to reduce the spread of sexually transmitted infections (STIs). Sexually active women aged 36 and younger should be checked for Chlamydia, which is a common sexually transmitted infection. Older women with new or multiple partners should also be tested for Chlamydia. Testing for other STIs is recommended if you are sexually active and at increased risk.  Osteoporosis is a disease in which the bones lose minerals and strength with aging. This can result in serious bone fractures. The risk of osteoporosis can be identified using a bone density scan. Women ages 20 and over and women at risk for fractures or osteoporosis should discuss screening with their caregivers. Ask your caregiver whether you should be taking a calcium supplement or vitamin D to reduce the rate of osteoporosis.  Menopause can be associated with physical symptoms and risks. Hormone replacement therapy is available to decrease symptoms and risks. You should talk to your caregiver about whether hormone replacement therapy is right for you.  Use sunscreen. Apply sunscreen liberally and repeatedly throughout the day. You should seek shade when your shadow is shorter than you. Protect yourself by wearing long sleeves, pants, a wide-brimmed hat, and sunglasses year round, whenever you are outdoors.  Notify your caregiver of new moles or changes in moles, especially if there is a change in shape or color. Also notify your caregiver if a mole is larger than the size of a pencil eraser.  Stay current with your immunizations. Document Released: 09/13/2010 Document Revised: 06/25/2012 Document Reviewed: 09/13/2010 [facility] Patient Information 2014 [employer].

## 2014-06-19 DIAGNOSIS — R262 Difficulty in walking, not elsewhere classified: Secondary | ICD-10-CM | POA: Insufficient documentation

## 2014-06-19 NOTE — Therapy (Signed)
[facility] [address] [place], [place], [address] Phone: [phone]   Fax:  [phone]  Physical Therapy Treatment  Patient Details  Name: Linda Lucas MRN: [id number] Date of Birth: [birth year] Referring Provider:  Kristie Cowman, MD  Encounter Date: 06/18/2014    Past Medical History  Diagnosis Date  . Migraine   . Fibromyalgia   . Metatarsal fracture     spiral fx left 5th metatarsal with non union requiring casting x 6 mos and electromagnetic therapy. 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 Diabetes mellitus 2004  . Ankle fracture     bimeolar fracture  . Osteoarthritis   . IBS (irritable bowel syndrome)   . Depression   . Chronic urethritis     frequent uti's and or urethritis  . Degenerative disc disease, lumbar 2007    bulging disc  . Hypercholesteremia   . Prolactin increased 05/2002    NORMAL MRI  . Overactive bladder   . Glaucoma     Past Surgical History  Procedure Laterality Date  . Tonsillectomy and adenoidectomy    . Appendectomy  age 53  . Hemorroidectomy  age 28  . Breast biopsy      x 2  . Tubal ligation    . Hysteroscopy      D&C,HYSTEROSCOPY FOR PED FIBROID   . Abdominal hysterectomy    . Cholecystectomy open      WITH CDE  . Fracture metarsal    . Burch procedure    . Right ankle fracture    . Carpectomy hand      LEFT FOR KEINBACH'S DZ  . Fusion of discs      C 3-4 4-5 5-6 VERTBRAES IN NECK DUE TO DEGEN. DISC DZ  . Cystoscopy      X 4  . Fusion of left wrist with plate and screws  2009    CADAVER BONE GRAFT AND APPLICATION OF HUMAN GROWTH FACTORS  . Orif radial head / neck fracture  2010    RIGHT RADIAL HED EXTENDING INTO JOINT SPACE WITH PLATE AND SCREWS  . Interstim implant placement    . Cataract extraction      There were no vitals filed for this visit.  Visit Diagnosis:  Difficulty walking      Subjective  Assessment - 06/18/14 1104    Subjective No falls, I am feeling stronger.   Patient Stated Goals to go up and down the stairs, get up from sitting without difficulty   Currently in Pain? No/denies                                 PT Short Term Goals - 05/05/14 1418    PT SHORT TERM GOAL #1   Title indepednent with HEP   Time 2   Period Weeks   Status New           PT Long Term Goals - 06/11/14 1513    PT LONG TERM GOAL #4   Title increase strength of the LE's to 4/5   Status On-going               Problem List Patient Active Problem List   Diagnosis Date Noted  . Difficulty walking 06/19/2014  . Overactive bladder   . Migraine   . Fibromyalgia   . Chronic fatigue syndrome   .  Metatarsal fracture   . Diabetes mellitus   . Ankle fracture   . Osteoarthritis   . IBS (irritable bowel syndrome)   . Depression   . Chronic urethritis   . Degenerative disc disease, lumbar   . Hypercholesteremia   . Prolactin increased     Jearld LeschALBRIGHT,Illeana Edick W, PT 06/19/2014, 2:04 PM  [facility] [address] [place], [place], [address] Phone: [phone]   Fax:  [phone]

## 2014-06-20 DIAGNOSIS — R2689 Other abnormalities of gait and mobility: Secondary | ICD-10-CM | POA: Diagnosis present

## 2014-06-20 DIAGNOSIS — R262 Difficulty in walking, not elsewhere classified: Secondary | ICD-10-CM | POA: Diagnosis not present

## 2014-06-20 DIAGNOSIS — R5381 Other malaise: Secondary | ICD-10-CM | POA: Diagnosis not present

## 2014-06-30 ENCOUNTER — Ambulatory Visit (HOSPITAL_COMMUNITY): Payer: Medicare Other

## 2014-07-07 ENCOUNTER — Encounter: Payer: Self-pay | Admitting: Physical Therapy

## 2014-07-07 ENCOUNTER — Ambulatory Visit (HOSPITAL_COMMUNITY)
Admission: RE | Admit: 2014-07-07 | Discharge: 2014-07-07 | Disposition: A | Discharge status: Home / Self Care | Payer: Medicare Other | Source: Ambulatory Visit | Attending: Gynecology | Admitting: Gynecology

## 2014-07-07 ENCOUNTER — Ambulatory Visit: Payer: Medicare Other | Admitting: Physical Therapy

## 2014-07-07 DIAGNOSIS — Z1231 Encounter for screening mammogram for malignant neoplasm of breast: Secondary | ICD-10-CM | POA: Diagnosis present

## 2014-07-07 DIAGNOSIS — R5381 Other malaise: Secondary | ICD-10-CM

## 2014-07-07 DIAGNOSIS — R2689 Other abnormalities of gait and mobility: Secondary | ICD-10-CM | POA: Diagnosis not present

## 2014-07-07 DIAGNOSIS — R262 Difficulty in walking, not elsewhere classified: Secondary | ICD-10-CM

## 2014-07-07 NOTE — Therapy (Signed)
[facility] [address] [place], [place], [address] Phone: [phone]   Fax:  [phone]  Physical Therapy Treatment  Patient Details  Name: Linda KehrJudith A Lucas MRN: [id number] Date of Birth: 1945/12/04 Referring Provider:  Kristie CowmanSchooler, Karen, MD  Encounter Date: 07/07/2014      PT End of Session - 07/07/14 1646    Visit Number 5   Date for PT Re-Evaluation 08/04/14   PT Start Time 1606   PT Stop Time 1701   PT Time Calculation (min) 55 min      Past Medical History  Diagnosis Date  . Migraine   . Fibromyalgia   . Metatarsal fracture     spiral fx left 5th metatarsal with non union requiring casting x 6 mos and electromagnetic therapy. 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 Diabetes mellitus 2004  . Ankle fracture     bimeolar fracture  . Osteoarthritis   . IBS (irritable bowel syndrome)   . Depression   . Chronic urethritis     frequent uti's and or urethritis  . Degenerative disc disease, lumbar 2007    bulging disc  . Hypercholesteremia   . Prolactin increased 05/2002    NORMAL MRI  . Overactive bladder   . Glaucoma     Past Surgical History  Procedure Laterality Date  . Tonsillectomy and adenoidectomy    . Appendectomy  age 69  . Hemorroidectomy  age 69  . Breast biopsy      x 2  . Tubal ligation    . Hysteroscopy      D&C,HYSTEROSCOPY FOR PED FIBROID   . Abdominal hysterectomy    . Cholecystectomy open      WITH CDE  . Fracture metarsal    . Burch procedure    . Right ankle fracture    . Carpectomy hand      LEFT FOR KEINBACH'S DZ  . Fusion of discs      C 3-4 4-5 5-6 VERTBRAES IN NECK DUE TO DEGEN. DISC DZ  . Cystoscopy      X 4  . Fusion of left wrist with plate and screws  2009    CADAVER BONE GRAFT AND APPLICATION OF HUMAN GROWTH FACTORS  . Orif radial head / neck fracture  2010    RIGHT RADIAL HED EXTENDING INTO JOINT SPACE WITH PLATE AND  SCREWS  . Interstim implant placement    . Cataract extraction      There were no vitals filed for this visit.  Visit Diagnosis:  Difficulty walking - Plan: PT plan of care cert/re-cert  Debility - Plan: PT plan of care cert/re-cert      Subjective Assessment - 07/07/14 1610    Subjective No falls, but I am hurting in the neck area and shoulders.  Reports that she was out over the past few weeks due to eye surgery , colonoscopy and an endoscopy.   Currently in Pain? Yes   Pain Score 4    Pain Location Shoulder   Pain Orientation Right;Left   Aggravating Factors  activity   Pain Relieving Factors rest                         [facility] Adult PT Treatment/Exercise - 07/07/14 0001    High Level Balance   High Level Balance Activities Side stepping;Backward walking;Tandem walking   High Level Balance Comments also  used the Airex for standing balance, she had difficulty with this   Lumbar Exercises: Aerobic   Stationary Bike Nu step L5 6 minutes   Lumbar Exercises: Machines for Strengthening   Cybex Knee Extension 5# 3x10   Cybex Knee Flexion 20# 3x10   Other Lumbar Machine Exercise 3# marches, hip abduction   Knee/Hip Exercises: Standing   Heel Raises 2 sets;10 reps   Forward Step Up 20 reps;Right   Forward Step Up Limitations 4" step   Electrical Stimulation   Electrical Stimulation Location right shoulder   Electrical Stimulation Parameters IFC   Electrical Stimulation Goals Pain                  PT Short Term Goals - 05/05/14 1418    PT SHORT TERM GOAL #1   Title indepednent with HEP   Time 2   Period Weeks   Status New           PT Long Term Goals - 06/11/14 1513    PT LONG TERM GOAL #4   Title increase strength of the LE's to 4/5   Status On-going               Plan - 07/07/14 1647    Clinical Impression Statement No falls, "just weak"  Reports some pain int he shoulders, better mobility but reports that she fatigues  easily, 200 feet max distance without rest   PT Frequency 2x / week   PT Duration 4 weeks   PT Next Visit Plan try to get back to seeing her after her surgeries and look to progress to an HEP   Consulted and Agree with Plan of Care Patient        Problem List Patient Active Problem List   Diagnosis Date Noted  . Difficulty walking 06/19/2014  . Overactive bladder   . Migraine   . Fibromyalgia   . Chronic fatigue syndrome   . Metatarsal fracture   . Diabetes mellitus   . Ankle fracture   . Osteoarthritis   . IBS (irritable bowel syndrome)   . Depression   . Chronic urethritis   . Degenerative disc disease, lumbar   . Hypercholesteremia   . Prolactin increased     Jearld Lesch, PT 07/07/2014, 4:50 PM  [facility] [address] [place], [place], [address] Phone: [phone]   Fax:  [phone]

## 2014-07-14 ENCOUNTER — Ambulatory Visit: Payer: Medicare Other | Admitting: Physical Therapy

## 2014-07-15 ENCOUNTER — Encounter: Payer: Self-pay | Admitting: Physical Therapy

## 2014-07-15 ENCOUNTER — Ambulatory Visit: Payer: Medicare Other | Attending: Internal Medicine | Admitting: Physical Therapy

## 2014-07-15 DIAGNOSIS — R2689 Other abnormalities of gait and mobility: Secondary | ICD-10-CM | POA: Diagnosis present

## 2014-07-15 DIAGNOSIS — R5381 Other malaise: Secondary | ICD-10-CM | POA: Diagnosis not present

## 2014-07-15 DIAGNOSIS — R262 Difficulty in walking, not elsewhere classified: Secondary | ICD-10-CM | POA: Diagnosis not present

## 2014-07-15 NOTE — Therapy (Signed)
[facility] [address] [place], [place], [address] Phone: [phone]   Fax:  [phone]  Physical Therapy Treatment  Patient Details  Name: Linda KehrJudith A Bong MRN: [id number] Date of Birth: 1945-07-25 Referring Provider:  Kristie CowmanSchooler, Karen, MD  Encounter Date: 07/15/2014      PT End of Session - 07/15/14 1525    Visit Number 6   Date for PT Re-Evaluation 08/04/14   PT Start Time 1438   PT Stop Time 1528   PT Time Calculation (min) 50 min      Past Medical History  Diagnosis Date  . Migraine   . Fibromyalgia   . Metatarsal fracture     spiral fx left 5th metatarsal with non union requiring casting x 6 mos and electromagnetic therapy. 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 Diabetes mellitus 2004  . Ankle fracture     bimeolar fracture  . Osteoarthritis   . IBS (irritable bowel syndrome)   . Depression   . Chronic urethritis     frequent uti's and or urethritis  . Degenerative disc disease, lumbar 2007    bulging disc  . Hypercholesteremia   . Prolactin increased 05/2002    NORMAL MRI  . Overactive bladder   . Glaucoma     Past Surgical History  Procedure Laterality Date  . Tonsillectomy and adenoidectomy    . Appendectomy  age 69  . Hemorroidectomy  age 69  . Breast biopsy      x 2  . Tubal ligation    . Hysteroscopy      D&C,HYSTEROSCOPY FOR PED FIBROID   . Abdominal hysterectomy    . Cholecystectomy open      WITH CDE  . Fracture metarsal    . Burch procedure    . Right ankle fracture    . Carpectomy hand      LEFT FOR KEINBACH'S DZ  . Fusion of discs      C 3-4 4-5 5-6 VERTBRAES IN NECK DUE TO DEGEN. DISC DZ  . Cystoscopy      X 4  . Fusion of left wrist with plate and screws  2009    CADAVER BONE GRAFT AND APPLICATION OF HUMAN GROWTH FACTORS  . Orif radial head / neck fracture  2010    RIGHT RADIAL HED EXTENDING INTO JOINT SPACE WITH PLATE AND  SCREWS  . Interstim implant placement    . Cataract extraction      There were no vitals filed for this visit.  Visit Diagnosis:  Difficulty walking  Debility      Subjective Assessment - 07/15/14 1440    Subjective No falls, soreness is better   Currently in Pain? No/denies                         [facility]/Exercise - 07/15/14 0001    High Level Balance   High Level Balance Activities Side stepping;Backward walking;Tandem walking;Head turns;Sudden stops;Direction changes;Marching forwards;Marching backwards   High Level Balance Comments toe walk, heel walk, ball kicks, tosses, some airex work   Lumbar Exercises: Aerobic   Stationary Bike Nu step L5 6 minutes   Lumbar Exercises: Machines for Strengthening   Cybex Knee Extension 5# 3x10   Cybex Knee Flexion 25# 3x10   Other Lumbar Machine Exercise ball b/n knees squeeze  PT Short Term Goals - 05/05/14 1418    PT SHORT TERM GOAL #1   Title indepednent with HEP   Time 2   Period Weeks   Status New           PT Long Term Goals - 06/11/14 1513    PT LONG TERM GOAL #4   Title increase strength of the LE's to 4/5   Status On-going               Plan - 07/15/14 1526    Clinical Impression Statement Had difficulty with any tandem walk or stance, very difficult for her to stand on dynamic surface.   PT Next Visit Plan Work on balance   Consulted and Agree with Plan of Care Patient        Problem List Patient Active Problem List   Diagnosis Date Noted  . Difficulty walking 06/19/2014  . Overactive bladder   . Migraine   . Fibromyalgia   . Chronic fatigue syndrome   . Metatarsal fracture   . Diabetes mellitus   . Ankle fracture   . Osteoarthritis   . IBS (irritable bowel syndrome)   . Depression   . Chronic urethritis   . Degenerative disc disease, lumbar   . Hypercholesteremia   . Prolactin increased     Jearld Lesch,  PT 07/15/2014, 3:27 PM  [facility] [address] [place], [place], [address] Phone: [phone]   Fax:  [phone]

## 2014-07-22 ENCOUNTER — Encounter: Payer: Self-pay | Admitting: Gynecology

## 2014-07-23 ENCOUNTER — Encounter: Payer: Self-pay | Admitting: Physical Therapy

## 2014-07-23 ENCOUNTER — Ambulatory Visit: Payer: Medicare Other | Admitting: Physical Therapy

## 2014-07-23 DIAGNOSIS — R5381 Other malaise: Secondary | ICD-10-CM

## 2014-07-23 DIAGNOSIS — R2689 Other abnormalities of gait and mobility: Secondary | ICD-10-CM | POA: Diagnosis not present

## 2014-07-23 DIAGNOSIS — R262 Difficulty in walking, not elsewhere classified: Secondary | ICD-10-CM

## 2014-07-23 NOTE — Therapy (Signed)
[facility] [address] [place], [place], [address] Phone: [phone]   Fax:  [phone]  Physical Therapy Treatment  Patient Details  Name: Linda Lucas MRN: [id number] Date of Birth: 26-May-1945 Referring Provider:  Kristie CowmanSchooler, Karen, MD  Encounter Date: 07/23/2014      PT End of Session - 07/23/14 1438    Visit Number 7   Date for PT Re-Evaluation 08/04/14   PT Start Time 1402   PT Stop Time 1442   PT Time Calculation (min) 40 min      Past Medical History  Diagnosis Date  . Migraine   . Fibromyalgia   . Metatarsal fracture     spiral fx left 5th metatarsal with non union requiring casting x 6 mos and electromagnetic therapy. 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 Diabetes mellitus 2004  . Ankle fracture     bimeolar fracture  . Osteoarthritis   . IBS (irritable bowel syndrome)   . Depression   . Chronic urethritis     frequent uti's and or urethritis  . Degenerative disc disease, lumbar 2007    bulging disc  . Hypercholesteremia   . Prolactin increased 05/2002    NORMAL MRI  . Overactive bladder   . Glaucoma     Past Surgical History  Procedure Laterality Date  . Tonsillectomy and adenoidectomy    . Appendectomy  age 69  . Hemorroidectomy  age 69  . Breast biopsy      x 2  . Tubal ligation    . Hysteroscopy      D&C,HYSTEROSCOPY FOR PED FIBROID   . Abdominal hysterectomy    . Cholecystectomy open      WITH CDE  . Fracture metarsal    . Burch procedure    . Right ankle fracture    . Carpectomy hand      LEFT FOR KEINBACH'S DZ  . Fusion of discs      C 3-4 4-5 5-6 VERTBRAES IN NECK DUE TO DEGEN. DISC DZ  . Cystoscopy      X 4  . Fusion of left wrist with plate and screws  2009    CADAVER BONE GRAFT AND APPLICATION OF HUMAN GROWTH FACTORS  . Orif radial head / neck fracture  2010    RIGHT RADIAL HED EXTENDING INTO JOINT SPACE WITH PLATE AND  SCREWS  . Interstim implant placement    . Cataract extraction      There were no vitals filed for this visit.  Visit Diagnosis:  Difficulty walking  Debility      Subjective Assessment - 07/23/14 1415    Subjective No falls, still a little stiff and sore   Currently in Pain? No/denies                         [facility] Adult PT Treatment/Exercise - 07/23/14 0001    High Level Balance   High Level Balance Activities Side stepping;Backward walking;Tandem walking;Head turns;Sudden stops;Direction changes;Marching forwards;Marching backwards   High Level Balance Comments toe walk, heel walk, ball kicks, tosses, some airex work   Lumbar Exercises: Aerobic   Stationary Bike Nu step L5 6 minutes   Lumbar Exercises: Machines for Strengthening   Cybex Knee Extension 5# 3x10   Cybex Knee Flexion 25# 3x10   Other Lumbar Machine Exercise 3# marches, hip abduction   Other Lumbar Machine Exercise ball  b/n knees squeeze   Knee/Hip Exercises: Standing   Hip ADduction 2 sets;10 reps   Hip ADduction Limitations 3#                  PT Short Term Goals - 05/05/14 1418    PT SHORT TERM GOAL #1   Title indepednent with HEP   Time 2   Period Weeks   Status New           PT Long Term Goals - 06/11/14 1513    PT LONG TERM GOAL #4   Title increase strength of the LE's to 4/5   Status On-going               Plan - 07/23/14 1438    Clinical Impression Statement Tandem gait and dynamic surfaces really cause her balance issues   PT Next Visit Plan Work on balance, may go outside and walk on uneven surfaces   Consulted and Agree with Plan of Care Patient        Problem List Patient Active Problem List   Diagnosis Date Noted  . Difficulty walking 06/19/2014  . Overactive bladder   . Migraine   . Fibromyalgia   . Chronic fatigue syndrome   . Metatarsal fracture   . Diabetes mellitus   . Ankle fracture   . Osteoarthritis   . IBS (irritable bowel  syndrome)   . Depression   . Chronic urethritis   . Degenerative disc disease, lumbar   . Hypercholesteremia   . Prolactin increased     Jearld LeschALBRIGHT,Meral Geissinger W, PT 07/23/2014, 2:40 PM  [facility] [address] [place], [place], [address] Phone: [phone]   Fax:  [phone]

## 2014-07-28 ENCOUNTER — Ambulatory Visit: Payer: Medicare Other | Admitting: Physical Therapy

## 2014-07-28 ENCOUNTER — Encounter: Payer: Self-pay | Admitting: Physical Therapy

## 2014-07-28 DIAGNOSIS — R2689 Other abnormalities of gait and mobility: Secondary | ICD-10-CM | POA: Diagnosis not present

## 2014-07-28 DIAGNOSIS — R262 Difficulty in walking, not elsewhere classified: Secondary | ICD-10-CM

## 2014-07-28 DIAGNOSIS — R5381 Other malaise: Secondary | ICD-10-CM

## 2014-07-28 NOTE — Therapy (Addendum)
[address] [place], [place], [address] Phone: [phone]   Fax:  [phone]  Physical Therapy Treatment  Patient Details  Name: Linda Lucas MRN: [id number] Date of Birth: [birth year] Referring Provider:  Dorian Heckle, MD  Encounter Date: 07/28/2014    Past Medical History  Diagnosis Date  . Migraine   . Fibromyalgia   . Metatarsal fracture     spiral fx left 5th metatarsal with non union requiring casting x 6 mos and electromagnetic therapy. 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 Diabetes mellitus 2004  . Ankle fracture     bimeolar fracture  . Osteoarthritis   . IBS (irritable bowel syndrome)   . Depression   . Chronic urethritis     frequent uti's and or urethritis  . Degenerative disc disease, lumbar 2007    bulging disc  . Hypercholesteremia   . Prolactin increased 05/2002    NORMAL MRI  . Overactive bladder   . Glaucoma     Past Surgical History  Procedure Laterality Date  . Tonsillectomy and adenoidectomy    . Appendectomy  age 34  . Hemorroidectomy  age 72  . Breast biopsy      x 2  . Tubal ligation    . Hysteroscopy      D&C,HYSTEROSCOPY FOR PED FIBROID   . Abdominal hysterectomy    . Cholecystectomy open      WITH CDE  . Fracture metarsal    . Burch procedure    . Right ankle fracture    . Carpectomy hand      LEFT FOR KEINBACH'S DZ  . Fusion of discs      C 3-4 4-5 5-6 VERTBRAES IN NECK DUE TO DEGEN. [place] DZ  . Cystoscopy      X 4  . Fusion of left wrist with plate and screws  1638    CADAVER BONE GRAFT AND APPLICATION OF HUMAN GROWTH FACTORS  . Orif radial head / neck fracture  2010    RIGHT RADIAL HED EXTENDING INTO JOINT SPACE WITH PLATE AND SCREWS  . Interstim implant placement    . Cataract extraction      There were no vitals filed for this visit.  Visit Diagnosis:  Difficulty  walking  Debility                                 PT Short Term Goals - 05/05/14 1418    PT SHORT TERM GOAL #1   Title indepednent with HEP   Time 2   Period Weeks   Status New           PT Long Term Goals - 06/11/14 1513    PT LONG TERM GOAL #4   Title increase strength of the LE's to 4/5   Status On-going               Problem List Patient Active Problem List   Diagnosis Date Noted  . Difficulty walking 06/19/2014  . Overactive bladder   . Migraine   . Fibromyalgia   . Chronic fatigue syndrome   . Metatarsal fracture   . Diabetes mellitus   . Ankle fracture   . Osteoarthritis   . IBS (irritable bowel syndrome)   . Depression   . Chronic urethritis   . Degenerative disc disease, lumbar   .  Hypercholesteremia   . Prolactin increased   PHYSICAL THERAPY DISCHARGE SUMMARY  Visits from Start of Care: 8   Plan: Patient agrees to discharge.  Patient goals were partially met. Patient is being discharged due to not returning since the last visit.  ?????       Sumner Boast., PT 10/22/2014, 8:23 AM  [address] [place], [place], [address] Phone: [phone]   Fax:  [phone]

## 2014-10-17 ENCOUNTER — Telehealth: Payer: Self-pay | Admitting: *Deleted

## 2014-10-17 MED ORDER — FLUCONAZOLE 200 MG PO TABS
ORAL_TABLET | ORAL | Status: DC
Start: 2014-10-17 — End: 2014-11-08

## 2014-10-17 NOTE — Telephone Encounter (Signed)
Left on voicemail Rx sent 

## 2014-10-17 NOTE — Telephone Encounter (Signed)
Pt called c/o internal itching and white discharge, requesting Diflucan Rx. Please advise

## 2014-10-17 NOTE — Telephone Encounter (Signed)
Okay for Diflucan 200 mg #5 one by mouth when necessary itching no refill

## 2014-11-08 ENCOUNTER — Other Ambulatory Visit: Payer: Self-pay | Admitting: Gynecology

## 2014-11-26 DIAGNOSIS — Z9889 Other specified postprocedural states: Secondary | ICD-10-CM | POA: Insufficient documentation

## 2014-11-26 DIAGNOSIS — M67919 Unspecified disorder of synovium and tendon, unspecified shoulder: Secondary | ICD-10-CM | POA: Insufficient documentation

## 2014-12-22 ENCOUNTER — Other Ambulatory Visit: Payer: Self-pay | Admitting: Ophthalmology

## 2014-12-22 DIAGNOSIS — H5340 Unspecified visual field defects: Secondary | ICD-10-CM

## 2015-01-06 ENCOUNTER — Ambulatory Visit (HOSPITAL_COMMUNITY)
Admission: RE | Admit: 2015-01-06 | Discharge: 2015-01-06 | Disposition: A | Discharge status: Home / Self Care | Payer: Medicare Other | Source: Ambulatory Visit | Attending: Ophthalmology | Admitting: Ophthalmology

## 2015-01-06 DIAGNOSIS — H409 Unspecified glaucoma: Secondary | ICD-10-CM | POA: Insufficient documentation

## 2015-01-06 DIAGNOSIS — H538 Other visual disturbances: Secondary | ICD-10-CM | POA: Insufficient documentation

## 2015-01-06 DIAGNOSIS — I6782 Cerebral ischemia: Secondary | ICD-10-CM | POA: Diagnosis not present

## 2015-01-06 DIAGNOSIS — H4720 Unspecified optic atrophy: Secondary | ICD-10-CM | POA: Diagnosis not present

## 2015-01-06 DIAGNOSIS — H9190 Unspecified hearing loss, unspecified ear: Secondary | ICD-10-CM | POA: Insufficient documentation

## 2015-01-06 DIAGNOSIS — H5340 Unspecified visual field defects: Secondary | ICD-10-CM

## 2015-01-06 DIAGNOSIS — G319 Degenerative disease of nervous system, unspecified: Secondary | ICD-10-CM | POA: Diagnosis not present

## 2015-01-06 LAB — CREATININE, SERUM
CREATININE: 0.98 mg/dL (ref 0.44–1.00)
GFR calc Af Amer: >60 mL/min (ref >60)
GFR, EST NON AFRICAN AMERICAN: 57 mL/min — AB (ref >60)

## 2015-01-06 MED ORDER — GADOBENATE DIMEGLUMINE 529 MG/ML IV SOLN
20.0000 mL | Freq: Once | INTRAVENOUS | Status: AC | PRN
  Administered 2015-01-06: 20 mL via INTRAVENOUS

## 2015-01-08 ENCOUNTER — Other Ambulatory Visit: Payer: Medicare Other

## 2015-03-30 ENCOUNTER — Telehealth: Payer: Self-pay

## 2015-03-30 NOTE — Telephone Encounter (Signed)
I called patient back and left message on home phone machine per DPR access note. I told her that Dr. Velvet BatheF recommended appt with colorectal specialist, [facility]licia [facility] and it requires referral. I asked her to call back and let us know if she would like to proceed with that and we will handle referral/appt.

## 2015-03-30 NOTE — Telephone Encounter (Signed)
Patient called because x 5 weeks she has been "leaking stool" and having to wear adult diaper.  She wanted to see if you did this surgery.  I will let her know you do not. Is there anyone specific you would refer her to or should I just giver her [place]'s information?

## 2015-03-30 NOTE — Telephone Encounter (Signed)
[facility]

## 2015-04-03 DIAGNOSIS — I10 Essential (primary) hypertension: Secondary | ICD-10-CM | POA: Insufficient documentation

## 2015-04-03 DIAGNOSIS — G43009 Migraine without aura, not intractable, without status migrainosus: Secondary | ICD-10-CM | POA: Insufficient documentation

## 2015-04-03 DIAGNOSIS — E039 Hypothyroidism, unspecified: Secondary | ICD-10-CM | POA: Insufficient documentation

## 2015-04-03 DIAGNOSIS — M797 Fibromyalgia: Secondary | ICD-10-CM | POA: Insufficient documentation

## 2015-04-03 DIAGNOSIS — Z860100 Personal history of colon polyps, unspecified: Secondary | ICD-10-CM | POA: Insufficient documentation

## 2015-04-03 DIAGNOSIS — R159 Full incontinence of feces: Secondary | ICD-10-CM | POA: Insufficient documentation

## 2015-04-03 DIAGNOSIS — K209 Esophagitis, unspecified without bleeding: Secondary | ICD-10-CM | POA: Insufficient documentation

## 2015-04-03 DIAGNOSIS — J40 Bronchitis, not specified as acute or chronic: Secondary | ICD-10-CM | POA: Insufficient documentation

## 2015-04-03 DIAGNOSIS — H9193 Unspecified hearing loss, bilateral: Secondary | ICD-10-CM | POA: Insufficient documentation

## 2015-04-03 DIAGNOSIS — M2550 Pain in unspecified joint: Secondary | ICD-10-CM | POA: Insufficient documentation

## 2015-04-03 DIAGNOSIS — F3342 Major depressive disorder, recurrent, in full remission: Secondary | ICD-10-CM | POA: Insufficient documentation

## 2015-04-06 ENCOUNTER — Encounter (HOSPITAL_BASED_OUTPATIENT_CLINIC_OR_DEPARTMENT_OTHER): Payer: Self-pay | Admitting: Emergency Medicine

## 2015-04-06 ENCOUNTER — Emergency Department (HOSPITAL_BASED_OUTPATIENT_CLINIC_OR_DEPARTMENT_OTHER): Payer: Medicare Other

## 2015-04-06 ENCOUNTER — Emergency Department (HOSPITAL_BASED_OUTPATIENT_CLINIC_OR_DEPARTMENT_OTHER)
Admission: EM | Admit: 2015-04-06 | Discharge: 2015-04-06 | Disposition: A | Discharge status: Home / Self Care | Payer: Medicare Other | Attending: Emergency Medicine | Admitting: Emergency Medicine

## 2015-04-06 DIAGNOSIS — S80212A Abrasion, left knee, initial encounter: Secondary | ICD-10-CM | POA: Diagnosis not present

## 2015-04-06 DIAGNOSIS — Z87448 Personal history of other diseases of urinary system: Secondary | ICD-10-CM | POA: Insufficient documentation

## 2015-04-06 DIAGNOSIS — S022XXA Fracture of nasal bones, initial encounter for closed fracture: Secondary | ICD-10-CM

## 2015-04-06 DIAGNOSIS — Z8781 Personal history of (healed) traumatic fracture: Secondary | ICD-10-CM | POA: Diagnosis not present

## 2015-04-06 DIAGNOSIS — E119 Type 2 diabetes mellitus without complications: Secondary | ICD-10-CM | POA: Insufficient documentation

## 2015-04-06 DIAGNOSIS — Y998 Other external cause status: Secondary | ICD-10-CM | POA: Diagnosis not present

## 2015-04-06 DIAGNOSIS — W010XXA Fall on same level from slipping, tripping and stumbling without subsequent striking against object, initial encounter: Secondary | ICD-10-CM | POA: Insufficient documentation

## 2015-04-06 DIAGNOSIS — F329 Major depressive disorder, single episode, unspecified: Secondary | ICD-10-CM | POA: Insufficient documentation

## 2015-04-06 DIAGNOSIS — M1990 Unspecified osteoarthritis, unspecified site: Secondary | ICD-10-CM | POA: Insufficient documentation

## 2015-04-06 DIAGNOSIS — S0081XA Abrasion of other part of head, initial encounter: Secondary | ICD-10-CM | POA: Diagnosis not present

## 2015-04-06 DIAGNOSIS — Z7982 Long term (current) use of aspirin: Secondary | ICD-10-CM | POA: Insufficient documentation

## 2015-04-06 DIAGNOSIS — T07XXXA Unspecified multiple injuries, initial encounter: Secondary | ICD-10-CM

## 2015-04-06 DIAGNOSIS — Z793 Long term (current) use of hormonal contraceptives: Secondary | ICD-10-CM | POA: Diagnosis not present

## 2015-04-06 DIAGNOSIS — Y9289 Other specified places as the place of occurrence of the external cause: Secondary | ICD-10-CM | POA: Insufficient documentation

## 2015-04-06 DIAGNOSIS — Y9389 Activity, other specified: Secondary | ICD-10-CM | POA: Insufficient documentation

## 2015-04-06 DIAGNOSIS — E7800 Pure hypercholesterolemia, unspecified: Secondary | ICD-10-CM | POA: Diagnosis not present

## 2015-04-06 DIAGNOSIS — W19XXXA Unspecified fall, initial encounter: Secondary | ICD-10-CM

## 2015-04-06 DIAGNOSIS — S0990XA Unspecified injury of head, initial encounter: Secondary | ICD-10-CM | POA: Insufficient documentation

## 2015-04-06 DIAGNOSIS — M25561 Pain in right knee: Secondary | ICD-10-CM

## 2015-04-06 DIAGNOSIS — G43909 Migraine, unspecified, not intractable, without status migrainosus: Secondary | ICD-10-CM | POA: Diagnosis not present

## 2015-04-06 DIAGNOSIS — Z79899 Other long term (current) drug therapy: Secondary | ICD-10-CM | POA: Diagnosis not present

## 2015-04-06 MED ORDER — HYDROCODONE-ACETAMINOPHEN 5-325 MG PO TABS
1.0000 | ORAL_TABLET | Freq: Once | ORAL | Status: AC
  Administered 2015-04-06: 1 via ORAL
  Filled 2015-04-06: qty 1

## 2015-04-06 MED ORDER — ONDANSETRON HCL 4 MG PO TABS: 4.0000 mg | ORAL_TABLET | Freq: Four times a day (QID) | ORAL | Status: DC

## 2015-04-06 MED ORDER — TETANUS-DIPHTH-ACELL PERTUSSIS 5-2.5-18.5 LF-MCG/0.5 IM SUSP
0.5000 mL | Freq: Once | INTRAMUSCULAR | Status: AC
  Administered 2015-04-06: 0.5 mL via INTRAMUSCULAR
  Filled 2015-04-06: qty 0.5

## 2015-04-06 MED ORDER — HYDROCODONE-ACETAMINOPHEN 5-325 MG PO TABS: 1.0000 | ORAL_TABLET | ORAL | Status: DC | PRN

## 2015-04-06 NOTE — ED Notes (Signed)
[facility] called and verified that a CT was ok to do with the patients nerve stimulator

## 2015-04-06 NOTE — ED Provider Notes (Signed)
CSN: [id number]     Arrival date & time 04/06/15  1623 History  By signing my name below, I, [name], attest that this documentation has been prepared under the direction and in the presence of Jerelyn Scott, MD. Electronically Signed: [name], ED Scribe. 04/06/2015. 5:00 PM.    Chief Complaint  Patient presents with  . Fall      Patient is a 70 y.o. female presenting with fall and scalp laceration. The history is provided by the patient. No language interpreter was used.  Fall This is a new problem. The current episode started less than 1 hour ago. The problem occurs rarely. The problem has not changed since onset.Associated symptoms include headaches. Nothing aggravates the symptoms. The symptoms are relieved by rest. She has tried nothing for the symptoms.  Head Laceration This is a new problem. The current episode started less than 1 hour ago. Associated symptoms include headaches. Nothing aggravates the symptoms. The symptoms are relieved by rest. She has tried nothing for the symptoms.   HPI Comments: Linda Lucas is a 70 y.o. female who presents to the Emergency Department complaining of a fall that occurred immediately PTA. Pt reports that she and her husband were at a store, and she was walking back to her car in the parking lot when she tripped and landed face-first on the ground, in which she began bleeding from the upper left side of her forehead, left side of her nose, and chin. Pt states she did not pass out, and states she may have tripped over her own feet, or over something in the parking lot. Pt states she was able to walk afterwards, and denies LOC. Pt also reports she hit her left knee, and had bleeding controlled upon arrival to ED and currently reports she has a constant headache from the fall. Pt denies seizures, vomiting, neck pain, back pain. Pt notes that her glasses were broken from the fall and she lost one of her hearing aids.    Past Medical History   Diagnosis Date  . Migraine   . Fibromyalgia   . Metatarsal fracture     spiral fx left 5th metatarsal with non union requiring casting x 6 mos and electromagnetic therapy. 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 Diabetes mellitus 2004  . Ankle fracture     bimeolar fracture  . Osteoarthritis   . IBS (irritable bowel syndrome)   . Depression   . Chronic urethritis     frequent uti's and or urethritis  . Degenerative disc disease, lumbar 2007    bulging disc  . Hypercholesteremia   . Prolactin increased (HCC) 05/2002    NORMAL MRI  . Overactive bladder   . Glaucoma    Past Surgical History  Procedure Laterality Date  . Tonsillectomy and adenoidectomy    . Appendectomy  age 75  . Hemorroidectomy  age 4  . Breast biopsy      x 2  . Tubal ligation    . Hysteroscopy      D&C,HYSTEROSCOPY FOR PED FIBROID   . Abdominal hysterectomy    . Cholecystectomy open      WITH CDE  . Fracture metarsal    . Burch procedure    . Right ankle fracture    . Carpectomy hand      LEFT FOR KEINBACH'S DZ  . Fusion of discs      C 3-4 4-5 5-6 VERTBRAES IN NECK  DUE TO DEGEN. DISC DZ  . Cystoscopy      X 4  . Fusion of left wrist with plate and screws  2009    CADAVER BONE GRAFT AND APPLICATION OF HUMAN GROWTH FACTORS  . Orif radial head / neck fracture  2010    RIGHT RADIAL HED EXTENDING INTO JOINT SPACE WITH PLATE AND SCREWS  . Interstim implant placement    . Cataract extraction    . Rotator cuff repair     Family History  Problem Relation Age of Onset  . Diabetes Mother   . Hypertension Mother   . Heart disease Mother   . Diabetes Father   . Heart disease Father   . Hypertension Father   . Diabetes Sister   . Osteoarthritis Sister   . Diabetes Brother   . Cancer Maternal Grandmother     [facility] [place]  . Other Son     paralyzed after motorcycle accident  . Other Daughter     needle stick at hospital died of AIDS and hepatiitis    Social History  Substance Use Topics  . Smoking status: Never Smoker   . Smokeless tobacco: Never Used  . Alcohol Use: No   OB History    Gravida Para Term Preterm AB TAB SAB Ectopic Multiple Living   0     1     Review of Systems  Neurological: Positive for headaches.    A complete 10 system review of systems was obtained and all systems are negative except as noted in the HPI and PMH.    Allergies  Aspirin; Oxycodone-acetaminophen; Oxymorphone hcl; and Ivp dye  Home Medications   Prior to Admission medications   Medication Sig Start Date End Date Taking? Authorizing Provider  almotriptan (AXERT) 12.5 MG tablet Take 12.5 mg by mouth as needed. may repeat in 2 hours if needed     Historical Provider, MD  aspirin 81 MG tablet Take 81 mg by mouth 2 (two) times daily. Takes two tabs am and pm.     Historical Provider, MD  atorvastatin (LIPITOR) 40 MG tablet Take 40 mg by mouth daily.      Historical Provider, MD  clonazePAM (KLONOPIN) 1 MG tablet Take 1 mg by mouth 2 (two) times daily as needed for anxiety.    Historical Provider, MD  conjugated estrogens (PREMARIN) vaginal cream Apply 1 gram vaginally twice weekly Patient not taking: Reported on 06/18/2014 07/08/13   Dara Lords, MD  DiphenhydrAMINE HCl (BENADRYL PO) Take by mouth.    Historical Provider, MD  eletriptan (RELPAX) 40 MG tablet One tablet by mouth as needed for migraine headache.  If the headache improves and then returns, dose may be repeated after 2 hours have elapsed since first dose (do not exceed 80 mg per day). may repeat in 2 hours if necessary     Historical Provider, MD  estradiol (ESTRACE) 1 MG tablet TAKE ONE (1) TABLET EACH DAY 06/18/14   Dara Lords, MD  FIBER PO Take by mouth.    Historical Provider, MD  fluconazole (DIFLUCAN) 200 MG tablet TAKE 1 TABLET BY MOUTH AS NEEDED FOR ITCHING 11/10/14   Dara Lords, MD  HYDROcodone-acetaminophen (NORCO/VICODIN) 5-325 MG tablet Take 1-2  tablets by mouth every 4 (four) hours as needed. 04/06/15   Jerelyn Scott, MD  ibuprofen (ADVIL,MOTRIN) 200 MG tablet Take 200 mg by mouth every 6 (six) hours as needed.    Historical Provider, MD  levothyroxine (SYNTHROID) 100 MCG tablet Take 100 mcg by mouth daily.      Historical Provider, MD  MetFORMIN HCl (GLUCOPHAGE PO) Take by mouth.      Historical Provider, MD  Multiple Vitamin (MULTIVITAMIN) tablet Take 1 tablet by mouth daily.    Historical Provider, MD  NADOLOL PO Take 120 mg by mouth daily.      Historical Provider, MD  omeprazole (PRILOSEC) 20 MG capsule Take 20 mg by mouth daily.    Historical Provider, MD  ondansetron (ZOFRAN) 4 MG tablet Take 1 tablet (4 mg total) by mouth every 6 (six) hours. 04/06/15   Jerelyn Scott, MD  promethazine (PHENERGAN) 25 MG tablet Take 25 mg by mouth every 6 (six) hours as needed.      Historical Provider, MD  Rizatriptan Benzoate (MAXALT PO) Take by mouth.      Historical Provider, MD  SUMAtriptan (IMITREX) 100 MG tablet Take 100 mg by mouth every 2 (two) hours as needed.      Historical Provider, MD  Venlafaxine HCl (EFFEXOR PO) Take by mouth.      Historical Provider, MD  zolmitriptan (ZOMIG) 5 MG tablet Take 5 mg by mouth as needed.      Historical Provider, MD  zolpidem (AMBIEN) 10 MG tablet Take 10 mg by mouth at bedtime as needed.      Historical Provider, MD   BP 142/51 mmHg  Pulse 63  Temp(Src) 97.9 F (36.6 C) (Oral)  Resp 18  Wt 194 lb (87.998 kg)  SpO2 100%  Vitals reviewed Physical Exam  Physical Examination: General appearance - alert, well appearing, and in no distress Mental status - alert, oriented to person, place, and time Head- forehead hematoma, abrasion to forehead and left side of nose Eyes - pupils equal and reactive, extraocular eye movements intact Ears - bilateral TM's and external ear canals normal, no hemotympanum Mouth - mucous membranes moist, pharynx normal without lesions Neck - no midline tenderness to  palpation, FROM without pain Chest - clear to auscultation, no wheezes, rales or rhonchi, symmetric air entry Heart - normal rate, regular rhythm, normal S1, S2, no murmurs, rubs, clicks or gallops Abdomen - soft, nontender, nondistended, no masses or organomegaly Back exam - no midline tenderness to palpation, no CVA tenderness Neurological - alert, oriented, normal speech Musculoskeletal - ttp with abrasion overlying left knee - ttp over patella, otherwise no joint tenderness, deformity or swelling Extremities - peripheral pulses normal, no pedal edema, no clubbing or cyanosis Skin - normal coloration and turgor, no rashes, abrasions overlying forehead and face and left knee  ED Course  Procedures (including critical care time)  DIAGNOSTIC STUDIES: Oxygen Saturation is 100% on room air, normal by my interpretation.    COORDINATION OF CARE: 4:53 PM Discussed treatment plan with pt at bedside and pt agreed to plan.   Labs Review Labs Reviewed - No data to display  Imaging Review Ct Head Wo Contrast  04/06/2015  CLINICAL DATA:  Fall. Injury. Tripped and fell, landing face first. Abrasions of the nose, face, headache. EXAM: CT HEAD WITHOUT CONTRAST CT MAXILLOFACIAL WITHOUT CONTRAST TECHNIQUE: Multidetector CT imaging of the head and maxillofacial structures were performed using the standard protocol without intravenous contrast. Multiplanar CT image reconstructions of the maxillofacial structures were also generated. COMPARISON:  MRI 01/06/2015 FINDINGS: CT HEAD FINDINGS There is moderate central cortical atrophy. Periventricular white matter changes are consistent with small vessel disease. There is no intra or extra-axial fluid collection or mass lesion.  The basilar cisterns and ventricles have a normal appearance. There is no CT evidence for acute infarction or hemorrhage. Bone windows demonstrate left frontal scalp hematoma not associated with underlying calvarial fracture. There are  bilateral nasal bone fractures associated soft tissue swelling. CT MAXILLOFACIAL FINDINGS Bilateral nasal bone fractures associated with soft tissue swelling. Bony nasal septum is intact. There is mucoperiosteal thickening within the maxillary sinuses bilaterally. The orbits and globes are intact. Zygomatic arches, temporomandibular joints, and mandible are intact. IMPRESSION: 1. Left frontal scalp hematoma. 2.  No evidence for acute intracranial abnormality. 3. Bilateral nasal bone fractures. 4. Chronic sinus changes. Electronically Signed   By: Norva Pavlov M.D.   On: 04/06/2015 17:51   Dg Knee Complete 4 Views Left  04/06/2015  CLINICAL DATA:  Status post trip and fall with an injury to the left knee today. Pain. Initial encounter. EXAM: LEFT KNEE - COMPLETE 4+ VIEW COMPARISON:  None. FINDINGS: There is no evidence of fracture, dislocation, or joint effusion. There is no evidence of arthropathy or other focal bone abnormality. Soft tissues are unremarkable. IMPRESSION: Negative exam. Electronically Signed   By: Drusilla Kanner M.D.   On: 04/06/2015 17:52   Ct Maxillofacial Wo Cm  04/06/2015  CLINICAL DATA:  Fall. Injury. Tripped and fell, landing face first. Abrasions of the nose, face, headache. EXAM: CT HEAD WITHOUT CONTRAST CT MAXILLOFACIAL WITHOUT CONTRAST TECHNIQUE: Multidetector CT imaging of the head and maxillofacial structures were performed using the standard protocol without intravenous contrast. Multiplanar CT image reconstructions of the maxillofacial structures were also generated. COMPARISON:  MRI 01/06/2015 FINDINGS: CT HEAD FINDINGS There is moderate central cortical atrophy. Periventricular white matter changes are consistent with small vessel disease. There is no intra or extra-axial fluid collection or mass lesion. The basilar cisterns and ventricles have a normal appearance. There is no CT evidence for acute infarction or hemorrhage. Bone windows demonstrate left frontal scalp  hematoma not associated with underlying calvarial fracture. There are bilateral nasal bone fractures associated soft tissue swelling. CT MAXILLOFACIAL FINDINGS Bilateral nasal bone fractures associated with soft tissue swelling. Bony nasal septum is intact. There is mucoperiosteal thickening within the maxillary sinuses bilaterally. The orbits and globes are intact. Zygomatic arches, temporomandibular joints, and mandible are intact. IMPRESSION: 1. Left frontal scalp hematoma. 2.  No evidence for acute intracranial abnormality. 3. Bilateral nasal bone fractures. 4. Chronic sinus changes. Electronically Signed   By: Norva Pavlov M.D.   On: 04/06/2015 17:51   I have personally reviewed and evaluated these images and lab results as part of my medical decision-making.   EKG Interpretation None      MDM   Final diagnoses:  Fall, initial encounter  Minor head injury, initial encounter  Nasal bone fracture, closed, initial encounter  Abrasions of multiple sites  Knee pain, acute, right    Pt presenting after mechanical fall which occurred just prior to arrival.  Head CT reassuring.  Maxillofacial CT shows bilateral nasal bone fractures.  Knee xray reassuring.  Pt's tetanus was updated.  She is GCS 15.  Pt given pain medication and information for ENT followup for nasal bone fractures.  Discharged with strict return precautions.  Pt agreeable with plan.  I personally performed the services described in this documentation, which was scribed in my presence. The recorded information has been reviewed and is accurate.      Jerelyn Scott, MD 04/06/15 2016

## 2015-04-06 NOTE — ED Notes (Signed)
Patient states that she was out in the [place] and tripped and fell landing on her face. Patient also has pain to her left knee. The patient reports that she is "forgetting things" since she fell. Husband reports that she does that all the time, not just from the fall. The patient has abrasion to her left knee and left chin, face and nose.

## 2015-04-06 NOTE — ED Notes (Signed)
ICE PACK taken to patient

## 2015-04-06 NOTE — ED Notes (Signed)
Patient transported to CT 

## 2015-04-06 NOTE — Discharge Instructions (Signed)
Return to the ED with any concerns including vomiting, seizure activity, difficulty breathing, fainting, decreased level of alertness/lethargy, or any other alarming symptoms  You should arrange for followup with ENT, also avoid blowing your nose

## 2015-05-01 ENCOUNTER — Other Ambulatory Visit: Payer: Self-pay | Admitting: *Deleted

## 2015-05-21 ENCOUNTER — Other Ambulatory Visit: Payer: Self-pay

## 2015-05-21 DIAGNOSIS — Z1231 Encounter for screening mammogram for malignant neoplasm of breast: Secondary | ICD-10-CM

## 2015-06-25 ENCOUNTER — Ambulatory Visit (INDEPENDENT_AMBULATORY_CARE_PROVIDER_SITE_OTHER): Payer: Medicare Other | Admitting: Gynecology

## 2015-06-25 ENCOUNTER — Encounter: Payer: Self-pay | Admitting: Gynecology

## 2015-06-25 VITALS — BP 124/78 | Ht 63.0 in | Wt 190.0 lb

## 2015-06-25 DIAGNOSIS — Z79890 Hormone replacement therapy (postmenopausal): Secondary | ICD-10-CM | POA: Diagnosis not present

## 2015-06-25 DIAGNOSIS — Z01419 Encounter for gynecological examination (general) (routine) without abnormal findings: Secondary | ICD-10-CM | POA: Diagnosis not present

## 2015-06-25 DIAGNOSIS — N952 Postmenopausal atrophic vaginitis: Secondary | ICD-10-CM

## 2015-06-25 MED ORDER — ESTRADIOL 1 MG PO TABS: ORAL_TABLET | ORAL | Status: DC

## 2015-06-25 NOTE — Patient Instructions (Signed)
Try taking one half of the estrogen replacement pill to see how you do with that.  You may obtain a copy of any labs that were done today by logging onto MyChart as outlined in the instructions provided with your AVS (after visit summary). The office will not call with normal lab results but certainly if there are any significant abnormalities then we will contact you.   Health Maintenance Adopting a healthy lifestyle and getting preventive care can go a long way to promote health and wellness. Talk with your health care provider about what schedule of regular examinations is right for you. This is a good chance for you to check in with your provider about disease prevention and staying healthy. In between checkups, there are plenty of things you can do on your own. Experts have done a lot of research about which lifestyle changes and preventive measures are most likely to keep you healthy. Ask your health care provider for more information. WEIGHT AND DIET  Eat a healthy diet  Be sure to include plenty of vegetables, fruits, low-fat dairy products, and lean protein.  Do not eat a lot of foods high in solid fats, added sugars, or salt.  Get regular exercise. This is one of the most important things you can do for your health.  Most adults should exercise for at least 150 minutes each week. The exercise should increase your heart rate and make you sweat (moderate-intensity exercise).  Most adults should also do strengthening exercises at least twice a week. This is in addition to the moderate-intensity exercise.  Maintain a healthy weight  Body mass index (BMI) is a measurement that can be used to identify possible weight problems. It estimates body fat based on height and weight. Your health care provider can help determine your BMI and help you achieve or maintain a healthy weight.  For females 44 years of age and older:   A BMI below 18.5 is considered underweight.  A BMI of 18.5 to  24.9 is normal.  A BMI of 25 to 29.9 is considered overweight.  A BMI of 30 and above is considered obese.  Watch levels of cholesterol and blood lipids  You should start having your blood tested for lipids and cholesterol at 70 years of age, then have this test every 5 years.  You may need to have your cholesterol levels checked more often if:  Your lipid or cholesterol levels are high.  You are older than 71 years of age.  You are at high risk for heart disease.  CANCER SCREENING   Lung Cancer  Lung cancer screening is recommended for adults 80-57 years old who are at high risk for lung cancer because of a history of smoking.  A yearly low-dose CT scan of the lungs is recommended for people who:  Currently smoke.  Have quit within the past 15 years.  Have at least a 30-pack-year history of smoking. A pack year is smoking an average of one pack of cigarettes a day for 1 year.  Yearly screening should continue until it has been 15 years since you quit.  Yearly screening should stop if you develop a health problem that would prevent you from having lung cancer treatment.  Breast Cancer  Practice breast self-awareness. This means understanding how your breasts normally appear and feel.  It also means doing regular breast self-exams. Let your health care provider know about any changes, no matter how small.  If you are in your 4s  or 36s, you should have a clinical breast exam (CBE) by a health care provider every 1-3 years as part of a regular health exam.  If you are 34 or older, have a CBE every year. Also consider having a breast X-ray (mammogram) every year.  If you have a family history of breast cancer, talk to your health care provider about genetic screening.  If you are at high risk for breast cancer, talk to your health care provider about having an MRI and a mammogram every year.  Breast cancer gene (BRCA) assessment is recommended for women who have family  members with BRCA-related cancers. BRCA-related cancers include:  Breast.  Ovarian.  Tubal.  Peritoneal cancers.  Results of the assessment will determine the need for genetic counseling and BRCA1 and BRCA2 testing. Cervical Cancer Routine pelvic examinations to screen for cervical cancer are no longer recommended for nonpregnant women who are considered low risk for cancer of the pelvic organs (ovaries, uterus, and vagina) and who do not have symptoms. A pelvic examination may be necessary if you have symptoms including those associated with pelvic infections. Ask your health care provider if a screening pelvic exam is right for you.   The Pap test is the screening test for cervical cancer for women who are considered at risk.  If you had a hysterectomy for a problem that was not cancer or a condition that could lead to cancer, then you no longer need Pap tests.  If you are older than 65 years, and you have had normal Pap tests for the past 10 years, you no longer need to have Pap tests.  If you have had past treatment for cervical cancer or a condition that could lead to cancer, you need Pap tests and screening for cancer for at least 20 years after your treatment.  If you no longer get a Pap test, assess your risk factors if they change (such as having a new sexual partner). This can affect whether you should start being screened again.  Some women have medical problems that increase their chance of getting cervical cancer. If this is the case for you, your health care provider may recommend more frequent screening and Pap tests.  The human papillomavirus (HPV) test is another test that may be used for cervical cancer screening. The HPV test looks for the virus that can cause cell changes in the cervix. The cells collected during the Pap test can be tested for HPV.  The HPV test can be used to screen women 67 years of age and older. Getting tested for HPV can extend the interval  between normal Pap tests from three to five years.  An HPV test also should be used to screen women of any age who have unclear Pap test results.  After 71 years of age, women should have HPV testing as often as Pap tests.  Colorectal Cancer  This type of cancer can be detected and often prevented.  Routine colorectal cancer screening usually begins at 71 years of age and continues through 71 years of age.  Your health care provider may recommend screening at an earlier age if you have risk factors for colon cancer.  Your health care provider may also recommend using home test kits to check for hidden blood in the stool.  A small camera at the end of a tube can be used to examine your colon directly (sigmoidoscopy or colonoscopy). This is done to check for the earliest forms of colorectal cancer.  Routine screening usually begins at age 63.  Direct examination of the colon should be repeated every 5-10 years through 71 years of age. However, you may need to be screened more often if early forms of precancerous polyps or small growths are found. Skin Cancer  Check your skin from head to toe regularly.  Tell your health care provider about any new moles or changes in moles, especially if there is a change in a mole's shape or color.  Also tell your health care provider if you have a mole that is larger than the size of a pencil eraser.  Always use sunscreen. Apply sunscreen liberally and repeatedly throughout the day.  Protect yourself by wearing long sleeves, pants, a wide-brimmed hat, and sunglasses whenever you are outside. HEART DISEASE, DIABETES, AND HIGH BLOOD PRESSURE   Have your blood pressure checked at least every 1-2 years. High blood pressure causes heart disease and increases the risk of stroke.  If you are between 19 years and 40 years old, ask your health care provider if you should take aspirin to prevent strokes.  Have regular diabetes screenings. This involves  taking a blood sample to check your fasting blood sugar level.  If you are at a normal weight and have a low risk for diabetes, have this test once every three years after 71 years of age.  If you are overweight and have a high risk for diabetes, consider being tested at a younger age or more often. PREVENTING INFECTION  Hepatitis B  If you have a higher risk for hepatitis B, you should be screened for this virus. You are considered at high risk for hepatitis B if:  You were born in a country where hepatitis B is common. Ask your health care provider which countries are considered high risk.  Your parents were born in a high-risk country, and you have not been immunized against hepatitis B (hepatitis B vaccine).  You have HIV or AIDS.  You use needles to inject street drugs.  You live with someone who has hepatitis B.  You have had sex with someone who has hepatitis B.  You get hemodialysis treatment.  You take certain medicines for conditions, including cancer, organ transplantation, and autoimmune conditions. Hepatitis C  Blood testing is recommended for:  Everyone born from [birth year] through 1966.  Anyone with known risk factors for hepatitis C. Sexually transmitted infections (STIs)  You should be screened for sexually transmitted infections (STIs) including gonorrhea and chlamydia if:  You are sexually active and are younger than 71 years of age.  You are older than 71 years of age and your health care provider tells you that you are at risk for this type of infection.  Your sexual activity has changed since you were last screened and you are at an increased risk for chlamydia or gonorrhea. Ask your health care provider if you are at risk.  If you do not have HIV, but are at risk, it may be recommended that you take a prescription medicine daily to prevent HIV infection. This is called pre-exposure prophylaxis (PrEP). You are considered at risk if:  You are sexually active  and do not regularly use condoms or know the HIV status of your partner(s).  You take drugs by injection.  You are sexually active with a partner who has HIV. Talk with your health care provider about whether you are at high risk of being infected with HIV. If you choose to begin PrEP, you should first  be tested for HIV. You should then be tested every 3 months for as long as you are taking PrEP.  PREGNANCY   If you are premenopausal and you may become pregnant, ask your health care provider about preconception counseling.  If you may become pregnant, take 400 to 800 micrograms (mcg) of folic acid every day.  If you want to prevent pregnancy, talk to your health care provider about birth control (contraception). OSTEOPOROSIS AND MENOPAUSE   Osteoporosis is a disease in which the bones lose minerals and strength with aging. This can result in serious bone fractures. Your risk for osteoporosis can be identified using a bone density scan.  If you are 6 years of age or older, or if you are at risk for osteoporosis and fractures, ask your health care provider if you should be screened.  Ask your health care provider whether you should take a calcium or vitamin D supplement to lower your risk for osteoporosis.  Menopause may have certain physical symptoms and risks.  Hormone replacement therapy may reduce some of these symptoms and risks. Talk to your health care provider about whether hormone replacement therapy is right for you.  HOME CARE INSTRUCTIONS   Schedule regular health, dental, and eye exams.  Stay current with your immunizations.   Do not use any tobacco products including cigarettes, chewing tobacco, or electronic cigarettes.  If you are pregnant, do not drink alcohol.  If you are breastfeeding, limit how much and how often you drink alcohol.  Limit alcohol intake to no more than 1 drink per day for nonpregnant women. One drink equals 12 ounces of beer, 5 ounces of wine,  or 1 ounces of hard liquor.  Do not use street drugs.  Do not share needles.  Ask your health care provider for help if you need support or information about quitting drugs.  Tell your health care provider if you often feel depressed.  Tell your health care provider if you have ever been abused or do not feel safe at home. Document Released: 09/13/2010 Document Revised: 07/15/2013 Document Reviewed: 01/30/2013 [facility] Patient Information 2015 [place], [place]. This information is not intended to replace advice given to you by your health care provider. Make sure you discuss any questions you have with your health care provider.

## 2015-06-25 NOTE — Progress Notes (Signed)
    Linda KehrJudith A Lucas 08-14-1945 [id number]        70 y.o.  G2P2001  for breast and pelvic exam. Several issues noted below.  Past medical history,surgical history, problem list, medications, allergies, family history and social history were all reviewed and documented as reviewed in the EPIC chart.  ROS:  Performed with pertinent positives and negatives included in the history, assessment and plan.   Additional significant findings :  none   Exam: Kennon PortelaKim Gardner assistant Filed Vitals:   06/25/15 1358  BP: 124/78  Height: 5\' 3"  (1.6 m)  Weight: 190 lb (86.183 kg)   General appearance:  Normal affect, orientation and appearance. Skin: Grossly normal HEENT: Without gross lesions.  No cervical or supraclavicular adenopathy. Thyroid normal.  Lungs:  Clear without wheezing, rales or rhonchi Cardiac: RR, without RMG Abdominal:  Soft, nontender, without masses, guarding, rebound, organomegaly or hernia Breasts:  Examined lying and sitting without masses, retractions, discharge or axillary adenopathy. Pelvic:  Ext/BUS/vagina with atrophic changes  Adnexa without masses or tenderness    Anus and perineum normal   Rectovaginal normal sphincter tone without palpated masses or tenderness.    Assessment/Plan:  70 y.o. 712P2001 female for breast and pelvic exam.   1. Postmenopausal/atrophic genital changes.  Status post TAH in the past. On estradiol 1 mg daily. I reviewed with her the risks particularly with advancing age to include stroke heart attack DVT and possible breast cancer.  The WHI study and current recommendations for lowest dose per shortest period of time discussed. Patient feels overall better when she is on ERT. She feels that they quality-of-life issue. I did ask her to break the tablet in half to take 0.5 mg daily and see how she does with this. If she does well with initial stay on it this year. If not then she'll continue the 1 mg at her choice. She is also continuing to have issues  with vaginal dryness but admits to not using the Premarin cream. She does have a prescription at home. I discussed the need to use it twice weekly on a regular basis to allow for mucosal thickening over time and it does take time to see results.  Patient will decide if she feels it is worth twice weekly application. She'll call me if she needs more cream. 2. Pap smear 2009. No Pap smear done today. No history of significant abnormal Pap smears. Status post hysterectomy for benign indications. Over the age of 70. We both agree to stop screening per current screening guidelines. 3. Colonoscopy thousand 15 with planned repeat next year due to having found polyps before. Patient will arrange. 4. Mammography due now and patient has scheduled. SBE monthly reviewed. 5. DEXA 2013 normal. Plan repeat DEXA in another year or 2. Increase calcium and vitamin D. 6. Health maintenance. No routine lab work done as this is done at her primary physician's office. Follow up 1 year, sooner as needed.   Dara LordsFONTAINE,Nakkia Mackiewicz P MD, 2:35 PM 06/25/2015

## 2015-06-29 DIAGNOSIS — H401133 Primary open-angle glaucoma, bilateral, severe stage: Secondary | ICD-10-CM | POA: Insufficient documentation

## 2015-07-09 ENCOUNTER — Ambulatory Visit: Payer: Medicare Other

## 2015-07-22 ENCOUNTER — Ambulatory Visit
Admission: RE | Admit: 2015-07-22 | Discharge: 2015-07-22 | Disposition: A | Discharge status: Home / Self Care | Payer: Medicare Other | Source: Ambulatory Visit

## 2015-07-22 DIAGNOSIS — Z1231 Encounter for screening mammogram for malignant neoplasm of breast: Secondary | ICD-10-CM

## 2015-08-13 DIAGNOSIS — N183 Chronic kidney disease, stage 3 unspecified: Secondary | ICD-10-CM | POA: Insufficient documentation

## 2015-08-17 ENCOUNTER — Other Ambulatory Visit: Payer: Self-pay | Admitting: Nephrology

## 2015-08-17 DIAGNOSIS — N183 Chronic kidney disease, stage 3 unspecified: Secondary | ICD-10-CM

## 2015-08-19 ENCOUNTER — Ambulatory Visit
Admission: RE | Admit: 2015-08-19 | Discharge: 2015-08-19 | Disposition: A | Discharge status: Home / Self Care | Payer: Medicare Other | Source: Ambulatory Visit | Attending: Nephrology | Admitting: Nephrology

## 2015-08-19 DIAGNOSIS — N183 Chronic kidney disease, stage 3 unspecified: Secondary | ICD-10-CM

## 2015-08-20 ENCOUNTER — Other Ambulatory Visit: Payer: Medicare Other

## 2015-10-12 ENCOUNTER — Other Ambulatory Visit: Payer: Self-pay | Admitting: *Deleted

## 2015-11-05 ENCOUNTER — Telehealth: Payer: Self-pay | Admitting: *Deleted

## 2015-11-05 NOTE — Telephone Encounter (Signed)
(  pt aware you are out of the office) Pt called asking for refill on premarin vaginal cream for vaginal dryness, Rx will be sent per note,also mentioned refill on diflucan 200 mg # 5 tablet that was prescribed on 10/17/14. C/o vaginal itching and irritation, would like same amount of pill to have on hand to take as needed. Please advise

## 2015-11-06 MED ORDER — FLUCONAZOLE 200 MG PO TABS: ORAL_TABLET | ORAL | 0 refills | Status: DC

## 2015-11-06 MED ORDER — ESTROGENS, CONJUGATED 0.625 MG/GM VA CREA
TOPICAL_CREAM | VAGINAL | 2 refills | Status: DC
Start: 2015-11-06 — End: 2016-06-28

## 2015-11-06 NOTE — Telephone Encounter (Signed)
Pt aware Rx sent.  

## 2015-11-06 NOTE — Telephone Encounter (Signed)
OK for both

## 2015-11-23 ENCOUNTER — Encounter: Payer: Medicare Other | Attending: Internal Medicine | Admitting: Dietician

## 2015-11-23 ENCOUNTER — Encounter: Payer: Self-pay | Admitting: Dietician

## 2015-11-23 NOTE — Patient Instructions (Signed)
Plan:  Aim for 2-3 Carb Choices per meal (30-45 grams) +/- 1 either way  Aim for 0-1 Carbs per snack if hungry  Include protein in moderation with your meals and snacks Consider reading food labels for Total Carbohydrate and Fat Grams of foods Consider  increasing your activity level by armchair exercises, PT, or Silver Sneakers for 15 minutes daily as tolerated Consider checking BG at alternate times per day as directed by MD  Continue taking medication as directed by MD  When eating out, choose baked, broiled or grilled; consider taking half home in a to go box.

## 2015-11-26 ENCOUNTER — Encounter: Payer: Self-pay | Admitting: Dietician

## 2015-12-15 ENCOUNTER — Ambulatory Visit: Payer: Medicare Other | Attending: Internal Medicine | Admitting: Physical Therapy

## 2015-12-15 ENCOUNTER — Encounter: Payer: Self-pay | Admitting: Physical Therapy

## 2015-12-15 DIAGNOSIS — R296 Repeated falls: Secondary | ICD-10-CM | POA: Diagnosis present

## 2015-12-15 DIAGNOSIS — M6281 Muscle weakness (generalized): Secondary | ICD-10-CM

## 2015-12-15 DIAGNOSIS — R2689 Other abnormalities of gait and mobility: Secondary | ICD-10-CM | POA: Diagnosis present

## 2015-12-15 DIAGNOSIS — R2681 Unsteadiness on feet: Secondary | ICD-10-CM | POA: Diagnosis present

## 2015-12-15 DIAGNOSIS — M6249 Contracture of muscle, multiple sites: Secondary | ICD-10-CM | POA: Diagnosis present

## 2015-12-16 NOTE — Therapy (Signed)
[facility] [address] [place], [place], [address] Phone: [phone]   Fax:  [phone]  Physical Therapy Evaluation  Patient Details  Name: Linda Lucas MRN: [id number] Date of Birth: 08/22/1945 Referring Provider: Talmage CoinJeffrey Kerr, MD  Encounter Date: 12/15/2015      PT End of Session - 12/15/15 1630    Visit Number 1   Number of Visits 17   Date for PT Re-Evaluation 02/11/06   Authorization Type Medicare G-Code & Progress note   PT Start Time 1401   PT Stop Time 1444   PT Time Calculation (min) 43 min   Equipment Utilized During Treatment Gait belt   Activity Tolerance Patient tolerated treatment well   Behavior During Therapy WFL for tasks assessed/performed      Past Medical History:  Diagnosis Date  . Ankle fracture    bimeolar fracture  . Chronic urethritis    frequent uti's and or urethritis  . Degenerative disc disease, lumbar 2007   bulging disc  . Depression   . Diabetes mellitus 2004  . Fibromyalgia   . Glaucoma   . Hypercholesteremia   . IBS (irritable bowel syndrome)   . 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 Migraine   . Osteoarthritis   . Overactive bladder   . Prolactin increased (HCC) 05/2002   NORMAL MRI    Past Surgical History:  Procedure Laterality Date  . ABDOMINAL HYSTERECTOMY    . APPENDECTOMY  age 70  . BREAST BIOPSY     x 2  . BURCH PROCEDURE    . CARPECTOMY HAND     LEFT FOR KEINBACH'S DZ  . CATARACT EXTRACTION    . CHOLECYSTECTOMY OPEN     WITH CDE  . CYSTOSCOPY     X 4  . FRACTURE METARSAL    . FUSION OF DISCS     C 3-4 4-5 5-6 VERTBRAES IN NECK DUE TO DEGEN. DISC DZ  . FUSION OF LEFT WRIST WITH PLATE AND SCREWS  2009   CADAVER BONE GRAFT AND APPLICATION OF HUMAN GROWTH FACTORS  .  HEMORROIDECTOMY  age 70  . HYSTEROSCOPY     D&C,HYSTEROSCOPY FOR PED FIBROID   . INTERSTIM IMPLANT PLACEMENT    . ORIF RADIAL HEAD / NECK FRACTURE  2010   RIGHT RADIAL HED EXTENDING INTO JOINT SPACE WITH PLATE AND SCREWS  . RIGHT ANKLE FRACTURE    . ROTATOR CUFF REPAIR    . TONSILLECTOMY AND ADENOIDECTOMY    . TUBAL LIGATION      There were no vitals filed for this visit.       Subjective Assessment - 12/15/15 1410    Subjective This 70yo female was referred due to balance issues with multiple falls with injuries. She was seen in emergency room 04/06/2015 with fall in parking lot resulting in mild head injury with facial laceration & fx nasal bone. Husband and patient report increasing issues with balance loss & falls. Husband reports she is too vain to use a walker or cane.    Patient is accompained by: Family member   Limitations Standing;Walking   Patient Stated Goals She would like to never fall again.    Currently in Pain? Yes   Pain Score 6   in last week, worst 10/10, best 1-2/10   Pain  Location Other (Comment)  fibramyalgia mainly above waist   Pain Descriptors / Indicators Aching   Pain Type Chronic pain   Pain Onset More than a month ago   Pain Frequency Constant   Aggravating Factors  change in weather cold & rain, falls, over doing it   Pain Relieving Factors medications, rest, heat   Effect of Pain on Daily Activities limits standing             [facility] PT Assessment - 12/15/15 1400      Assessment   Medical Diagnosis Balance issues, frequent falls   Referring Provider Talmage Coin, MD   Onset Date/Surgical Date 11/30/15  MD referral to PT   Hand Dominance Right   Prior Therapy PT for balance & strength last year     Precautions   Precautions Fall     Balance Screen   Has the patient fallen in the past 6 months Yes   How many times? 10  usually trips, nasal fx, hematomas, stitches   Has the patient had a decrease in activity level because of a fear  of falling?  Yes   Is the patient reluctant to leave their home because of a fear of falling?  No     Home [employer]ist information centre manager residence   Living Arrangements Spouse/significant other   Type of Home House  condo   Home Access Stairs to enter   Entrance Stairs-Number of Steps 1   Entrance Stairs-Rails None   Home Layout One level   Home Equipment None     Prior Function   Level of Independence Independent;Independent with gait   Vocation Retired   Leisure traveling,      Posture/Postural Control   Posture/Postural Control Postural limitations   Postural Limitations Rounded Shoulders;Forward head     ROM / Strength   AROM / PROM / Strength AROM;Strength     AROM   Lucas AROM  Deficits   Lucas AROM Comments Right shoulder 75* flexion due to rotator cuff tear   AROM Assessment Site Hip;Ankle   Right/Left Hip Right;Left   Right Hip Extension -30  standing with BUE support   Left Hip Extension -30  standing with BUE support   Right/Left Ankle Right;Left   Right Ankle Dorsiflexion -11  unable to reach neutral for foot flat position   Left Ankle Dorsiflexion -4  unable to reach neutral for foot flat position     Strength   Lucas Strength Deficits   Lucas Strength Comments Right shoulder 3-/5 with rotator cuff tear   Strength Assessment Site Hip;Knee;Ankle   Right/Left Hip Right;Left   Right Hip Flexion 4/5   Right Hip Extension 3-/5  tested in standing with BUE support   Right Hip ABduction 3/5  tested in standing with BUE support   Left Hip Flexion 4-/5   Left Hip Extension 3-/5  tested in standing with BUE support   Left Hip ABduction 4-/5  tested in standing with BUE support   Right/Left Knee Right;Left   Right Knee Flexion 3+/5  tested in standing with BUE support   Right Knee Extension 4/5   Left Knee Flexion 3+/5  tested in standing with BUE support   Left Knee Extension 4/5   Right/Left Ankle Right;Left   Right Ankle  Dorsiflexion 4/5   Right Ankle Plantar Flexion 3-/5   Left Ankle Dorsiflexion 4/5   Left Ankle Plantar Flexion 3-/5     Transfers   Transfers Sit to  Stand;Stand to Sit   Sit to Stand 5: Supervision;With upper extremity assist;With armrests;From chair/3-in-1  requires back of legs against chair to stabilize   Stand to Sit 5: Supervision;With upper extremity assist;With armrests;To chair/3-in-1  uses back of legs against chair to stabilize     Ambulation/Gait   Ambulation/Gait Yes   Ambulation/Gait Assistance 5: Supervision   Ambulation Distance (Feet) 100 Feet   Assistive device None   Gait Pattern Step-through pattern;Decreased arm swing - right;Decreased arm swing - left;Decreased stride length;Decreased hip/knee flexion - right;Decreased hip/knee flexion - left;Right flexed knee in stance;Left flexed knee in stance;Trunk flexed;Decreased trunk rotation;Wide base of support   Ambulation Surface Indoor;Level   Gait velocity 1.72 ft/sec  <1.8 ft/sec indicates high fall risk     Standardized Balance Assessment   Standardized Balance Assessment Berg Balance Test;Timed Up and Go Test;Dynamic Gait Index     Berg Balance Test   Sit to Stand Needs minimal aid to stand or to stabilize   Standing Unsupported Able to stand safely 2 minutes   Sitting with Back Unsupported but Feet Supported on Floor or Stool Able to sit safely and securely 2 minutes   Stand to Sit Uses backs of legs against chair to control descent   Transfers Able to transfer safely, definite need of hands   Standing Unsupported with Eyes Closed Able to stand 10 seconds with supervision   Standing Ubsupported with Feet Together Able to place feet together independently and stand for 1 minute with supervision   From Standing, Reach Forward with Outstretched Arm Can reach forward >5 cm safely (2")   From Standing Position, Pick up Object from Floor Able to pick up shoe, needs supervision   From Standing Position, Turn to Look  Behind Over each Shoulder Turn sideways only but maintains balance   Turn 360 Degrees Needs close supervision or verbal cueing   Standing Unsupported, Alternately Place Feet on Step/Stool Able to complete >2 steps/needs minimal assist   Standing Unsupported, One Foot in Front Able to take small step independently and hold 30 seconds   Standing on One Leg Tries to lift leg/unable to hold 3 seconds but remains standing independently   Total Score 32     Dynamic Gait Index   Level Surface Moderate Impairment   Change in Gait Speed Moderate Impairment   Gait with Horizontal Head Turns Moderate Impairment   Gait with Vertical Head Turns Moderate Impairment   Gait and Pivot Turn Moderate Impairment   Step Over Obstacle Severe Impairment   Step Around Obstacles Moderate Impairment   Steps Moderate Impairment   Total Score 7     Timed Up and Go Test   Normal TUG (seconds) 18.59  >13.5 sec indicates fall risk                             PT Short Term Goals - 12/15/15 1630      PT SHORT TERM GOAL #1   Title Patient demonstrates understanding of initial HEP. (Target Date: 01/15/2016)   Time 4   Period Weeks   Status New     PT SHORT TERM GOAL #2   Title Berg Balance >/= 36/56. (Target Date: 01/15/2016)   Time 4   Period Weeks   Status New     PT SHORT TERM GOAL #3   Title Patient verbalizes understanding of fall prevention strategies including recommendation of assistive device. (Target Date: 01/15/2016)  Time 4   Period Weeks   Status New     PT SHORT TERM GOAL #4   Title Patient ambulates with LRAD with head turns to scan environment and maintains path with supervision. (Target Date: 01/15/2016)   Time 4   Period Weeks   Status New           PT Long Term Goals - 12/16/15 1218      PT LONG TERM GOAL #1   Title Patient verbalizes & demonstrates understanding of ongoing HEP / fitness plan. (Target Date: 02/12/2016)   Time 8   Period Weeks   Status New      PT LONG TERM GOAL #2   Title Timed Up & Go without device <13.5sec safely to indicate lower fall risk. (Target Date: 02/12/2016)   Time 8   Period Weeks   Status New     PT LONG TERM GOAL #3   Title Berg Balance >40/56 to indicate lower fall risk. (Target Date: 02/12/2016)   Time 8   Period Weeks   Status New     PT LONG TERM GOAL #4   Title Dynamic Gait Index >13/24 to indicate lower fall risk. (Target Date: 02/12/2016)   Time 8   Period Weeks   Status New     PT LONG TERM GOAL #5   Title Patient ambulates with LRAD for safety to reduce fall risk 400' including ramps & curbs modified independent for community mobility. (Target Date: 02/12/2016)   Time 8   Period Weeks   Status New               Plan - Dec 20, 2015 1445    Clinical Impression Statement This 70yo female appears to have high fall risk with decline in her mobility and increased falls with injuries. Timed Up-Go was 18.59sec and Berg Balance 32/56 both indicating high fall risk. She has tightness in gastroc, hamstring & hip flexor muscles and impaired posture increasing fall risk. Her gait velocity of 1.72 ft/sec, deviations & Dynamic Gait Index 7/24 all indicate high fall risk with gait. Patient's condition is evolving & plan of care is moderate. Patient would benefit from skilled physical therapy to improve mobility & decrease fall risk.     Rehab Potential Good   PT Frequency 2x / week   PT Duration 8 weeks   PT Treatment/Interventions ADLs/Self Care Home Management;DME Instruction;Gait training;Stair training;Functional mobility training;Therapeutic activities;Therapeutic exercise;Balance training;Neuromuscular re-education;Patient/family education;Passive range of motion   PT Next Visit Plan Assess gait with various assistive devices, initiate HEP for strength & flexibility   Consulted and Agree with Plan of Care Patient      Patient will benefit from skilled therapeutic intervention in order to improve the  following deficits and impairments:  Abnormal gait, Decreased activity tolerance, Decreased balance, Decreased endurance, Decreased knowledge of use of DME, Decreased mobility, Decreased strength, Impaired flexibility, Postural dysfunction, Pain  Visit Diagnosis: Other abnormalities of gait and mobility  Muscle weakness (generalized)  Unsteadiness on feet  Repeated falls  Contracture of muscle, multiple sites      G-Codes - 12/20/15 1630    Functional Assessment Tool Used Berg Balance 32/56   Functional Limitation Mobility: Walking and moving around   Mobility: Walking and Moving Around Current Status [phone]) At least 60 percent but less than 80 percent impaired, limited or restricted   Mobility: Walking and Moving Around Goal Status ([id number]) At least 40 percent but less than 60 percent impaired, limited or restricted  Problem List Patient Active Problem List   Diagnosis Date Noted  . Difficulty walking 06/19/2014  . Overactive bladder   . Migraine   . Fibromyalgia   . Chronic fatigue syndrome   . Metatarsal fracture   . Diabetes mellitus   . Ankle fracture   . Osteoarthritis   . IBS (irritable bowel syndrome)   . Depression   . Chronic urethritis   . Degenerative disc disease, lumbar   . Hypercholesteremia   . Prolactin increased (HCC)     Oswald Pott PT, DPT 12/16/2015, 3:27 PM  [facility] [address] [place], [place], [address] Phone: [phone]   Fax:  [phone]  Name: Linda Lucas MRN: [id number] Date of Birth: 30-Jun-1945

## 2015-12-30 ENCOUNTER — Encounter: Payer: Self-pay | Admitting: Physical Therapy

## 2015-12-30 ENCOUNTER — Ambulatory Visit: Payer: Medicare Other | Admitting: Physical Therapy

## 2015-12-30 DIAGNOSIS — R296 Repeated falls: Secondary | ICD-10-CM

## 2015-12-30 DIAGNOSIS — M6281 Muscle weakness (generalized): Secondary | ICD-10-CM

## 2015-12-30 DIAGNOSIS — R2689 Other abnormalities of gait and mobility: Secondary | ICD-10-CM | POA: Diagnosis not present

## 2015-12-30 DIAGNOSIS — R2681 Unsteadiness on feet: Secondary | ICD-10-CM

## 2015-12-31 NOTE — Therapy (Signed)
[facility] [address] [place], [place], [address] Phone: [phone]   Fax:  [phone]  Physical Therapy Treatment  Patient Details  Name: Linda KehrJudith A Lucas MRN: [id number] Date of Birth: 1945-09-26 Referring Provider: Talmage CoinJeffrey Kerr, MD  Encounter Date: 12/30/2015   12/30/15 1237  PT Visits / Re-Eval  Visit Number 2  Number of Visits 17  Date for PT Re-Evaluation 02/11/06  Authorization  Authorization Type Medicare G-Code & Progress note  PT Time Calculation  PT Start Time 1232  PT Stop Time 1315  PT Time Calculation (min) 43 min  PT - End of Session  Equipment Utilized During Treatment Gait belt  Activity Tolerance Patient tolerated treatment well  Behavior During Therapy [facility] [place]FL for tasks assessed/performed     Past Medical History:  Diagnosis Date  . Ankle fracture    bimeolar fracture  . Chronic urethritis    frequent uti's and or urethritis  . Degenerative disc disease, lumbar 2007   bulging disc  . Depression   . Diabetes mellitus 2004  . Fibromyalgia   . Glaucoma   . Hypercholesteremia   . IBS (irritable bowel syndrome)   . 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 Migraine   . Osteoarthritis   . Overactive bladder   . Prolactin increased (HCC) 05/2002   NORMAL MRI    Past Surgical History:  Procedure Laterality Date  . ABDOMINAL HYSTERECTOMY    . APPENDECTOMY  age 70  . BREAST BIOPSY     x 2  . BURCH PROCEDURE    . CARPECTOMY HAND     LEFT FOR KEINBACH'S DZ  . CATARACT EXTRACTION    . CHOLECYSTECTOMY OPEN     WITH CDE  . CYSTOSCOPY     X 4  . FRACTURE METARSAL    . FUSION OF DISCS     C 3-4 4-5 5-6 VERTBRAES IN NECK DUE TO DEGEN. DISC DZ  . FUSION OF LEFT WRIST WITH PLATE AND SCREWS  2009   CADAVER BONE GRAFT AND  APPLICATION OF HUMAN GROWTH FACTORS  . HEMORROIDECTOMY  age 70  . HYSTEROSCOPY     D&C,HYSTEROSCOPY FOR PED FIBROID   . INTERSTIM IMPLANT PLACEMENT    . ORIF RADIAL HEAD / NECK FRACTURE  2010   RIGHT RADIAL HED EXTENDING INTO JOINT SPACE WITH PLATE AND SCREWS  . RIGHT ANKLE FRACTURE    . ROTATOR CUFF REPAIR    . TONSILLECTOMY AND ADENOIDECTOMY    . TUBAL LIGATION      There were no vitals filed for this visit.   12/30/15 1236  Symptoms/Limitations  Subjective No new complaints. No new falls. No change in fibromyalgia pain, mostly in shoulder and hips.   Patient is accompained by: Family member  Limitations Standing;Walking  Patient Stated Goals She would like to never fall again.   Pain Assessment  Currently in Pain? Yes  Pain Score 4  Pain Location Generalized (fibromyalgia pain)  Pain Descriptors / Indicators Aching  Pain Type Chronic pain  Pain Onset More than a month ago  Pain Frequency Constant  Aggravating Factors  change in weather cold and rain, falls, over doing it  Pain Relieving Factors medications, rest, heat      12/30/15 1243  Transfers  Transfers Sit to Stand;Stand to Sit  Sit to  Stand 5: Supervision;With upper extremity assist;With armrests;From chair/3-in-1  Stand to Sit 5: Supervision;With upper extremity assist;With armrests;To chair/3-in-1  Ambulation/Gait  Ambulation/Gait Yes  Ambulation/Gait Assistance 5: Supervision  Ambulation/Gait Assistance Details trialed various AD with gait. pt demo's increased step length and gait speed with rollator. Step length slightly improved with use of RW and cane. Pt agreeable ot cane purchase at this time.   Ambulation Distance (Feet) 115 Feet (x 4; 230 x1; 450 x1 with rollator)  Assistive device Straight cane;Rolling walker;Rollator  Gait Pattern Step-through pattern;Decreased arm swing - right;Decreased arm swing - left;Decreased stride length;Decreased hip/knee flexion - right;Decreased hip/knee flexion -  left;Right flexed knee in stance;Left flexed knee in stance;Trunk flexed;Decreased trunk rotation;Wide base of support  Ambulation Surface Level;Indoor           PT Short Term Goals - 12/15/15 1630      PT SHORT TERM GOAL #1   Title Patient demonstrates understanding of initial HEP. (Target Date: 01/15/2016)   Time 4   Period Weeks   Status New     PT SHORT TERM GOAL #2   Title Berg Balance >/= 36/56. (Target Date: 01/15/2016)   Time 4   Period Weeks   Status New     PT SHORT TERM GOAL #3   Title Patient verbalizes understanding of fall prevention strategies including recommendation of assistive device. (Target Date: 01/15/2016)   Time 4   Period Weeks   Status New     PT SHORT TERM GOAL #4   Title Patient ambulates with LRAD with head turns to scan environment and maintains path with supervision. (Target Date: 01/15/2016)   Time 4   Period Weeks   Status New           PT Long Term Goals - 12/16/15 1218      PT LONG TERM GOAL #1   Title Patient verbalizes & demonstrates understanding of ongoing HEP / fitness plan. (Target Date: 02/12/2016)   Time 8   Period Weeks   Status New     PT LONG TERM GOAL #2   Title Timed Up & Go without device <13.5sec safely to indicate lower fall risk. (Target Date: 02/12/2016)   Time 8   Period Weeks   Status New     PT LONG TERM GOAL #3   Title Berg Balance >40/56 to indicate lower fall risk. (Target Date: 02/12/2016)   Time 8   Period Weeks   Status New     PT LONG TERM GOAL #4   Title Dynamic Gait Index >13/24 to indicate lower fall risk. (Target Date: 02/12/2016)   Time 8   Period Weeks   Status New     PT LONG TERM GOAL #5   Title Patient ambulates with LRAD for safety to reduce fall risk 400' including ramps & curbs modified independent for community mobility. (Target Date: 02/12/2016)   Time 8   Period Weeks   Status New        12/30/15 1237  Plan  Clinical Impression Statement today's skilled session focused on  trials of various AD's with gait to decreased pt's fall risk and increase her safety with gait/mobility. Pt continues to be resistant to use of an AD, however she understands the benefits and purpose/reasoning behind using one. Pt was most stable with use of a rollator vs RW/straight cane, however was only willing to get a cane at this time. She does state she is open to the rollator if it becomes the  only option to prevent falls at the end of the plan of care. Pt should benefit from continued PT to progress toward unmet goals.                             Pt will benefit from skilled therapeutic intervention in order to improve on the following deficits Abnormal gait;Decreased activity tolerance;Decreased balance;Decreased endurance;Decreased knowledge of use of DME;Decreased mobility;Decreased strength;Impaired flexibility;Postural dysfunction;Pain  Rehab Potential Good  PT Frequency 2x / week  PT Duration 8 weeks  PT Treatment/Interventions ADLs/Self Care Home Management;DME Instruction;Gait training;Stair training;Functional mobility training;Therapeutic activities;Therapeutic exercise;Balance training;Neuromuscular re-education;Patient/family education;Passive range of motion  PT Next Visit Plan initiate HEP for strength & flexibility     Patient will benefit from skilled therapeutic intervention in order to improve the following deficits and impairments:  Abnormal gait, Decreased activity tolerance, Decreased balance, Decreased endurance, Decreased knowledge of use of DME, Decreased mobility, Decreased strength, Impaired flexibility, Postural dysfunction, Pain  Visit Diagnosis: Other abnormalities of gait and mobility  Muscle weakness (generalized)  Unsteadiness on feet  Repeated falls     Problem List Patient Active Problem List   Diagnosis Date Noted  . Difficulty walking 06/19/2014  . Overactive bladder   . Migraine   . Fibromyalgia   . Chronic fatigue syndrome   . Metatarsal  fracture   . Diabetes mellitus   . Ankle fracture   . Osteoarthritis   . IBS (irritable bowel syndrome)   . Depression   . Chronic urethritis   . Degenerative disc disease, lumbar   . Hypercholesteremia   . Prolactin increased (HCC)     Linda Lucas, Linda Lucas, Linda Lucas [address], [address] [place], [place] [address] [phone] 12/31/15, 9:35 PM   Name: Linda Lucas MRN: [id number] Date of Birth: 1945-12-27

## 2016-01-04 ENCOUNTER — Ambulatory Visit: Payer: Medicare Other | Admitting: Physical Therapy

## 2016-01-04 ENCOUNTER — Encounter: Payer: Self-pay | Admitting: Physical Therapy

## 2016-01-04 DIAGNOSIS — R296 Repeated falls: Secondary | ICD-10-CM

## 2016-01-04 DIAGNOSIS — R2681 Unsteadiness on feet: Secondary | ICD-10-CM

## 2016-01-04 DIAGNOSIS — M6249 Contracture of muscle, multiple sites: Secondary | ICD-10-CM

## 2016-01-04 DIAGNOSIS — R2689 Other abnormalities of gait and mobility: Secondary | ICD-10-CM

## 2016-01-04 DIAGNOSIS — M6281 Muscle weakness (generalized): Secondary | ICD-10-CM

## 2016-01-04 NOTE — Patient Instructions (Addendum)
Bridge    Lie back, legs bent. Inhale, pressing hips up. Keeping ribs in, lengthen lower back. Exhale, rolling down along spine from top. Repeat __10__ times. Do __1-2__ sessions per day.  http://pm.exer.us/55   Copyright  VHI. All rights reserved.  HIP: Flexion / KNEE: Extension, Straight Leg Raise    Raise leg, keeping knee straight. Perform slowly. _10_ reps per set, _1-2_ sets per day.  Copyright  VHI. All rights reserved.  Advanced Straight Leg Raise   Strengthening: Hip Abductor - Resisted    With band looped around both legs above knees, push thighs apart. LAY DOWN FLAT. Repeat __10__ times per set. Do _1-2_ sets per day.  http://orth.exer.us/688   Copyright  VHI. All rights reserved.  Functional Quadriceps: Sit to Stand    Sit on edge of chair, feet flat on floor. Using as little as possible, stand upright, extending knees fully.  Repeat __10__ times per set. Do _1-2_ sets per day.  http://orth.exer.us/735   Copyright  VHI. All rights reserved.  Chair Sitting    Sit at edge of seat, spine straight, one leg extended. Put a hand on each thigh and bend forward from the hip, keeping spine straight. Allow hand on extended leg to reach toward toes. Support upper body with other arm. Hold _30_ seconds. Repeat _3_ times per day on each side  Copyright  VHI. All rights reserved.  TRUNK: Rotation    Sit at edge of sitting surface with upright posture. Turn at waist to look over shoulder. Hold for  _15_ seconds, _3_ sets per day on each side.   Copyright  VHI. All rights reserved.  Achilles / Gastroc, Standing    Stand with hands supported on wall, elbows slightly bent, feet parallel and both heels on floor, front knee bent, back knee straight. Slowly relax back knee until a stretch is felt in achilles tendon. Hold __15__ seconds, 3 sets per day. Repeat with leg positions switched 

## 2016-01-04 NOTE — Therapy (Signed)
[facility] [address] [place], [place], [address] Phone: [phone]   Fax:  [phone]  Physical Therapy Treatment  Patient Details  Name: Linda Lucas MRN: [id number] Date of Birth: October 13, 1945 Referring Provider: Talmage Coin, MD  Encounter Date: 01/04/2016      PT End of Session - 01/04/16 1454    Visit Number 3   Number of Visits 17   Date for PT Re-Evaluation 02/11/06   Authorization Type Medicare G-Code & Progress note   PT Start Time 1402   PT Stop Time 1445   PT Time Calculation (min) 43 min   Activity Tolerance Patient tolerated treatment well   Behavior During Therapy [facility] for tasks assessed/performed      Past Medical History:  Diagnosis Date  . Ankle fracture    bimeolar fracture  . Chronic urethritis    frequent uti's and or urethritis  . Degenerative disc disease, lumbar 2007   bulging disc  . Depression   . Diabetes mellitus 2004  . Fibromyalgia   . Glaucoma   . Hypercholesteremia   . IBS (irritable bowel syndrome)   . 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 Migraine   . Osteoarthritis   . Overactive bladder   . Prolactin increased (HCC) 05/2002   NORMAL MRI    Past Surgical History:  Procedure Laterality Date  . ABDOMINAL HYSTERECTOMY    . APPENDECTOMY  age 68  . BREAST BIOPSY     x 2  . BURCH PROCEDURE    . CARPECTOMY HAND     LEFT FOR KEINBACH'S DZ  . CATARACT EXTRACTION    . CHOLECYSTECTOMY OPEN     WITH CDE  . CYSTOSCOPY     X 4  . FRACTURE METARSAL    . FUSION OF DISCS     C 3-4 4-5 5-6 VERTBRAES IN NECK DUE TO DEGEN. DISC DZ  . FUSION OF LEFT WRIST WITH PLATE AND SCREWS  2009   CADAVER BONE GRAFT AND APPLICATION OF HUMAN GROWTH FACTORS  . HEMORROIDECTOMY  age 7  . HYSTEROSCOPY     D&C,HYSTEROSCOPY FOR PED FIBROID   . INTERSTIM IMPLANT PLACEMENT    . ORIF RADIAL HEAD / NECK FRACTURE  2010   RIGHT RADIAL HED EXTENDING INTO JOINT SPACE WITH PLATE AND SCREWS  . RIGHT ANKLE FRACTURE    . ROTATOR CUFF REPAIR    . TONSILLECTOMY AND ADENOIDECTOMY    . TUBAL LIGATION      There were no vitals filed for this visit.      Subjective Assessment - 01/04/16 1407    Subjective Patient arrived at clinic with rubber tip cane. She reports almost falling when getting dressed and sitting EOB, but she was able to grab the blanket to keep herself from falling   Limitations Standing;Walking   Patient Stated Goals She would like to never fall again.    Currently in Pain? Yes   Pain Score 6    Pain Location Generalized   Pain Onset More than a month ago   Pain Frequency Constant   Aggravating Factors  Change in weather, cold, and rain; falls; over doing it   Pain Relieving Factors medications, rest, heat   Effect of Pain on Daily Activities limits standing   Multiple Pain Sites No  Treatment: Issued the following to [employer] back, legs bent. Inhale, pressing hips up. Keeping ribs in, lengthen lower back. Exhale, rolling down along spine from top. Repeat __10__ times. Do __1-2__ sessions per day.  http://pm.exer.us/55   Copyright  VHI. All rights reserved.  HIP: Flexion / KNEE: Extension, Straight Leg Raise    Raise leg, keeping knee straight. Perform slowly. _10_ reps per set, _1-2_ sets per day.  Copyright  VHI. All rights reserved.  Advanced Straight Leg Raise   Strengthening: Hip Abductor - Resisted    With band looped around both legs above knees, push thighs apart. LAY DOWN FLAT. Repeat __10__ times per set. Do _1-2_ sets per day.  http://orth.exer.us/688   Copyright  VHI. All rights reserved.  Functional Quadriceps: Sit to Stand    Sit on edge of chair, feet flat on floor. Using as little as possible, stand upright, extending  knees fully.  Repeat __10__ times per set. Do _1-2_ sets per day.  http://orth.exer.us/735   Copyright  VHI. All rights reserved.  Chair Sitting    Sit at edge of seat, spine straight, one leg extended. Put a hand on each thigh and bend forward from the hip, keeping spine straight. Allow hand on extended leg to reach toward toes. Support upper body with other arm. Hold _30_ seconds. Repeat _3_ times per day on each side  Copyright  VHI. All rights reserved.  TRUNK: Rotation    Sit at edge of sitting surface with upright posture. Turn at waist to look over shoulder. Hold for  _15_ seconds, _3_ sets per day on each side.   Copyright  VHI. All rights reserved.  Achilles / Gastroc, Standing    Stand with hands supported on wall, elbows slightly bent, feet parallel and both heels on floor, front knee bent, back knee straight. Slowly relax back knee until a stretch is felt in achilles tendon. Hold __15__ seconds, 3 sets per day. Repeat with leg positions switched        PT Education - 01/04/16 1451    Education provided Yes   Education Details HEP provided; see pt. instructions   Person(s) Educated Patient   Methods Explanation;Demonstration;Verbal cues;Handout   Comprehension Returned demonstration;Need further instruction          PT Short Term Goals - 12/15/15 1630      PT SHORT TERM GOAL #1   Title Patient demonstrates understanding of initial HEP. (Target Date: 01/15/2016)   Time 4   Period Weeks   Status New     PT SHORT TERM GOAL #2   Title Berg Balance >/= 36/56. (Target Date: 01/15/2016)   Time 4   Period Weeks   Status New     PT SHORT TERM GOAL #3   Title Patient verbalizes understanding of fall prevention strategies including recommendation of assistive device. (Target Date: 01/15/2016)   Time 4   Period Weeks   Status New     PT SHORT TERM GOAL #4   Title Patient ambulates with LRAD with head turns to scan environment and maintains path with  supervision. (Target Date: 01/15/2016)   Time 4   Period Weeks   Status New           PT Long Term Goals - 12/16/15 1218      PT LONG TERM GOAL #1   Title Patient verbalizes & demonstrates understanding of ongoing HEP / fitness plan. (Target Date: 02/12/2016)   Time 8  Period Weeks   Status New     PT LONG TERM GOAL #2   Title Timed Up & Go without device <13.5sec safely to indicate lower fall risk. (Target Date: 02/12/2016)   Time 8   Period Weeks   Status New     PT LONG TERM GOAL #3   Title Berg Balance >40/56 to indicate lower fall risk. (Target Date: 02/12/2016)   Time 8   Period Weeks   Status New     PT LONG TERM GOAL #4   Title Dynamic Gait Index >13/24 to indicate lower fall risk. (Target Date: 02/12/2016)   Time 8   Period Weeks   Status New     PT LONG TERM GOAL #5   Title Patient ambulates with LRAD for safety to reduce fall risk 400' including ramps & curbs modified independent for community mobility. (Target Date: 02/12/2016)   Time 8   Period Weeks   Status New           Plan - 01/04/16 1456    Clinical Impression Statement Today's skilled sesson focused on providing patient with an HEP to increase patient's self management, independence, strength and mobility. Patient was able to perform all exercise after demonstration was provided. VC for proper technique.. Patient continues to need cues for correct use of cane. Pt is progressing toward goals and should benefit from continued PT to progress toward unmet goals.                        Rehab Potential Good   PT Frequency 2x / week   PT Duration 8 weeks   PT Treatment/Interventions ADLs/Self Care Home Management;DME Instruction;Gait training;Stair training;Functional mobility training;Therapeutic activities;Therapeutic exercise;Balance training;Neuromuscular re-education;Patient/family education;Passive range of motion   PT Next Visit Plan Review HEP and instruct patient on proper use of cane   Consulted  and Agree with Plan of Care Patient      Patient will benefit from skilled therapeutic intervention in order to improve the following deficits and impairments:  Abnormal gait, Decreased activity tolerance, Decreased balance, Decreased endurance, Decreased knowledge of use of DME, Decreased mobility, Decreased strength, Impaired flexibility, Postural dysfunction, Pain  Visit Diagnosis: Muscle weakness (generalized)  Other abnormalities of gait and mobility  Unsteadiness on feet  Repeated falls  Contracture of muscle, multiple sites     Problem List Patient Active Problem List   Diagnosis Date Noted  . Difficulty walking 06/19/2014  . Overactive bladder   . Migraine   . Fibromyalgia   . Chronic fatigue syndrome   . Metatarsal fracture   . Diabetes mellitus   . Ankle fracture   . Osteoarthritis   . IBS (irritable bowel syndrome)   . Depression   . Chronic urethritis   . Degenerative disc disease, lumbar   . Hypercholesteremia   . Prolactin increased (HCC)    Kallie LocksHannah Shantell Belongia, SPTA 01/04/2016, 3:16 PM  [facility] [address] [place], [place], [address] Phone: [phone]   Fax:  [phone]  Name: Linda Lucas MRN: [id number] Date of Birth: 1945-12-15

## 2016-01-07 ENCOUNTER — Encounter: Payer: Self-pay | Admitting: Physical Therapy

## 2016-01-07 ENCOUNTER — Ambulatory Visit: Payer: Medicare Other | Admitting: Physical Therapy

## 2016-01-07 DIAGNOSIS — M6249 Contracture of muscle, multiple sites: Secondary | ICD-10-CM

## 2016-01-07 DIAGNOSIS — R2689 Other abnormalities of gait and mobility: Secondary | ICD-10-CM

## 2016-01-07 DIAGNOSIS — M6281 Muscle weakness (generalized): Secondary | ICD-10-CM

## 2016-01-07 DIAGNOSIS — R2681 Unsteadiness on feet: Secondary | ICD-10-CM

## 2016-01-07 DIAGNOSIS — R296 Repeated falls: Secondary | ICD-10-CM

## 2016-01-07 NOTE — Therapy (Signed)
[facility] [address] [place], [place], [address] Phone: [phone]   Fax:  [phone]  Physical Therapy Treatment  Patient Details  Name: Linda Lucas MRN: [id number] Date of Birth: January 29, 1946 Referring Provider: Talmage Coin, MD  Encounter Date: 01/07/2016      PT End of Session - 01/07/16 1556    Visit Number 4   Number of Visits 17   Date for PT Re-Evaluation 02/11/06   Authorization Type Medicare G-Code & Progress note   PT Start Time 1448   PT Stop Time 1535   PT Time Calculation (min) 47 min   Equipment Utilized During Treatment Gait belt   Activity Tolerance Patient tolerated treatment well   Behavior During Therapy [facility] for tasks assessed/performed      Past Medical History:  Diagnosis Date  . Ankle fracture    bimeolar fracture  . Chronic urethritis    frequent uti's and or urethritis  . Degenerative disc disease, lumbar 2007   bulging disc  . Depression   . Diabetes mellitus 2004  . Fibromyalgia   . Glaucoma   . Hypercholesteremia   . IBS (irritable bowel syndrome)   . 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 Migraine   . Osteoarthritis   . Overactive bladder   . Prolactin increased (HCC) 05/2002   NORMAL MRI    Past Surgical History:  Procedure Laterality Date  . ABDOMINAL HYSTERECTOMY    . APPENDECTOMY  age 62  . BREAST BIOPSY     x 2  . BURCH PROCEDURE    . CARPECTOMY HAND     LEFT FOR KEINBACH'S DZ  . CATARACT EXTRACTION    . CHOLECYSTECTOMY OPEN     WITH CDE  . CYSTOSCOPY     X 4  . FRACTURE METARSAL    . FUSION OF DISCS     C 3-4 4-5 5-6 VERTBRAES IN NECK DUE TO DEGEN. DISC DZ  . FUSION OF LEFT WRIST WITH PLATE AND SCREWS  2009   CADAVER BONE GRAFT AND APPLICATION OF HUMAN GROWTH FACTORS  .  HEMORROIDECTOMY  age 43  . HYSTEROSCOPY     D&C,HYSTEROSCOPY FOR PED FIBROID   . INTERSTIM IMPLANT PLACEMENT    . ORIF RADIAL HEAD / NECK FRACTURE  2010   RIGHT RADIAL HED EXTENDING INTO JOINT SPACE WITH PLATE AND SCREWS  . RIGHT ANKLE FRACTURE    . ROTATOR CUFF REPAIR    . TONSILLECTOMY AND ADENOIDECTOMY    . TUBAL LIGATION      There were no vitals filed for this visit.      Subjective Assessment - 01/07/16 1449    Subjective Patient reports she is using her cane when she leaves home, but does not use it when in the house.   Limitations Standing;Walking   Patient Stated Goals She would like to never fall again.    Pain Score 6    Pain Location Generalized   Pain Type Chronic pain   Pain Onset More than a month ago   Pain Frequency Constant   Multiple Pain Sites Yes   Pain Score 6   Pain Location Head      Reviewed HEP with patient performing 3 reps of each activity. Patient required VC to perform all activities and was unable to perform  sit <> stand without UE use due to LE weakness.       [facility] Adult PT Treatment/Exercise - 01/07/16 1544      Transfers   Transfers Sit to Stand;Stand to Sit   Sit to Stand 5: Supervision;With upper extremity assist;From chair/3-in-1   Sit to Stand Details Verbal cues for technique   Stand to Sit 5: Supervision;With upper extremity assist;To chair/3-in-1   Stand to Sit Details (indicate cue type and reason) Verbal cues for technique     Ambulation/Gait   Ambulation/Gait Yes   Ambulation/Gait Assistance 4: Min guard   Ambulation Distance (Feet) 450 Feet   Assistive device Straight cane   Gait Pattern Step-through pattern;Decreased arm swing - right;Decreased arm swing - left;Decreased stride length;Decreased hip/knee flexion - right;Decreased hip/knee flexion - left;Right flexed knee in stance;Left flexed knee in stance;Trunk flexed;Decreased trunk rotation;Wide base of support;Step-to pattern   Ambulation Surface Level;Indoor   Curb  Other (comment)  min guard   Curb Details (indicate cue type and reason) 3x; Patient instructed on correct technique to ascend and descend curbs using single point cane.   Gait Comments Frequent VC for correct technique when using cane. Reminders to use cane during ambulation, particularly when rounding corners.           Balance Exercises - 01/07/16 1552      Balance Exercises: Standing   Standing Eyes Opened Narrow base of support (BOS);Wide (BOA);Head turns;Foam/compliant surface;Limitations   Standing Eyes Closed Narrow base of support (BOS);Wide (BOA);Foam/compliant surface;30 secs     Balance Exercises: Standing   Standing Eyes Opened Limitations 30 seconds EO & EC on compliant surface with wide BOS progressing to narrow BOS. Head nods EO 3x 30 seconds.          PT Education - 01/07/16 0820    Education provided Yes   Education Details Pt educated on proper foot wear. She does not own sneakers and wears only heels. Was provided with contact information for Lucianne MussJohn Dewey at [employer] for shoe fitting.   Person(s) Educated Patient   Methods Explanation;Handout   Comprehension Verbalized understanding;Need further instruction          PT Short Term Goals - 12/15/15 1630      PT SHORT TERM GOAL #1   Title Patient demonstrates understanding of initial HEP. (Target Date: 01/15/2016)   Time 4   Period Weeks   Status New     PT SHORT TERM GOAL #2   Title Berg Balance >/= 36/56. (Target Date: 01/15/2016)   Time 4   Period Weeks   Status New     PT SHORT TERM GOAL #3   Title Patient verbalizes understanding of fall prevention strategies including recommendation of assistive device. (Target Date: 01/15/2016)   Time 4   Period Weeks   Status New     PT SHORT TERM GOAL #4   Title Patient ambulates with LRAD with head turns to scan environment and maintains path with supervision. (Target Date: 01/15/2016)   Time 4   Period Weeks   Status New           PT Long Term  Goals - 12/16/15 1218      PT LONG TERM GOAL #1   Title Patient verbalizes & demonstrates understanding of ongoing HEP / fitness plan. (Target Date: 02/12/2016)   Time 8   Period Weeks   Status New     PT LONG TERM GOAL #2   Title Timed Up & Go without  device <13.5sec safely to indicate lower fall risk. (Target Date: 02/12/2016)   Time 8   Period Weeks   Status New     PT LONG TERM GOAL #3   Title Berg Balance >40/56 to indicate lower fall risk. (Target Date: 02/12/2016)   Time 8   Period Weeks   Status New     PT LONG TERM GOAL #4   Title Dynamic Gait Index >13/24 to indicate lower fall risk. (Target Date: 02/12/2016)   Time 8   Period Weeks   Status New     PT LONG TERM GOAL #5   Title Patient ambulates with LRAD for safety to reduce fall risk 400' including ramps & curbs modified independent for community mobility. (Target Date: 02/12/2016)   Time 8   Period Weeks   Status New           Plan - 01/07/16 1557    Clinical Impression Statement Todays skilled sesson focused on educating the patient on proper use of single point cane while ambulating and on curbs to increase patients independence and prevent falls. Patient continues to demonstrat LE weakness but is progressing towards goals and should benefit from continued PT to progress towards unmet goals   Rehab Potential Good   PT Frequency 2x / week   PT Duration 8 weeks   PT Treatment/Interventions ADLs/Self Care Home Management;DME Instruction;Gait training;Stair training;Functional mobility training;Therapeutic activities;Therapeutic exercise;Balance training;Neuromuscular re-education;Patient/family education;Passive range of motion   PT Next Visit Plan [place] gait training with single point cane. LE strengthening and high level balance activities.   Consulted and Agree with Plan of Care Patient      Patient will benefit from skilled therapeutic intervention in order to improve the following deficits and  impairments:  Abnormal gait, Decreased activity tolerance, Decreased balance, Decreased endurance, Decreased knowledge of use of DME, Decreased mobility, Decreased strength, Impaired flexibility, Postural dysfunction, Pain  Visit Diagnosis: Muscle weakness (generalized)  Other abnormalities of gait and mobility  Unsteadiness on feet  Repeated falls  Contracture of muscle, multiple sites     Problem List Patient Active Problem List   Diagnosis Date Noted  . Difficulty walking 06/19/2014  . Overactive bladder   . Migraine   . Fibromyalgia   . Chronic fatigue syndrome   . Metatarsal fracture   . Diabetes mellitus   . Ankle fracture   . Osteoarthritis   . IBS (irritable bowel syndrome)   . Depression   . Chronic urethritis   . Degenerative disc disease, lumbar   . Hypercholesteremia   . Prolactin increased (HCC)     Kallie Locks, SPTA 01/08/2016, 8:24 AM  [facility] [address] [place], [place], [address] Phone: [phone]   Fax:  [phone]  Name: Linda Lucas MRN: [id number] Date of Birth: 1945-09-07

## 2016-01-13 ENCOUNTER — Ambulatory Visit: Payer: Medicare Other | Attending: Internal Medicine | Admitting: Physical Therapy

## 2016-01-13 DIAGNOSIS — M6281 Muscle weakness (generalized): Secondary | ICD-10-CM

## 2016-01-13 DIAGNOSIS — R296 Repeated falls: Secondary | ICD-10-CM | POA: Diagnosis present

## 2016-01-13 DIAGNOSIS — R2689 Other abnormalities of gait and mobility: Secondary | ICD-10-CM | POA: Insufficient documentation

## 2016-01-13 DIAGNOSIS — M6249 Contracture of muscle, multiple sites: Secondary | ICD-10-CM

## 2016-01-13 DIAGNOSIS — R2681 Unsteadiness on feet: Secondary | ICD-10-CM | POA: Insufficient documentation

## 2016-01-13 NOTE — Patient Instructions (Addendum)
Bridge    Lie back, legs bent. Inhale, pressing hips up. Keeping ribs in, lengthen lower back. Exhale, rolling down along spine from top. Repeat __10__ times. Do __1-2__ sessions per day.  http://pm.exer.us/55   Copyright  VHI. All rights reserved.  HIP: Flexion / KNEE: Extension, Straight Leg Raise    Raise leg, keeping knee straight. Perform slowly. _10_ reps per set, _1-2_ sets per day.  Copyright  VHI. All rights reserved.  Advanced Straight Leg Raise   Strengthening: Hip Abductor - Resisted    With band looped around both legs above knees, push thighs apart. LAY DOWN FLAT. Repeat __10__ times per set. Do _1-2_ sets per day.  http://orth.exer.us/688   Copyright  VHI. All rights reserved.  Functional Quadriceps: Sit to Stand    Sit on edge of chair, feet flat on floor. Using as little as possible, stand upright, extending knees fully.  Repeat __10__ times per set. Do _1-2_ sets per day.  http://orth.exer.us/735   Copyright  VHI. All rights reserved.  Chair Sitting    Sit at edge of seat, spine straight, one leg extended. Put a hand on each thigh and bend forward from the hip, keeping spine straight. Allow hand on extended leg to reach toward toes. Support upper body with other arm. Hold _30_ seconds. Repeat _3_ times per day on each side  Copyright  VHI. All rights reserved.  TRUNK: Rotation    Sit at edge of sitting surface with upright posture. Turn at waist to look over shoulder. Hold for  _15_ seconds, _3_ sets per day on each side.   Copyright  VHI. All rights reserved.  Achilles / Gastroc, Standing    Stand with hands supported on wall, elbows slightly bent, feet parallel and both heels on floor, front knee bent, back knee straight. Slowly relax back knee until a stretch is felt in achilles tendon. Hold __15__ seconds, 3 sets per day. Repeat with leg positions switched

## 2016-01-13 NOTE — Therapy (Signed)
[facility] [address] [place], [place], [address] Phone: [phone]   Fax:  [phone]  Physical Therapy Treatment  Patient Details  Name: Linda Lucas MRN: [id number] Date of Birth: 08/31/1945 Referring Provider: Jeffrey Kerr, MD  Encounter Date: 01/13/2016      PT End of Session - 01/13/16 1459    Visit Number 5   Number of Visits 17   Date for PT Re-Evaluation 02/11/06   Authorization Type Medicare G-Code & Progress note   PT Start Time 1405   PT Stop Time 1445   PT Time Calculation (min) 40 min   Activity Tolerance Patient tolerated treatment well   Behavior During Therapy WFL for tasks assessed/performed      Past Medical History:  Diagnosis Date  . Ankle fracture    bimeolar fracture  . Chronic urethritis    frequent uti's and or urethritis  . Degenerative disc disease, lumbar 2007   bulging disc  . Depression   . Diabetes mellitus 2004  . Fibromyalgia   . Glaucoma   . Hypercholesteremia   . IBS (irritable bowel syndrome)   . 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 Migraine   . Osteoarthritis   . Overactive bladder   . Prolactin increased (HCC) 05/2002   NORMAL MRI    Past Surgical History:  Procedure Laterality Date  . ABDOMINAL HYSTERECTOMY    . APPENDECTOMY  age 17  . BREAST BIOPSY     x 2  . BURCH PROCEDURE    . CARPECTOMY HAND     LEFT FOR KEINBACH'S DZ  . CATARACT EXTRACTION    . CHOLECYSTECTOMY OPEN     WITH CDE  . CYSTOSCOPY     X 4  . FRACTURE METARSAL    . FUSION OF DISCS     C 3-4 4-5 5-6 VERTBRAES IN NECK DUE TO DEGEN. DISC DZ  . FUSION OF LEFT WRIST WITH PLATE AND SCREWS  2009   CADAVER BONE GRAFT AND APPLICATION OF HUMAN GROWTH FACTORS  . HEMORROIDECTOMY  age 17  . HYSTEROSCOPY     D&C,HYSTEROSCOPY  FOR PED FIBROID   . INTERSTIM IMPLANT PLACEMENT    . ORIF RADIAL HEAD / NECK FRACTURE  2010   RIGHT RADIAL HED EXTENDING INTO JOINT SPACE WITH PLATE AND SCREWS  . RIGHT ANKLE FRACTURE    . ROTATOR CUFF REPAIR    . TONSILLECTOMY AND ADENOIDECTOMY    . TUBAL LIGATION      There were no vitals filed for this visit.      Subjective Assessment - 01/13/16 1407    Subjective Pt was sore from last PT session and weather is aggravating fibromyalgia. Unable to perform much of HEP due to pain.   Limitations Standing;Walking   Patient Stated Goals She would like to never fall again.    Currently in Pain? Yes   Pain Score 9    Pain Location Generalized   Pain Orientation Medial   Pain Descriptors / Indicators Aching   Pain Radiating Towards "waist up"   Pain Onset More than a month ago   Pain Frequency Constant                         [facility] Adult PT Treatment/Exercise - 01/13/16 0001        Exercises   Exercises Knee/Hip  Performed HEP given 10/23, with min cues, see pt instruction                PT Education - 01/13/16 1436    Education provided Yes   Education Details Fall Prevention handout; Performed and reviewed HEP for LE strengthening and stretching   Person(s) Educated Patient   Methods Explanation;Demonstration;Handout;Verbal cues   Comprehension Verbalized understanding;Returned demonstration          PT Short Term Goals - 01/13/16 1427      PT SHORT TERM GOAL #1   Title Patient demonstrates understanding of initial HEP. (Target Date: 01/15/2016)   Baseline Met, 01/13/16.   Time 4   Period Weeks   Status Achieved     PT SHORT TERM GOAL #2   Title Berg Balance >/= 36/56. (Target Date: 01/15/2016)   Time 4   Period Weeks   Status New     PT SHORT TERM GOAL #3   Title Patient verbalizes understanding of fall prevention strategies including recommendation of assistive device. (Target Date: 01/15/2016)   Baseline Pt verbalizes understanding  but is only willing to use SPC in the community; 01/13/16   Time 4   Period Weeks   Status Achieved     PT SHORT TERM GOAL #4   Title Patient ambulates with LRAD with head turns to scan environment and maintains path with supervision. (Target Date: 01/15/2016)   Time 4   Period Weeks   Status New           PT Long Term Goals - 12/16/15 1218      PT LONG TERM GOAL #1   Title Patient verbalizes & demonstrates understanding of ongoing HEP / fitness plan. (Target Date: 02/12/2016)   Time 8   Period Weeks   Status New     PT LONG TERM GOAL #2   Title Timed Up & Go without device <13.5sec safely to indicate lower fall risk. (Target Date: 02/12/2016)   Time 8   Period Weeks   Status New     PT LONG TERM GOAL #3   Title Berg Balance >40/56 to indicate lower fall risk. (Target Date: 02/12/2016)   Time 8   Period Weeks   Status New     PT LONG TERM GOAL #4   Title Dynamic Gait Index >13/24 to indicate lower fall risk. (Target Date: 02/12/2016)   Time 8   Period Weeks   Status New     PT LONG TERM GOAL #5   Title Patient ambulates with LRAD for safety to reduce fall risk 400' including ramps & curbs modified independent for community mobility. (Target Date: 02/12/2016)   Time 8   Period Weeks   Status New               Plan - 01/13/16 1455    Clinical Impression Statement Pt met STGs # 1and 3. Pt was initally very sore all over and reported after performing HEP during this session that she was less sore.  Discussed fall prevention handout and recommended use of SPC.  Pt verbalizes understanding but seems hesitant to make any changes in home environment or to use AD when recommended.  Rehab Potential Good   PT Frequency 2x / week   PT Duration 8 weeks   PT Treatment/Interventions ADLs/Self Care Home Management;DME Instruction;Gait training;Stair training;Functional mobility training;Therapeutic  activities;Therapeutic exercise;Balance training;Neuromuscular re-education;Patient/family education;Passive range of motion   PT Next Visit Plan Finish checking STGs. [place] gait training with single point cane. LE strengthening and high level balance activities.   Consulted and Agree with Plan of Care Patient      Patient will benefit from skilled therapeutic intervention in order to improve the following deficits and impairments:  Abnormal gait, Decreased activity tolerance, Decreased balance, Decreased endurance, Decreased knowledge of use of DME, Decreased mobility, Decreased strength, Impaired flexibility, Postural dysfunction, Pain  Visit Diagnosis: Muscle weakness (generalized)  Other abnormalities of gait and mobility  Unsteadiness on feet  Repeated falls  Contracture of muscle, multiple sites     Problem List Patient Active Problem List   Diagnosis Date Noted  . Difficulty walking 06/19/2014  . Overactive bladder   . Migraine   . Fibromyalgia   . Chronic fatigue syndrome   . Metatarsal fracture   . Diabetes mellitus   . Ankle fracture   . Osteoarthritis   . IBS (irritable bowel syndrome)   . Depression   . Chronic urethritis   . Degenerative disc disease, lumbar   . Hypercholesteremia   . Prolactin increased (HCC)     , PTA  01/13/16, 3:02 PM [facility] [address] [place], [place], [address] Phone: [phone]   Fax:  [phone]  Name: Linda Lucas MRN: [id number] Date of Birth: 05/14/1945    

## 2016-01-15 ENCOUNTER — Encounter: Payer: Self-pay | Admitting: Physical Therapy

## 2016-01-15 ENCOUNTER — Ambulatory Visit: Payer: Medicare Other | Admitting: Physical Therapy

## 2016-01-15 DIAGNOSIS — R2689 Other abnormalities of gait and mobility: Secondary | ICD-10-CM

## 2016-01-15 DIAGNOSIS — R2681 Unsteadiness on feet: Secondary | ICD-10-CM

## 2016-01-15 DIAGNOSIS — M6281 Muscle weakness (generalized): Secondary | ICD-10-CM | POA: Diagnosis not present

## 2016-01-15 DIAGNOSIS — R296 Repeated falls: Secondary | ICD-10-CM

## 2016-01-15 DIAGNOSIS — M6249 Contracture of muscle, multiple sites: Secondary | ICD-10-CM

## 2016-01-15 NOTE — Therapy (Addendum)
[facility] [address] [place], [place], [address] Phone: [phone]   Fax:  [phone]  Physical Therapy Treatment  Patient Details  Name: Linda Lucas MRN: [id number] Date of Birth: [birth year] Referring Provider: Delrae Rend, MD  Encounter Date: 01/15/2016      PT End of Session - 01/15/16 1500    Visit Number 6   Number of Visits 17   Date for PT Re-Evaluation 02/11/06   Authorization Type Medicare G-Code & Progress note   PT Start Time 4665   PT Stop Time 1404   PT Time Calculation (min) 47 min   Equipment Utilized During Treatment Gait belt   Activity Tolerance Patient tolerated treatment well   Behavior During Therapy WFL for tasks assessed/performed      Past Medical History:  Diagnosis Date  . Ankle fracture    bimeolar fracture  . Chronic urethritis    frequent uti's and or urethritis  . Degenerative disc disease, lumbar 2007   bulging disc  . Depression   . Diabetes mellitus 2004  . Fibromyalgia   . Glaucoma   . Hypercholesteremia   . IBS (irritable bowel syndrome)   . 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 Migraine   . Osteoarthritis   . Overactive bladder   . Prolactin increased ([place]) 05/2002   NORMAL MRI    Past Surgical History:  Procedure Laterality Date  . ABDOMINAL HYSTERECTOMY    . APPENDECTOMY  age 77  . BREAST BIOPSY     x 2  . BURCH PROCEDURE    . CARPECTOMY HAND     LEFT FOR KEINBACH'S DZ  . CATARACT EXTRACTION    . CHOLECYSTECTOMY OPEN     WITH CDE  . CYSTOSCOPY     X 4  . FRACTURE METARSAL    . FUSION OF DISCS     C 3-4 4-5 5-6 VERTBRAES IN NECK DUE TO DEGEN. [place] DZ  . FUSION OF LEFT WRIST WITH PLATE AND SCREWS  9935   CADAVER BONE GRAFT AND APPLICATION OF HUMAN GROWTH FACTORS  .  HEMORROIDECTOMY  age 31  . HYSTEROSCOPY     D&C,HYSTEROSCOPY FOR PED FIBROID   . INTERSTIM IMPLANT PLACEMENT    . ORIF RADIAL HEAD / NECK FRACTURE  2010   RIGHT RADIAL HED EXTENDING INTO JOINT SPACE WITH PLATE AND SCREWS  . RIGHT ANKLE FRACTURE    . ROTATOR CUFF REPAIR    . TONSILLECTOMY AND ADENOIDECTOMY    . TUBAL LIGATION      There were no vitals filed for this visit.      Subjective Assessment - 01/15/16 1322    Subjective Pt reports this has been a very busy week. She has been driving all over for Dr. appointments. Her pain has improved as the weather has warmed up. Her eye dr has cleared her for driving, but she still feels uncomfortable with it.   Limitations Standing;Walking   Patient Stated Goals She would like to never fall again.    Pain Score 3    Pain Location Generalized   Pain Type Chronic pain   Pain Radiating Towards From the waist up           [facility] PT Assessment - 01/15/16 1327      Berg Balance Test  Sit to Stand Able to stand  independently using hands   Standing Unsupported Able to stand safely 2 minutes   Sitting with Back Unsupported but Feet Supported on Floor or Stool Able to sit safely and securely 2 minutes   Stand to Sit Sits safely with minimal use of hands   Transfers Able to transfer safely, definite need of hands   Standing Unsupported with Eyes Closed Able to stand 10 seconds safely   Standing Ubsupported with Feet Together Able to place feet together independently and stand 1 minute safely   From Standing, Reach Forward with Outstretched Arm Can reach forward >5 cm safely (2")  4 inches   From Standing Position, Pick up Object from Floor Able to pick up shoe, needs supervision   From Standing Position, Turn to Look Behind Over each Shoulder Looks behind one side only/other side shows less weight shift  right > left   Turn 360 Degrees Able to turn 360 degrees safely but slowly   Standing Unsupported, Alternately Place Feet on Step/Stool  Able to complete 4 steps without aid or supervision   Standing Unsupported, One Foot in Front Able to take small step independently and hold 30 seconds   Standing on One Leg Tries to lift leg/unable to hold 3 seconds but remains standing independently   Total Score 41           Balance Exercises - 01/15/16 1456      Balance Exercises: Standing   Gait with Head Turns Forward;Limitations     Balance Exercises: Standing   Tandem Gait Limitations Gait with head truns around indoor track 2x; 230 ft; Pt demonstrated a decrease in gait speed with head turns; Min guard for pt safety.           PT Short Term Goals - 01/15/16 1453      PT SHORT TERM GOAL #1   Title Patient demonstrates understanding of initial HEP. (Target Date: 01/15/2016)   Baseline Met, 01/13/16.   Time 4   Period Weeks   Status Achieved     PT SHORT TERM GOAL #2   Title Berg Balance >/= 36/56. (Target Date: 01/15/2016)   Baseline 01/15/16 Met Berg increased to 41   Time 4   Period Weeks   Status Achieved     PT SHORT TERM GOAL #3   Title Patient verbalizes understanding of fall prevention strategies including recommendation of assistive device. (Target Date: 01/15/2016)   Baseline Pt verbalizes understanding but is only willing to use SPC in the community; 01/13/16   Time 4   Period Weeks   Status Achieved     PT SHORT TERM GOAL #4   Title Patient ambulates with LRAD with head turns to scan environment and maintains path with supervision. (Target Date: 01/15/2016)   Baseline Pt required min guard for safety when ambulating with head turns.    Time 4   Period Weeks   Status Not Met           PT Long Term Goals - 12/16/15 1218      PT LONG TERM GOAL #1   Title Patient verbalizes & demonstrates understanding of ongoing HEP / fitness plan. (Target Date: 02/12/2016)   Time 8   Period Weeks   Status New     PT LONG TERM GOAL #2   Title Timed Up & Go without device <13.5sec safely to indicate lower fall  risk. (Target Date: 02/12/2016)   Time 8   Period Weeks  Status New     PT LONG TERM GOAL #3   Title Berg Balance >40/56 to indicate lower fall risk. (Target Date: 02/12/2016)   Time 8   Period Weeks   Status New     PT LONG TERM GOAL #4   Title Dynamic Gait Index >13/24 to indicate lower fall risk. (Target Date: 02/12/2016)   Time 8   Period Weeks   Status New     PT LONG TERM GOAL #5   Title Patient ambulates with LRAD for safety to reduce fall risk 400' including ramps & curbs modified independent for community mobility. (Target Date: 02/12/2016)   Time 8   Period Weeks   Status New           Plan - 01/15/16 1501    Clinical Impression Statement Todays session focused on checking off remaining STGs. Pt has improved berg score moving from a high fall risk catagory to a significant fall risk catagory. She continues to show resistance to using a cane admitting that she does not use is outside of therapy. She would benefit from continued PT to reduce her risk of falls and achieve unmet goals.   Rehab Potential Good   PT Frequency 2x / week   PT Duration 8 weeks   PT Treatment/Interventions ADLs/Self Care Home Management;DME Instruction;Gait training;Stair training;Functional mobility training;Therapeutic activities;Therapeutic exercise;Balance training;Neuromuscular re-education;Patient/family education;Passive range of motion   PT Next Visit Plan Continue with high level balance activities, focus on SLS activities; LE strengthening; Gait training with single point cane on stairs, ramps, and curbs.   Consulted and Agree with Plan of Care Patient      Patient will benefit from skilled therapeutic intervention in order to improve the following deficits and impairments:  Abnormal gait, Decreased activity tolerance, Decreased balance, Decreased endurance, Decreased knowledge of use of DME, Decreased mobility, Decreased strength, Impaired flexibility, Postural dysfunction, Pain  Visit  Diagnosis: Muscle weakness (generalized)  Other abnormalities of gait and mobility  Unsteadiness on feet  Repeated falls  Contracture of muscle, multiple sites     Problem List Patient Active Problem List   Diagnosis Date Noted  . Difficulty walking 06/19/2014  . Overactive bladder   . Migraine   . Fibromyalgia   . Chronic fatigue syndrome   . Metatarsal fracture   . Diabetes mellitus   . Ankle fracture   . Osteoarthritis   . IBS (irritable bowel syndrome)   . Depression   . Chronic urethritis   . Degenerative disc disease, lumbar   . Hypercholesteremia   . Prolactin increased ([place])     Benjiman Core, SPTA 01/15/2016, 3:11 PM  [facility] [address] [place], [place], [address] Phone: [phone]   Fax:  [phone]  Name: Linda Lucas MRN: [id number] Date of Birth: Jul 20, 1945  This note has been reviewed and edited by supervising CI.  This entire session was performed under direct supervision and direction of a licensed therapist/therapist assistant . I have personally read, edited and approve of the note as written.  Willow Ora, PTA, [facility] [address] [place],  [id number] [phone] 01/15/16, 4:06 PM

## 2016-01-19 ENCOUNTER — Encounter: Payer: Self-pay | Admitting: Physical Therapy

## 2016-01-19 ENCOUNTER — Ambulatory Visit: Payer: Medicare Other | Admitting: Physical Therapy

## 2016-01-19 DIAGNOSIS — M6281 Muscle weakness (generalized): Secondary | ICD-10-CM

## 2016-01-19 DIAGNOSIS — R296 Repeated falls: Secondary | ICD-10-CM

## 2016-01-19 DIAGNOSIS — M6249 Contracture of muscle, multiple sites: Secondary | ICD-10-CM

## 2016-01-19 DIAGNOSIS — R2689 Other abnormalities of gait and mobility: Secondary | ICD-10-CM

## 2016-01-19 DIAGNOSIS — R2681 Unsteadiness on feet: Secondary | ICD-10-CM

## 2016-01-19 NOTE — Therapy (Signed)
[facility] [address] [place], [place], [address] Phone: [phone]   Fax:  [phone]  Physical Therapy Treatment  Patient Details  Name: Linda Lucas MRN: [id number] Date of Birth: 20-Jan-1946 Referring Provider: Delrae Rend, MD  Encounter Date: 01/19/2016      PT End of Session - 01/19/16 1420    Visit Number 7   Number of Visits 17   Date for PT Re-Evaluation 02/11/06   Authorization Type Medicare G-Code & Progress note   PT Start Time 1320   PT Stop Time 1400   PT Time Calculation (min) 40 min   Equipment Utilized During Treatment Gait belt   Activity Tolerance Patient limited by pain   Behavior During Therapy [facility] for tasks assessed/performed      Past Medical History:  Diagnosis Date  . Ankle fracture    bimeolar fracture  . Chronic urethritis    frequent uti's and or urethritis  . Degenerative disc disease, lumbar 2007   bulging disc  . Depression   . Diabetes mellitus 2004  . Fibromyalgia   . Glaucoma   . Hypercholesteremia   . IBS (irritable bowel syndrome)   . 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 Migraine   . Osteoarthritis   . Overactive bladder   . Prolactin increased ([place]) 05/2002   NORMAL MRI    Past Surgical History:  Procedure Laterality Date  . ABDOMINAL HYSTERECTOMY    . APPENDECTOMY  age 70  . BREAST BIOPSY     x 2  . BURCH PROCEDURE    . CARPECTOMY HAND     LEFT FOR KEINBACH'S DZ  . CATARACT EXTRACTION    . CHOLECYSTECTOMY OPEN     WITH CDE  . CYSTOSCOPY     X 4  . FRACTURE METARSAL    . FUSION OF DISCS     C 3-4 4-5 5-6 VERTBRAES IN NECK DUE TO DEGEN. [place] DZ  . FUSION OF LEFT WRIST WITH PLATE AND SCREWS  7111   CADAVER BONE GRAFT AND APPLICATION OF HUMAN GROWTH FACTORS  . HEMORROIDECTOMY  age 70  . HYSTEROSCOPY     D&C,HYSTEROSCOPY FOR PED FIBROID   . INTERSTIM IMPLANT PLACEMENT    . ORIF RADIAL HEAD / NECK FRACTURE  2010   RIGHT RADIAL HED EXTENDING INTO JOINT SPACE WITH PLATE AND SCREWS  . RIGHT ANKLE FRACTURE    . ROTATOR CUFF REPAIR    . TONSILLECTOMY AND ADENOIDECTOMY    . TUBAL LIGATION      There were no vitals filed for this visit.      Subjective Assessment - 01/19/16 1324    Subjective Pt reports that she almost fell but caught herself while at church and at home. Both times it was while turning. During activity pt reported onset of R hip pain, rated 5/10.    Currently in Pain? Yes   Pain Score 4    Pain Location Generalized   Pain Type Chronic pain   Pain Radiating Towards from the waist up   Pain Onset More than a month ago   Pain Frequency Constant   Multiple Pain Sites Yes   Pain Score 5   Pain Location Hip   Pain Orientation Right   Pain Descriptors / Indicators Cramping   Pain Type  Acute pain  onset with activity   Pain Onset Other (comment)  intermitant   Pain Frequency Intermittent   Aggravating Factors  Walking long distances; strenuous activity   Pain Relieving Factors Rest, heat, pain killers           [facility] Adult PT Treatment/Exercise - 01/19/16 1415      Exercises   Other Exercises  Performed while seated on 24" stool with feet flat on the ground; no UE support; Posterior leans onto therapist x10; to engage core muscles. Anterior leans with Bil UE on legs x10; to engage erector spinae. Supervision; verbal, tactile, and visual cues for postural control     Knee/Hip Exercises: Stretches   Active Hamstring Stretch Both;2 reps;30 seconds  seated hamstring stretch             Balance Exercises - 01/19/16 1405      Balance Exercises: Standing   Standing Eyes Opened Narrow base of support (BOS);Head turns;Foam/compliant surface;Limitations   Standing Eyes Closed Narrow base of support (BOS);Foam/compliant surface;30  secs;Limitations   Other Standing Exercises Heel and toe walking in parallel bars; Forward and backwards; with Bil UE support due to LE weakness and poor balance; min guard assist; visual and verbal cues for proper technique; pt reported pain in R hip after activity as a 5/10.     Balance Exercises: Standing   Standing Eyes Opened Limitations EO on compliant surface with narrow BOS. Head truns; 3x 30 seconds; Head nods 2x 30 seconds; activity aborted due to increase in hip pain; min guard for all EO activities   Standing Eyes Closed Limitations EC static standing on compliant surface; narrow BOS; intermitant UE support to maintain balance; min guard assist; 3x 30 seconds           PT Short Term Goals - 01/15/16 1453      PT SHORT TERM GOAL #1   Title Patient demonstrates understanding of initial HEP. (Target Date: 01/15/2016)   Baseline Met, 01/13/16.   Time 4   Period Weeks   Status Achieved     PT SHORT TERM GOAL #2   Title Berg Balance >/= 36/56. (Target Date: 01/15/2016)   Baseline 01/15/16 Met Berg increased to 41   Time 4   Period Weeks   Status Achieved     PT SHORT TERM GOAL #3   Title Patient verbalizes understanding of fall prevention strategies including recommendation of assistive device. (Target Date: 01/15/2016)   Baseline Pt verbalizes understanding but is only willing to use SPC in the community; 01/13/16   Time 4   Period Weeks   Status Achieved     PT SHORT TERM GOAL #4   Title Patient ambulates with LRAD with head turns to scan environment and maintains path with supervision. (Target Date: 01/15/2016)   Baseline Pt required min guard for safety when ambulating with head turns.    Time 4   Period Weeks   Status Not Met           PT Long Term Goals - 12/16/15 1218      PT LONG TERM GOAL #1   Title Patient verbalizes & demonstrates understanding of ongoing HEP / fitness plan. (Target Date: 02/12/2016)   Time 8   Period Weeks   Status New     PT LONG  TERM GOAL #2   Title Timed Up & Go without device <13.5sec safely to indicate lower fall risk. (Target Date: 02/12/2016)   Time 8   Period Weeks  Status New     PT LONG TERM GOAL #3   Title Berg Balance >40/56 to indicate lower fall risk. (Target Date: 02/12/2016)   Time 8   Period Weeks   Status New     PT LONG TERM GOAL #4   Title Dynamic Gait Index >13/24 to indicate lower fall risk. (Target Date: 02/12/2016)   Time 8   Period Weeks   Status New     PT LONG TERM GOAL #5   Title Patient ambulates with LRAD for safety to reduce fall risk 400' including ramps & curbs modified independent for community mobility. (Target Date: 02/12/2016)   Time 8   Period Weeks   Status New           Plan - 01/19/16 1421    Clinical Impression Statement Todays session was to focus on high level balance activities, but pt was limited due to onset of hip pain. Pt reports this pain comes on when she has walking long distances or is doing strenuous exercise. Pain subsided after performing seated activities and static standing balance exercises, but flared up again during static standing with head nods, due to need for increased hip stratagy for balance. Pt should benefit from continued pt to address balance and gait deviations.     Rehab Potential Good   PT Frequency 2x / week   PT Duration 8 weeks   PT Treatment/Interventions ADLs/Self Care Home Management;DME Instruction;Gait training;Stair training;Functional mobility training;Therapeutic activities;Therapeutic exercise;Balance training;Neuromuscular re-education;Patient/family education;Passive range of motion   PT Next Visit Plan Check in with pt's hip pain. If still present focus on stretching and strengthening at the R hip. If improved continue with high level balance activities with focus on SLS; gait training and LE strengthening.   Consulted and Agree with Plan of Care Patient      Patient will benefit from skilled therapeutic intervention  in order to improve the following deficits and impairments:  Abnormal gait, Decreased activity tolerance, Decreased balance, Decreased endurance, Decreased knowledge of use of DME, Decreased mobility, Decreased strength, Impaired flexibility, Postural dysfunction, Pain  Visit Diagnosis: Muscle weakness (generalized)  Other abnormalities of gait and mobility  Unsteadiness on feet  Repeated falls  Contracture of muscle, multiple sites     Problem List Patient Active Problem List   Diagnosis Date Noted  . Difficulty walking 06/19/2014  . Overactive bladder   . Migraine   . Fibromyalgia   . Chronic fatigue syndrome   . Metatarsal fracture   . Diabetes mellitus   . Ankle fracture   . Osteoarthritis   . IBS (irritable bowel syndrome)   . Depression   . Chronic urethritis   . Degenerative disc disease, lumbar   . Hypercholesteremia   . Prolactin increased ([place])     Benjiman Core, SPTA 01/19/2016, 2:36 PM  [facility] [address] [place], [place], [address] Phone: [phone]   Fax:  [phone]  Name: KAMALI SAKATA MRN: [id number] Date of Birth: 1945/07/01

## 2016-01-20 ENCOUNTER — Ambulatory Visit: Payer: Medicare Other | Admitting: Physical Therapy

## 2016-01-20 ENCOUNTER — Encounter: Payer: Self-pay | Admitting: Physical Therapy

## 2016-01-20 DIAGNOSIS — M6281 Muscle weakness (generalized): Secondary | ICD-10-CM

## 2016-01-20 DIAGNOSIS — R296 Repeated falls: Secondary | ICD-10-CM

## 2016-01-20 DIAGNOSIS — R2681 Unsteadiness on feet: Secondary | ICD-10-CM

## 2016-01-20 DIAGNOSIS — M6249 Contracture of muscle, multiple sites: Secondary | ICD-10-CM

## 2016-01-20 DIAGNOSIS — R2689 Other abnormalities of gait and mobility: Secondary | ICD-10-CM

## 2016-01-20 NOTE — Therapy (Signed)
[facility] [address] [place], [place], [address] Phone: [phone]   Fax:  [phone]  Physical Therapy Treatment  Patient Details  Name: Linda Lucas MRN: [id number] Date of Birth: 1946/02/05 Referring Provider: Delrae Rend, MD  Encounter Date: 01/20/2016      PT End of Session - 01/20/16 1452    Visit Number 8   Number of Visits 17   Date for PT Re-Evaluation 02/11/06   Authorization Type Medicare G-Code & Progress note   PT Start Time 1102   PT Stop Time 1145   PT Time Calculation (min) 43 min   Equipment Utilized During Treatment Gait belt   Activity Tolerance Patient tolerated treatment well   Behavior During Therapy [facility]FL for tasks assessed/performed      Past Medical History:  Diagnosis Date  . Ankle fracture    bimeolar fracture  . Chronic urethritis    frequent uti's and or urethritis  . Degenerative disc disease, lumbar 2007   bulging disc  . Depression   . Diabetes mellitus 2004  . Fibromyalgia   . Glaucoma   . Hypercholesteremia   . IBS (irritable bowel syndrome)   . 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 Migraine   . Osteoarthritis   . Overactive bladder   . Prolactin increased ([place]) 05/2002   NORMAL MRI    Past Surgical History:  Procedure Laterality Date  . ABDOMINAL HYSTERECTOMY    . APPENDECTOMY  age 89  . BREAST BIOPSY     x 2  . BURCH PROCEDURE    . CARPECTOMY HAND     LEFT FOR KEINBACH'S DZ  . CATARACT EXTRACTION    . CHOLECYSTECTOMY OPEN     WITH CDE  . CYSTOSCOPY     X 4  . FRACTURE METARSAL    . FUSION OF DISCS     C 3-4 4-5 5-6 VERTBRAES IN NECK DUE TO DEGEN. [place] DZ  . FUSION OF LEFT WRIST WITH PLATE AND SCREWS  5397   CADAVER BONE GRAFT AND APPLICATION OF HUMAN GROWTH FACTORS  .  HEMORROIDECTOMY  age 61  . HYSTEROSCOPY     D&C,HYSTEROSCOPY FOR PED FIBROID   . INTERSTIM IMPLANT PLACEMENT    . ORIF RADIAL HEAD / NECK FRACTURE  2010   RIGHT RADIAL HED EXTENDING INTO JOINT SPACE WITH PLATE AND SCREWS  . RIGHT ANKLE FRACTURE    . ROTATOR CUFF REPAIR    . TONSILLECTOMY AND ADENOIDECTOMY    . TUBAL LIGATION      There were no vitals filed for this visit.      Subjective Assessment - 01/20/16 1105    Subjective Patient reports that she went home yesterday and took 2 advil and applied heat to her hip. It helped the pain but is still seems sensitive today.   Patient is accompained by: Family member   Limitations Standing;Walking   Patient Stated Goals She would like to never fall again.    Currently in Pain? Yes   Pain Score 2    Pain Location Generalized   Pain Type Chronic pain   Pain Radiating Towards from the waist up   Pain Onset More than a month ago   Pain Frequency Constant   Multiple Pain Sites Yes   Pain Score 4  Pain Location Hip   Pain Orientation Right   Pain Descriptors / Indicators Aching;Nagging   Pain Type Chronic pain   Pain Onset Other (comment)  intermitant   Pain Frequency Intermittent           [facility] Adult PT Treatment/Exercise - 01/20/16 1116      Knee/Hip Exercises: Stretches   Active Hamstring Stretch Both;3 reps;30 seconds   Active Hamstring Stretch Limitations Seated; VC for proper technique and body mechanics   Quad Stretch Both;3 reps;30 seconds   Quad Stretch Limitations supine; thomas stretch; verbal and tactile cues for proper technique     Knee/Hip Exercises: Aerobic   Other Aerobic Scifit; 5 minuets; level 1  to increase muscle extensibility before stretching           Balance Exercises - 01/20/16 1447      Balance Exercises: Seated   Other Seated Exercises Seated on small blue pball; Static sitting EC 3x 30 seconds. EO seated marches; 20 reps x3; min assist for balance; intermittant UE support; VC for  proper technique.           PT Short Term Goals - 01/15/16 1453      PT SHORT TERM GOAL #1   Title Patient demonstrates understanding of initial HEP. (Target Date: 01/15/2016)   Baseline Met, 01/13/16.   Time 4   Period Weeks   Status Achieved     PT SHORT TERM GOAL #2   Title Berg Balance >/= 36/56. (Target Date: 01/15/2016)   Baseline 01/15/16 Met Berg increased to 41   Time 4   Period Weeks   Status Achieved     PT SHORT TERM GOAL #3   Title Patient verbalizes understanding of fall prevention strategies including recommendation of assistive device. (Target Date: 01/15/2016)   Baseline Pt verbalizes understanding but is only willing to use SPC in the community; 01/13/16   Time 4   Period Weeks   Status Achieved     PT SHORT TERM GOAL #4   Title Patient ambulates with LRAD with head turns to scan environment and maintains path with supervision. (Target Date: 01/15/2016)   Baseline Pt required min guard for safety when ambulating with head turns.    Time 4   Period Weeks   Status Not Met           PT Long Term Goals - 12/16/15 1218      PT LONG TERM GOAL #1   Title Patient verbalizes & demonstrates understanding of ongoing HEP / fitness plan. (Target Date: 02/12/2016)   Time 8   Period Weeks   Status New     PT LONG TERM GOAL #2   Title Timed Up & Go without device <13.5sec safely to indicate lower fall risk. (Target Date: 02/12/2016)   Time 8   Period Weeks   Status New     PT LONG TERM GOAL #3   Title Berg Balance >40/56 to indicate lower fall risk. (Target Date: 02/12/2016)   Time 8   Period Weeks   Status New     PT LONG TERM GOAL #4   Title Dynamic Gait Index >13/24 to indicate lower fall risk. (Target Date: 02/12/2016)   Time 8   Period Weeks   Status New     PT LONG TERM GOAL #5   Title Patient ambulates with LRAD for safety to reduce fall risk 400' including ramps & curbs modified independent for community mobility. (Target Date: 02/12/2016)   Time 8  Period Weeks   Status New           Plan - 01/20/16 1453    Clinical Impression Statement Todays session focused on stretching and strengthening at the R hip joint due to an increase in pain affecting pt's ability to participate in high level balance activities. Seated balance activities were performed to avoid excessive stress at the hip. Patient tolerated treatment well with no report of increased pain at end of session. Patient is progressing towards goals and should benefit from continued PT to achieve unmet goals.    Rehab Potential Good   PT Frequency 2x / week   PT Duration 8 weeks   PT Treatment/Interventions ADLs/Self Care Home Management;DME Instruction;Gait training;Stair training;Functional mobility training;Therapeutic activities;Therapeutic exercise;Balance training;Neuromuscular re-education;Patient/family education;Passive range of motion   PT Next Visit Plan Avoid excessive stress at the R hip. Stretch and strengthen R hip; High level balance activities with focus on SLS if pt's hip will allow; gait training with single tip cane.   Consulted and Agree with Plan of Care Patient      Patient will benefit from skilled therapeutic intervention in order to improve the following deficits and impairments:  Abnormal gait, Decreased activity tolerance, Decreased balance, Decreased endurance, Decreased knowledge of use of DME, Decreased mobility, Decreased strength, Impaired flexibility, Postural dysfunction, Pain  Visit Diagnosis: Muscle weakness (generalized)  Other abnormalities of gait and mobility  Unsteadiness on feet  Repeated falls  Contracture of muscle, multiple sites     Problem List Patient Active Problem List   Diagnosis Date Noted  . Difficulty walking 06/19/2014  . Overactive bladder   . Migraine   . Fibromyalgia   . Chronic fatigue syndrome   . Metatarsal fracture   . Diabetes mellitus   . Ankle fracture   . Osteoarthritis   . IBS (irritable  bowel syndrome)   . Depression   . Chronic urethritis   . Degenerative disc disease, lumbar   . Hypercholesteremia   . Prolactin increased ([place])     Benjiman Core 01/20/2016, 3:08 PM  [facility] [address] [place], [place], [address] Phone: [phone]   Fax:  [phone]  Name: HAIVEN NARDONE MRN: [id number] Date of Birth: Dec 26, 1945

## 2016-01-21 ENCOUNTER — Ambulatory Visit: Payer: Medicare Other | Admitting: Physical Therapy

## 2016-01-25 ENCOUNTER — Other Ambulatory Visit: Payer: Self-pay | Admitting: Internal Medicine

## 2016-01-25 DIAGNOSIS — N9489 Other specified conditions associated with female genital organs and menstrual cycle: Secondary | ICD-10-CM

## 2016-01-25 DIAGNOSIS — R634 Abnormal weight loss: Secondary | ICD-10-CM

## 2016-01-25 DIAGNOSIS — R109 Unspecified abdominal pain: Secondary | ICD-10-CM

## 2016-01-26 ENCOUNTER — Encounter: Payer: Self-pay | Admitting: Physical Therapy

## 2016-01-26 ENCOUNTER — Ambulatory Visit: Payer: Medicare Other | Admitting: Physical Therapy

## 2016-01-26 DIAGNOSIS — M6281 Muscle weakness (generalized): Secondary | ICD-10-CM | POA: Diagnosis not present

## 2016-01-26 DIAGNOSIS — R2681 Unsteadiness on feet: Secondary | ICD-10-CM

## 2016-01-26 DIAGNOSIS — M6249 Contracture of muscle, multiple sites: Secondary | ICD-10-CM

## 2016-01-26 DIAGNOSIS — R2689 Other abnormalities of gait and mobility: Secondary | ICD-10-CM

## 2016-01-26 NOTE — Patient Instructions (Addendum)
Piriformis Stretch - Supine    Pull uninvolved knee across body toward opposite shoulder. Hold slight stretch for _30__ seconds. Repeat with involved leg. Repeat _2-3__ times. Do _1-2__ times per day.  Copyright  VHI. All rights reserved.  Piriformis Stretch    Lying on back, pull right knee toward opposite shoulder. Hold _30___ seconds. Repeat _2-3___ times. Do ___1-2_ sessions per day.  http://[email or web address].us/258   Copyright  VHI. All rights reserved.  Piriformis Stretch, Sitting    Sit, one ankle on opposite knee, same-side hand on crossed knee. Push down on knee, keeping spine straight. Lean torso forward, with flat back, until tension is felt in hamstrings and gluteals of crossed-leg side. Hold __30_ seconds.  Repeat _2-3__ times per session. Do _1-2__ sessions per day.  Copyright  VHI. All rights reserved.  Iliotibial Band Stretch    Stand with right hip _4-6___ inches from wall. Cross other leg in front and use it and arm for support. Lean toward wall until a stretch is felt on outside of hip near wall. Keep that leg straight. Hold __30__ seconds. Repeat __2-3__ times. Do _1-2___ sessions per day.  [email or web address]://[email or web address].[email or web address]   Copyright  VHI. All rights reserved.

## 2016-01-27 NOTE — Therapy (Signed)
[facility] [address] [place], [place], [address] Phone: [phone]   Fax:  [phone]  Physical Therapy Treatment  Patient Details  Name: Linda Lucas MRN: [id number] Date of Birth: 07/24/1945 Referring Provider: Delrae Rend, MD  Encounter Date: 01/26/2016      PT End of Session - 01/26/16 1800    Visit Number 9   Number of Visits 17   Date for PT Re-Evaluation 02/11/06   Authorization Type Medicare G-Code & Progress note   PT Start Time 1400   PT Stop Time 1444   PT Time Calculation (min) 44 min   Equipment Utilized During Treatment Gait belt   Activity Tolerance Patient tolerated treatment well   Behavior During Therapy [facility] for tasks assessed/performed      Past Medical History:  Diagnosis Date  . Ankle fracture    bimeolar fracture  . Chronic urethritis    frequent uti's and or urethritis  . Degenerative disc disease, lumbar 2007   bulging disc  . Depression   . Diabetes mellitus 2004  . Fibromyalgia   . Glaucoma   . Hypercholesteremia   . IBS (irritable bowel syndrome)   . 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 Migraine   . Osteoarthritis   . Overactive bladder   . Prolactin increased ([place]) 05/2002   NORMAL MRI    Past Surgical History:  Procedure Laterality Date  . ABDOMINAL HYSTERECTOMY    . APPENDECTOMY  age 59  . BREAST BIOPSY     x 2  . BURCH PROCEDURE    . CARPECTOMY HAND     LEFT FOR KEINBACH'S DZ  . CATARACT EXTRACTION    . CHOLECYSTECTOMY OPEN     WITH CDE  . CYSTOSCOPY     X 4  . FRACTURE METARSAL    . FUSION OF DISCS     C 3-4 4-5 5-6 VERTBRAES IN NECK DUE TO DEGEN. [place] DZ  . FUSION OF LEFT WRIST WITH PLATE AND SCREWS  3704   CADAVER BONE GRAFT AND APPLICATION OF HUMAN GROWTH FACTORS  .  HEMORROIDECTOMY  age 77  . HYSTEROSCOPY     D&C,HYSTEROSCOPY FOR PED FIBROID   . INTERSTIM IMPLANT PLACEMENT    . ORIF RADIAL HEAD / NECK FRACTURE  2010   RIGHT RADIAL HED EXTENDING INTO JOINT SPACE WITH PLATE AND SCREWS  . RIGHT ANKLE FRACTURE    . ROTATOR CUFF REPAIR    . TONSILLECTOMY AND ADENOIDECTOMY    . TUBAL LIGATION      There were no vitals filed for this visit.      Subjective Assessment - 01/26/16 1400    Subjective No falls. She reports using cane to go to church & PT. She holds her husband when going to restaurants. In her home she walks without device with occasional touching of furniture & walls.    Patient is accompained by: Family member   Limitations Standing;Walking   Patient Stated Goals She would like to never fall again.    Currently in Pain? Yes   Pain Location Hip   Pain Orientation Right   Pain Descriptors / Indicators Aching   Pain Type Chronic pain   Pain Onset More than a month ago   Pain Frequency Constant   Aggravating Factors  activities,  Pain Relieving Factors advil, heat pad   Effect of Pain on Daily Activities limits standing   Multiple Pain Sites No                         [facility] Adult PT Treatment/Exercise - 01/26/16 1400      Ambulation/Gait   Stairs Yes   Stairs Assistance 5: Supervision   Stairs Assistance Details (indicate cue type and reason) PT demo & instructed proper technique; alternate lead LE to faciliatate ability to raise & lower body weight with either limb as needed with function.   Stair Management Technique Two rails;Step to pattern;Forwards  alternating lead LE   Number of Stairs 4  2 reps             Balance Exercises - 01/26/16 1400      Balance Exercises: Standing   Standing Eyes Opened Head turns;Wide (BOA);5 reps  standing crossways on 2" firm foam beam   Standing Eyes Closed Head turns;Wide (BOA);Solid surface;5 reps  lateral, up/down & diagonals   Standing, One Foot on a Step  Eyes open;4 inch;30 secs  one foot in lower counter   Stepping Strategy Anterior;Posterior;5 reps  alternate stepping on/off 2" beam & hold 5 sec   Step Ups Forward;Lateral;4 inch;Intermittent UE support  PT demo, tactile, verbal cues on proper technique           PT Education - 01/26/16 1400    Education provided Yes   Education Details Piriformis & ITB stretches for right hip, standing with one leg in lower counter to facilitate SLS   Person(s) Educated Patient   Methods Explanation;Demonstration;Verbal cues;Handout   Comprehension Verbalized understanding;Returned demonstration;Verbal cues required;Need further instruction          PT Short Term Goals - 01/15/16 1453      PT SHORT TERM GOAL #1   Title Patient demonstrates understanding of initial HEP. (Target Date: 01/15/2016)   Baseline Met, 01/13/16.   Time 4   Period Weeks   Status Achieved     PT SHORT TERM GOAL #2   Title Berg Balance >/= 36/56. (Target Date: 01/15/2016)   Baseline 01/15/16 Met Berg increased to 41   Time 4   Period Weeks   Status Achieved     PT SHORT TERM GOAL #3   Title Patient verbalizes understanding of fall prevention strategies including recommendation of assistive device. (Target Date: 01/15/2016)   Baseline Pt verbalizes understanding but is only willing to use SPC in the community; 01/13/16   Time 4   Period Weeks   Status Achieved     PT SHORT TERM GOAL #4   Title Patient ambulates with LRAD with head turns to scan environment and maintains path with supervision. (Target Date: 01/15/2016)   Baseline Pt required min guard for safety when ambulating with head turns.    Time 4   Period Weeks   Status Not Met           PT Long Term Goals - 12/16/15 1218      PT LONG TERM GOAL #1   Title Patient verbalizes & demonstrates understanding of ongoing HEP / fitness plan. (Target Date: 02/12/2016)   Time 8   Period Weeks   Status New     PT LONG TERM GOAL #2   Title Timed Up & Go  without device <13.5sec safely to indicate lower fall risk. (Target Date: 02/12/2016)   Time 8   Period Weeks  Status New     PT LONG TERM GOAL #3   Title Berg Balance >40/56 to indicate lower fall risk. (Target Date: 02/12/2016)   Time 8   Period Weeks   Status New     PT LONG TERM GOAL #4   Title Dynamic Gait Index >13/24 to indicate lower fall risk. (Target Date: 02/12/2016)   Time 8   Period Weeks   Status New     PT LONG TERM GOAL #5   Title Patient ambulates with LRAD for safety to reduce fall risk 400' including ramps & curbs modified independent for community mobility. (Target Date: 02/12/2016)   Time 8   Period Weeks   Status New               Plan - 01/26/16 1800    Clinical Impression Statement Patient improved ability to raise & lower body weight on 4" block & stairs with rails with skilled instruction & repetition in proper technique. Patient was able to perform hip strategy activities on firmer foam 2" beam with tactile cues and lower repetitions.    Rehab Potential Good   PT Frequency 2x / week   PT Duration 8 weeks   PT Treatment/Interventions ADLs/Self Care Home Management;DME Instruction;Gait training;Stair training;Functional mobility training;Therapeutic activities;Therapeutic exercise;Balance training;Neuromuscular re-education;Patient/family education;Passive range of motion   PT Next Visit Plan G-Code with Berg Balance, Stretch and strengthen R hip; High level balance activities; gait training household without device & community with cane   Consulted and Agree with Plan of Care Patient      Patient will benefit from skilled therapeutic intervention in order to improve the following deficits and impairments:  Abnormal gait, Decreased activity tolerance, Decreased balance, Decreased endurance, Decreased knowledge of use of DME, Decreased mobility, Decreased strength, Impaired flexibility, Postural dysfunction, Pain  Visit Diagnosis: Muscle weakness  (generalized)  Other abnormalities of gait and mobility  Unsteadiness on feet  Contracture of muscle, multiple sites     Problem List Patient Active Problem List   Diagnosis Date Noted  . Difficulty walking 06/19/2014  . Overactive bladder   . Migraine   . Fibromyalgia   . Chronic fatigue syndrome   . Metatarsal fracture   . Diabetes mellitus   . Ankle fracture   . Osteoarthritis   . IBS (irritable bowel syndrome)   . Depression   . Chronic urethritis   . Degenerative disc disease, lumbar   . Hypercholesteremia   . Prolactin increased ([place])     Kelie Gainey PT, DPT 01/27/2016, 6:46 AM  [facility] [address] [place], [place], [address] Phone: [phone]   Fax:  [phone]  Name: Linda Lucas MRN: [id number] Date of Birth: 1945-11-18

## 2016-01-28 ENCOUNTER — Ambulatory Visit: Payer: Medicare Other | Admitting: Physical Therapy

## 2016-01-28 ENCOUNTER — Encounter: Payer: Self-pay | Admitting: Physical Therapy

## 2016-01-28 DIAGNOSIS — R2681 Unsteadiness on feet: Secondary | ICD-10-CM

## 2016-01-28 DIAGNOSIS — M6281 Muscle weakness (generalized): Secondary | ICD-10-CM | POA: Diagnosis not present

## 2016-01-28 DIAGNOSIS — R2689 Other abnormalities of gait and mobility: Secondary | ICD-10-CM

## 2016-01-28 DIAGNOSIS — M6249 Contracture of muscle, multiple sites: Secondary | ICD-10-CM

## 2016-01-28 NOTE — Patient Instructions (Signed)
Piriformis Stretch - Supine    Pull uninvolved knee across body toward opposite shoulder. Hold slight stretch for _30__ seconds. Repeat with involved leg. Repeat _2-3__ times. Do _1-2__ times per day.  Copyright  VHI. All rights reserved.  Piriformis Stretch    Lying on back, pull right knee toward opposite shoulder. Hold _30___ seconds. Repeat _2-3___ times. Do ___1-2_ sessions per day.  http://gt2.exer.us/258   Copyright  VHI. All rights reserved.  Piriformis Stretch, Sitting    Sit, one ankle on opposite knee, same-side hand on crossed knee. Push down on knee, keeping spine straight. Lean torso forward, with flat back, until tension is felt in hamstrings and gluteals of crossed-leg side. Hold __30_ seconds.  Repeat _2-3__ times per session. Do _1-2__ sessions per day.  Copyright  VHI. All rights reserved.  Iliotibial Band Stretch    Stand with right hip _4-6___ inches from wall. Cross other leg in front and use it and arm for support. Lean toward wall until a stretch is felt on outside of hip near wall. Keep that leg straight. Hold __30__ seconds. Repeat __2-3__ times. Do _1-2___ sessions per day.  http://gt2.exer.us/355   Copyright  VHI. All rights reserved.   

## 2016-01-29 NOTE — Therapy (Signed)
[facility] [address] [place], [place], [address] Phone: [phone]   Fax:  [phone]  Physical Therapy Treatment  Patient Details  Name: Linda Lucas MRN: [id number] Date of Birth: 16-Nov-1945 Referring Provider: Delrae Rend, MD  Encounter Date: 01/28/2016      PT End of Session - 01/28/16 1348    Visit Number 10   Number of Visits 17   Date for PT Re-Evaluation 02/11/06   Authorization Type Medicare G-Code & Progress note   PT Start Time 1400   PT Stop Time 1446   PT Time Calculation (min) 46 min   Equipment Utilized During Treatment Gait belt   Activity Tolerance Patient tolerated treatment well   Behavior During Therapy [facility] for tasks assessed/performed      Past Medical History:  Diagnosis Date  . Ankle fracture    bimeolar fracture  . Chronic urethritis    frequent uti's and or urethritis  . Degenerative disc disease, lumbar 2007   bulging disc  . Depression   . Diabetes mellitus 2004  . Fibromyalgia   . Glaucoma   . Hypercholesteremia   . IBS (irritable bowel syndrome)   . 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 Migraine   . Osteoarthritis   . Overactive bladder   . Prolactin increased ([place]) 05/2002   NORMAL MRI    Past Surgical History:  Procedure Laterality Date  . ABDOMINAL HYSTERECTOMY    . APPENDECTOMY  age 39  . BREAST BIOPSY     x 2  . BURCH PROCEDURE    . CARPECTOMY HAND     LEFT FOR KEINBACH'S DZ  . CATARACT EXTRACTION    . CHOLECYSTECTOMY OPEN     WITH CDE  . CYSTOSCOPY     X 4  . FRACTURE METARSAL    . FUSION OF DISCS     C 3-4 4-5 5-6 VERTBRAES IN NECK DUE TO DEGEN. [place] DZ  . FUSION OF LEFT WRIST WITH PLATE AND SCREWS  3291   CADAVER BONE GRAFT AND APPLICATION OF HUMAN GROWTH FACTORS  .  HEMORROIDECTOMY  age 75  . HYSTEROSCOPY     D&C,HYSTEROSCOPY FOR PED FIBROID   . INTERSTIM IMPLANT PLACEMENT    . ORIF RADIAL HEAD / NECK FRACTURE  2010   RIGHT RADIAL HED EXTENDING INTO JOINT SPACE WITH PLATE AND SCREWS  . RIGHT ANKLE FRACTURE    . ROTATOR CUFF REPAIR    . TONSILLECTOMY AND ADENOIDECTOMY    . TUBAL LIGATION      There were no vitals filed for this visit.      Subjective Assessment - 01/28/16 1402    Subjective Her back started hurting first thing this morning. Denies any activities that seem to have set it off.    Limitations Standing;Walking   Patient Stated Goals She would like to never fall again.    Currently in Pain? Yes   Pain Score 5    Pain Location Back   Pain Orientation Right;Lower   Pain Descriptors / Indicators Aching   Pain Type Chronic pain   Pain Onset More than a month ago   Pain Frequency Constant   Aggravating Factors  unknown   Pain Relieving Factors medications   Multiple Pain Sites No  [facility] PT Assessment - 01/28/16 1400      Berg Balance Test   Sit to Stand Able to stand without using hands and stabilize independently   Standing Unsupported Able to stand safely 2 minutes   Sitting with Back Unsupported but Feet Supported on Floor or Stool Able to sit safely and securely 2 minutes   Stand to Sit Sits safely with minimal use of hands   Transfers Able to transfer safely, minor use of hands   Standing Unsupported with Eyes Closed Able to stand 10 seconds safely   Standing Ubsupported with Feet Together Able to place feet together independently and stand 1 minute safely   From Standing, Reach Forward with Outstretched Arm Can reach forward >12 cm safely (5")   From Standing Position, Pick up Object from Floor Able to pick up shoe, needs supervision   From Standing Position, Turn to Look Behind Over each Shoulder Looks behind one side only/other side shows less weight shift   Turn 360 Degrees Able to turn 360 degrees  safely but slowly   Standing Unsupported, Alternately Place Feet on Step/Stool Able to complete 4 steps without aid or supervision   Standing Unsupported, One Foot in Front Able to take small step independently and hold 30 seconds   Standing on One Leg Tries to lift leg/unable to hold 3 seconds but remains standing independently   Total Score 44   Berg comment: Initial was 32/56                          Balance Exercises - 01/28/16 1400      Balance Exercises: Standing   Standing Eyes Opened Head turns;Wide (BOA);5 reps  standing crossways on 2" firm foam beam   Standing Eyes Closed Head turns;Wide (BOA);Solid surface;5 reps  lateral, up/down & diagonals   Standing, One Foot on a Step Eyes open;4 inch;30 secs  one foot in lower counter   Stepping Strategy Anterior;Posterior;5 reps  alternate stepping on/off 2" beam & hold 5 sec   Step Ups Forward;Lateral;4 inch;Intermittent UE support  PT demo, tactile, verbal cues on proper technique   Turning Right;Left;10 reps  used colored tiles as visual cues & verbal cues   Heel Raises Limitations off 2" beam for increased ROM required UE support   Toe Raise Limitations off 2" beam for increased ROM required UE support             PT Short Term Goals - 01/15/16 1453      PT SHORT TERM GOAL #1   Title Patient demonstrates understanding of initial HEP. (Target Date: 01/15/2016)   Baseline Met, 01/13/16.   Time 4   Period Weeks   Status Achieved     PT SHORT TERM GOAL #2   Title Berg Balance >/= 36/56. (Target Date: 01/15/2016)   Baseline 01/15/16 Met Berg increased to 41   Time 4   Period Weeks   Status Achieved     PT SHORT TERM GOAL #3   Title Patient verbalizes understanding of fall prevention strategies including recommendation of assistive device. (Target Date: 01/15/2016)   Baseline Pt verbalizes understanding but is only willing to use SPC in the community; 01/13/16   Time 4   Period Weeks   Status  Achieved     PT SHORT TERM GOAL #4   Title Patient ambulates with LRAD with head turns to scan environment and maintains path with supervision. (Target Date: 01/15/2016)   Baseline  Pt required min guard for safety when ambulating with head turns.    Time 4   Period Weeks   Status Not Met           PT Long Term Goals - 02-25-2016 1352      PT LONG TERM GOAL #1   Title Patient verbalizes & demonstrates understanding of ongoing HEP / fitness plan. (Target Date: 02/12/2016)   Time 8   Period Weeks   Status On-going     PT LONG TERM GOAL #2   Title Timed Up & Go without device <13.5sec safely to indicate lower fall risk. (Target Date: 02/12/2016)   Time 8   Period Weeks   Status On-going     PT LONG TERM GOAL #3   Title Berg Balance >48/56 to indicate lower fall risk. (Target Date: 02/12/2016)   Time 8   Period Weeks   Status Revised     PT LONG TERM GOAL #4   Title Dynamic Gait Index >13/24 to indicate lower fall risk. (Target Date: 02/12/2016)   Time 8   Period Weeks   Status On-going     PT LONG TERM GOAL #5   Title Patient ambulates with LRAD for safety to reduce fall risk 400' including ramps & curbs modified independent for community mobility. (Target Date: 02/12/2016)   Time 8   Period Weeks   Status On-going               Plan - 2016/02/25 1352    Clinical Impression Statement Patient improved Berg Balance from 32/56 to 44/56 indicating lower fall risk. She reported less back pain after session with movements that when she arrived.    Rehab Potential Good   PT Frequency 2x / week   PT Duration 8 weeks   PT Treatment/Interventions ADLs/Self Care Home Management;DME Instruction;Gait training;Stair training;Functional mobility training;Therapeutic activities;Therapeutic exercise;Balance training;Neuromuscular re-education;Patient/family education;Passive range of motion   PT Next Visit Plan Stretch and strengthen R hip; High level balance activities; gait training  household without device & community with cane   Consulted and Agree with Plan of Care Patient      Patient will benefit from skilled therapeutic intervention in order to improve the following deficits and impairments:  Abnormal gait, Decreased activity tolerance, Decreased balance, Decreased endurance, Decreased knowledge of use of DME, Decreased mobility, Decreased strength, Impaired flexibility, Postural dysfunction, Pain  Visit Diagnosis: Muscle weakness (generalized)  Other abnormalities of gait and mobility  Unsteadiness on feet  Contracture of muscle, multiple sites       G-Codes - 02/25/16 1354    Functional Assessment Tool Used Berg Balance 44/56   Functional Limitation Mobility: Walking and moving around   Mobility: Walking and Moving Around Current Status [phone]) At least 40 percent but less than 60 percent impaired, limited or restricted   Mobility: Walking and Moving Around Goal Status [phone]) At least 20 percent but less than 40 percent impaired, limited or restricted      Problem List Patient Active Problem List   Diagnosis Date Noted  . Difficulty walking 06/19/2014  . Overactive bladder   . Migraine   . Fibromyalgia   . Chronic fatigue syndrome   . Metatarsal fracture   . Diabetes mellitus   . Ankle fracture   . Osteoarthritis   . IBS (irritable bowel syndrome)   . Depression   . Chronic urethritis   . Degenerative disc disease, lumbar   . Hypercholesteremia   . Prolactin increased ([place])  Physical Therapy Progress Note  Dates of Reporting Period: 12/15/2015 to 12/28/2015  Objective Reports of Subjective Statement: Patient reports improved balance & mobility with PT    Objective Measurements: see above  Goal Update: see above  Plan: continue established plan  Reason Skilled Services are Required: Patient continues to have high fall risk with skilled care needed to further reduce risk.     Jamey Reas PT, DPT 01/29/2016, 1:55 PM  [facility] [address] [place], [place], [address] Phone: [phone]   Fax:  [phone]  Name: Linda Lucas MRN: [id number] Date of Birth: 1945/10/05

## 2016-02-01 ENCOUNTER — Ambulatory Visit
Admission: RE | Admit: 2016-02-01 | Discharge: 2016-02-01 | Disposition: A | Discharge status: Home / Self Care | Payer: Medicare Other | Source: Ambulatory Visit | Attending: Internal Medicine | Admitting: Internal Medicine

## 2016-02-01 ENCOUNTER — Ambulatory Visit: Payer: Medicare Other | Admitting: Physical Therapy

## 2016-02-01 DIAGNOSIS — N9489 Other specified conditions associated with female genital organs and menstrual cycle: Secondary | ICD-10-CM

## 2016-02-01 DIAGNOSIS — R634 Abnormal weight loss: Secondary | ICD-10-CM

## 2016-02-01 DIAGNOSIS — R109 Unspecified abdominal pain: Secondary | ICD-10-CM

## 2016-02-02 ENCOUNTER — Ambulatory Visit: Payer: Medicare Other | Admitting: Physical Therapy

## 2016-02-02 ENCOUNTER — Encounter: Payer: Self-pay | Admitting: Physical Therapy

## 2016-02-02 DIAGNOSIS — M6281 Muscle weakness (generalized): Secondary | ICD-10-CM

## 2016-02-02 DIAGNOSIS — R2681 Unsteadiness on feet: Secondary | ICD-10-CM

## 2016-02-02 DIAGNOSIS — R2689 Other abnormalities of gait and mobility: Secondary | ICD-10-CM

## 2016-02-03 ENCOUNTER — Encounter: Payer: Self-pay | Admitting: Physical Therapy

## 2016-02-03 ENCOUNTER — Ambulatory Visit: Payer: Medicare Other | Admitting: Physical Therapy

## 2016-02-03 DIAGNOSIS — M6281 Muscle weakness (generalized): Secondary | ICD-10-CM | POA: Diagnosis not present

## 2016-02-03 DIAGNOSIS — M6249 Contracture of muscle, multiple sites: Secondary | ICD-10-CM

## 2016-02-03 DIAGNOSIS — R2689 Other abnormalities of gait and mobility: Secondary | ICD-10-CM

## 2016-02-03 DIAGNOSIS — R2681 Unsteadiness on feet: Secondary | ICD-10-CM

## 2016-02-03 NOTE — Therapy (Signed)
[facility] [address] [place], [place], [address] Phone: [phone]   Fax:  [phone]  Physical Therapy Treatment  Patient Details  Name: Linda Lucas MRN: [id number] Date of Birth: December 10, 1970 Referring Provider: Delrae Rend, MD  Encounter Date: 02/03/2016      PT End of Session - 02/03/16 1538    Visit Number 12   Number of Visits 17   Date for PT Re-Evaluation 02/11/06   Authorization Type Medicare G-Code & Progress note   PT Start Time 1400   PT Stop Time 1442   PT Time Calculation (min) 42 min   Equipment Utilized During Treatment Gait belt   Activity Tolerance Patient tolerated treatment well;Patient limited by pain   Behavior During Therapy [facility] for tasks assessed/performed      Past Medical History:  Diagnosis Date  . Ankle fracture    bimeolar fracture  . Chronic urethritis    frequent uti's and or urethritis  . Degenerative disc disease, lumbar 2007   bulging disc  . Depression   . Diabetes mellitus 2004  . Fibromyalgia   . Glaucoma   . Hypercholesteremia   . IBS (irritable bowel syndrome)   . 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 Migraine   . Osteoarthritis   . Overactive bladder   . Prolactin increased ([place]) 05/2002   NORMAL MRI    Past Surgical History:  Procedure Laterality Date  . ABDOMINAL HYSTERECTOMY    . APPENDECTOMY  age 70  . BREAST BIOPSY     x 2  . BURCH PROCEDURE    . CARPECTOMY HAND     LEFT FOR KEINBACH'S DZ  . CATARACT EXTRACTION    . CHOLECYSTECTOMY OPEN     WITH CDE  . CYSTOSCOPY     X 4  . FRACTURE METARSAL    . FUSION OF DISCS     C 3-4 4-5 5-6 VERTBRAES IN NECK DUE TO DEGEN. [place] DZ  . FUSION OF LEFT WRIST WITH PLATE AND SCREWS  2297   CADAVER BONE GRAFT AND APPLICATION OF HUMAN GROWTH  FACTORS  . HEMORROIDECTOMY  age 70  . HYSTEROSCOPY     D&C,HYSTEROSCOPY FOR PED FIBROID   . INTERSTIM IMPLANT PLACEMENT    . ORIF RADIAL HEAD / NECK FRACTURE  2010   RIGHT RADIAL HED EXTENDING INTO JOINT SPACE WITH PLATE AND SCREWS  . RIGHT ANKLE FRACTURE    . ROTATOR CUFF REPAIR    . TONSILLECTOMY AND ADENOIDECTOMY    . TUBAL LIGATION      There were no vitals filed for this visit.      Subjective Assessment - 02/03/16 1403    Subjective Patient has no new falls to report. Her hip pain is still present and flares up when doing exercises.    Patient is accompained by: Family member   Limitations Standing;Walking   Patient Stated Goals She would like to never fall again.    Currently in Pain? Yes   Pain Score 4    Pain Location Hip   Pain Orientation Right;Left   Pain Onset More than a month ago   Pain Frequency Intermittent   Aggravating Factors  exercises   Pain Relieving Factors Heat, stopping activity   Multiple Pain Sites No   Pain Onset Other (comment)  intermitant       Balance activities down hallway  - Forward walking with head turns and nods. Occasional stumble noted, however no significant sway. Patient reported feeling like she was drifting to the R. Decreased velocity with head movements. Decreased neck rotation due to cervical spinal fusion. Tactile cues provided to assist in maintaining gait speed. Min guard for safety and cane for balance.  - Lateral steps with one UE support on wall. Patient reported feeling unsteady. VC and tactile cues to increase step length and for proper alignment of hips. Min guard for safety. Patient reported increase in hip pain during activity. From 4/10 to 6/10.   Seated balance activities  - Seated balance on 75cm blue pball. Marching, hip flexion with stepping out to the side with return to center, UE reaching with opposite LE internally rotated, extended, and raised off the ground. 20x each activity; min assist for balance;  Verbal, tactile, and demo for correct technique.       PT Education - 02/03/16 1539    Education provided Yes   Education Details Educated on [employer] senior exercise resources for after discharge.   Person(s) Educated Patient   Methods Explanation;Handout   Comprehension Verbalized understanding;Need further instruction          PT Short Term Goals - 01/15/16 1453      PT SHORT TERM GOAL #1   Title Patient demonstrates understanding of initial HEP. (Target Date: 01/15/2016)   Baseline Met, 01/13/16.   Time 4   Period Weeks   Status Achieved     PT SHORT TERM GOAL #2   Title Berg Balance >/= 36/56. (Target Date: 01/15/2016)   Baseline 01/15/16 Met Berg increased to 41   Time 4   Period Weeks   Status Achieved     PT SHORT TERM GOAL #3   Title Patient verbalizes understanding of fall prevention strategies including recommendation of assistive device. (Target Date: 01/15/2016)   Baseline Pt verbalizes understanding but is only willing to use SPC in the community; 01/13/16   Time 4   Period Weeks   Status Achieved     PT SHORT TERM GOAL #4   Title Patient ambulates with LRAD with head turns to scan environment and maintains path with supervision. (Target Date: 01/15/2016)   Baseline Pt required min guard for safety when ambulating with head turns.    Time 4   Period Weeks   Status Not Met           PT Long Term Goals - 01/28/16 1352      PT LONG TERM GOAL #1   Title Patient verbalizes & demonstrates understanding of ongoing HEP / fitness plan. (Target Date: 02/12/2016)   Time 8   Period Weeks   Status On-going     PT LONG TERM GOAL #2   Title Timed Up & Go without device <13.5sec safely to indicate lower fall risk. (Target Date: 02/12/2016)   Time 8   Period Weeks   Status On-going     PT LONG TERM GOAL #3   Title Berg Balance >48/56 to indicate lower fall risk. (Target Date: 02/12/2016)   Time 8   Period Weeks   Status Revised     PT LONG TERM GOAL #4   Title  Dynamic Gait Index >13/24 to indicate lower fall risk. (Target Date: 02/12/2016)   Time 8   Period Weeks   Status On-going     PT LONG TERM GOAL #5   Title  Patient ambulates with LRAD for safety to reduce fall risk 400' including ramps & curbs modified independent for community mobility. (Target Date: 02/12/2016)   Time 8   Period Weeks   Status On-going           Plan - 02/03/16 1547    Clinical Impression Statement Patient arrived with hip pain at 4/10. Attempted balance exercises during gait, however patient's pain increased to 6/10. Due to increased pain balance exercises were continued in sitting on pball to take stress off hips.   Rehab Potential Good   PT Frequency 2x / week   PT Duration 8 weeks   PT Treatment/Interventions ADLs/Self Care Home Management;DME Instruction;Gait training;Stair training;Functional mobility training;Therapeutic activities;Therapeutic exercise;Balance training;Neuromuscular re-education;Patient/family education;Passive range of motion   PT Next Visit Plan Begin checking LTGs; Stretch and strengthen R hip; High level balance activities; gait training household without device & community with cane   Consulted and Agree with Plan of Care Patient      Patient will benefit from skilled therapeutic intervention in order to improve the following deficits and impairments:  Abnormal gait, Decreased activity tolerance, Decreased balance, Decreased endurance, Decreased knowledge of use of DME, Decreased mobility, Decreased strength, Impaired flexibility, Postural dysfunction, Pain  Visit Diagnosis: Muscle weakness (generalized)  Other abnormalities of gait and mobility  Unsteadiness on feet  Contracture of muscle, multiple sites     Problem List Patient Active Problem List   Diagnosis Date Noted  . Difficulty walking 06/19/2014  . Overactive bladder   . Migraine   . Fibromyalgia   . Chronic fatigue syndrome   . Metatarsal fracture   . Diabetes  mellitus   . Ankle fracture   . Osteoarthritis   . IBS (irritable bowel syndrome)   . Depression   . Chronic urethritis   . Degenerative disc disease, lumbar   . Hypercholesteremia   . Prolactin increased ([place])     Benjiman Core, SPTA 02/03/2016, 3:51 PM  [facility] [address] [place], [place], [address] Phone: [phone]   Fax:  [phone]  Name: JESLYN AMSLER MRN: [id number] Date of Birth: Apr 18, 1945

## 2016-02-03 NOTE — Therapy (Signed)
[facility] [address] [place], [place], [address] Phone: [phone]   Fax:  [phone]  Physical Therapy Treatment  Patient Details  Name: Linda Lucas MRN: [id number] Date of Birth: [birth year] Referring Provider: Delrae Rend, MD  Encounter Date: 02/02/2016      PT End of Session - 02/02/16 1400    Visit Number 11   Number of Visits 17   Date for PT Re-Evaluation 02/11/06   Authorization Type Medicare G-Code & Progress note   PT Start Time 1316   PT Stop Time 1400   PT Time Calculation (min) 44 min   Equipment Utilized During Treatment Gait belt   Activity Tolerance Patient tolerated treatment well   Behavior During Therapy [facility] for tasks assessed/performed      Past Medical History:  Diagnosis Date  . Ankle fracture    bimeolar fracture  . Chronic urethritis    frequent uti's and or urethritis  . Degenerative disc disease, lumbar 2007   bulging disc  . Depression   . Diabetes mellitus 2004  . Fibromyalgia   . Glaucoma   . Hypercholesteremia   . IBS (irritable bowel syndrome)   . 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 Migraine   . Osteoarthritis   . Overactive bladder   . Prolactin increased ([place]) 05/2002   NORMAL MRI    Past Surgical History:  Procedure Laterality Date  . ABDOMINAL HYSTERECTOMY    . APPENDECTOMY  age 57  . BREAST BIOPSY     x 2  . BURCH PROCEDURE    . CARPECTOMY HAND     LEFT FOR KEINBACH'S DZ  . CATARACT EXTRACTION    . CHOLECYSTECTOMY OPEN     WITH CDE  . CYSTOSCOPY     X 4  . FRACTURE METARSAL    . FUSION OF DISCS     C 3-4 4-5 5-6 VERTBRAES IN NECK DUE TO DEGEN. [place] DZ  . FUSION OF LEFT WRIST WITH PLATE AND SCREWS  7048   CADAVER BONE GRAFT AND APPLICATION OF HUMAN GROWTH FACTORS  .  HEMORROIDECTOMY  age 1  . HYSTEROSCOPY     D&C,HYSTEROSCOPY FOR PED FIBROID   . INTERSTIM IMPLANT PLACEMENT    . ORIF RADIAL HEAD / NECK FRACTURE  2010   RIGHT RADIAL HED EXTENDING INTO JOINT SPACE WITH PLATE AND SCREWS  . RIGHT ANKLE FRACTURE    . ROTATOR CUFF REPAIR    . TONSILLECTOMY AND ADENOIDECTOMY    . TUBAL LIGATION      There were no vitals filed for this visit.      Subjective Assessment - 02/02/16 1320    Subjective No falls. Back is better. She has been doing the exercises. She has not been using the cane much.      Limitations Standing;Walking   Patient Stated Goals She would like to never fall again.    Currently in Pain? Yes   Pain Score 3    Pain Location Back   Pain Orientation Lower   Pain Descriptors / Indicators Aching   Pain Type Chronic pain   Pain Onset More than a month ago   Pain Frequency Constant   Pain Relieving Factors medications & some of exercises   Pain Location Hip   Pain Orientation Right  [facility] Adult PT Treatment/Exercise - 02/02/16 1315      Ambulation/Gait   Stairs Yes   Stairs Assistance 5: Supervision   Stair Management Technique Two rails;Alternating pattern;Forwards   Number of Stairs 4  2 reps   Ramp 5: Supervision  no device   Curb 5: Supervision  no device             Balance Exercises - 02/02/16 1315      Balance Exercises: Standing   Standing Eyes Opened Head turns;Wide (BOA);5 reps  standing crossways on foam beam   Standing Eyes Closed Head turns;Wide (BOA);Solid surface;5 reps  lateral, up/down & diagonals   Stepping Strategy Anterior;Posterior;5 reps  alternate stepping on/off beam & hold 5 sec   Step Ups Forward;Lateral;4 inch;Intermittent UE support  PT demo, tactile, verbal cues on proper technique   Gait with Head Turns Forward;2 reps  in hall for walls as visual reference, tactile cues    Turning Right;Left;10 reps  used colored tiles as visual cues &  verbal cues   Heel Raises Limitations off beam for increased ROM required UE support   Toe Raise Limitations off beam for increased ROM required UE support           PT Education - 02/02/16 1315    Education provided Yes   Education Details ITB rolling with frozen water bottle, need for ongoing HEP /fitness plan after discharge including flexibility, strength, balance & endurance   Person(s) Educated Patient   Methods Explanation;Verbal cues;Demonstration   Comprehension Verbalized understanding;Verbal cues required;Need further instruction          PT Short Term Goals - 01/15/16 1453      PT SHORT TERM GOAL #1   Title Patient demonstrates understanding of initial HEP. (Target Date: 01/15/2016)   Baseline Met, 01/13/16.   Time 4   Period Weeks   Status Achieved     PT SHORT TERM GOAL #2   Title Berg Balance >/= 36/56. (Target Date: 01/15/2016)   Baseline 01/15/16 Met Berg increased to 41   Time 4   Period Weeks   Status Achieved     PT SHORT TERM GOAL #3   Title Patient verbalizes understanding of fall prevention strategies including recommendation of assistive device. (Target Date: 01/15/2016)   Baseline Pt verbalizes understanding but is only willing to use SPC in the community; 01/13/16   Time 4   Period Weeks   Status Achieved     PT SHORT TERM GOAL #4   Title Patient ambulates with LRAD with head turns to scan environment and maintains path with supervision. (Target Date: 01/15/2016)   Baseline Pt required min guard for safety when ambulating with head turns.    Time 4   Period Weeks   Status Not Met           PT Long Term Goals - 01/28/16 1352      PT LONG TERM GOAL #1   Title Patient verbalizes & demonstrates understanding of ongoing HEP / fitness plan. (Target Date: 02/12/2016)   Time 8   Period Weeks   Status On-going     PT LONG TERM GOAL #2   Title Timed Up & Go without device <13.5sec safely to indicate lower fall risk. (Target Date: 02/12/2016)    Time 8   Period Weeks   Status On-going     PT LONG TERM GOAL #3   Title Berg Balance >48/56 to indicate lower fall risk. (Target Date: 02/12/2016)   Time  8   Period Weeks   Status Revised     PT LONG TERM GOAL #4   Title Dynamic Gait Index >13/24 to indicate lower fall risk. (Target Date: 02/12/2016)   Time 8   Period Weeks   Status On-going     PT LONG TERM GOAL #5   Title Patient ambulates with LRAD for safety to reduce fall risk 400' including ramps & curbs modified independent for community mobility. (Target Date: 02/12/2016)   Time 8   Period Weeks   Status On-going               Plan - 02/02/16 1400    Clinical Impression Statement Patient is on target to meet LTGs by end of next week per plan of care. She reports less chronic back pain issues with activites.    Rehab Potential Good   PT Frequency 2x / week   PT Duration 8 weeks   PT Treatment/Interventions ADLs/Self Care Home Management;DME Instruction;Gait training;Stair training;Functional mobility training;Therapeutic activities;Therapeutic exercise;Balance training;Neuromuscular re-education;Patient/family education;Passive range of motion   PT Next Visit Plan Stretch and strengthen R hip; High level balance activities; gait training household without device & community with cane   Consulted and Agree with Plan of Care Patient      Patient will benefit from skilled therapeutic intervention in order to improve the following deficits and impairments:  Abnormal gait, Decreased activity tolerance, Decreased balance, Decreased endurance, Decreased knowledge of use of DME, Decreased mobility, Decreased strength, Impaired flexibility, Postural dysfunction, Pain  Visit Diagnosis: Muscle weakness (generalized)  Other abnormalities of gait and mobility  Unsteadiness on feet     Problem List Patient Active Problem List   Diagnosis Date Noted  . Difficulty walking 06/19/2014  . Overactive bladder   . Migraine    . Fibromyalgia   . Chronic fatigue syndrome   . Metatarsal fracture   . Diabetes mellitus   . Ankle fracture   . Osteoarthritis   . IBS (irritable bowel syndrome)   . Depression   . Chronic urethritis   . Degenerative disc disease, lumbar   . Hypercholesteremia   . Prolactin increased ([place])     Savion Washam PT, DPT 02/03/2016, 6:31 AM  [facility] [address] [place], [place], [address] Phone: [phone]   Fax:  [phone]  Name: Linda Lucas MRN: [id number] Date of Birth: 1946-02-24

## 2016-02-09 ENCOUNTER — Encounter: Payer: Self-pay | Admitting: Physical Therapy

## 2016-02-09 ENCOUNTER — Ambulatory Visit: Payer: Medicare Other | Admitting: Physical Therapy

## 2016-02-09 DIAGNOSIS — R2689 Other abnormalities of gait and mobility: Secondary | ICD-10-CM

## 2016-02-09 DIAGNOSIS — R2681 Unsteadiness on feet: Secondary | ICD-10-CM

## 2016-02-09 DIAGNOSIS — M6281 Muscle weakness (generalized): Secondary | ICD-10-CM

## 2016-02-10 NOTE — Therapy (Signed)
[facility] [address] [place], [place], [address] Phone: [phone]   Fax:  [phone]  Physical Therapy Treatment  Patient Details  Name: Linda Lucas MRN: [id number] Date of Birth: October 01, 1945 Referring Provider: Delrae Rend, MD  Encounter Date: 02/09/2016      PT End of Session - 02/09/16 1445    Visit Number 13   Number of Visits 17   Date for PT Re-Evaluation 02/11/06   Authorization Type Medicare G-Code & Progress note   PT Start Time 1400   PT Stop Time 1445   PT Time Calculation (min) 45 min   Equipment Utilized During Treatment Gait belt   Activity Tolerance Patient tolerated treatment well;Patient limited by pain   Behavior During Therapy [facility] for tasks assessed/performed      Past Medical History:  Diagnosis Date  . Ankle fracture    bimeolar fracture  . Chronic urethritis    frequent uti's and or urethritis  . Degenerative disc disease, lumbar 2007   bulging disc  . Depression   . Diabetes mellitus 2004  . Fibromyalgia   . Glaucoma   . Hypercholesteremia   . IBS (irritable bowel syndrome)   . 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 Migraine   . Osteoarthritis   . Overactive bladder   . Prolactin increased ([place]) 05/2002   NORMAL MRI    Past Surgical History:  Procedure Laterality Date  . ABDOMINAL HYSTERECTOMY    . APPENDECTOMY  age 31  . BREAST BIOPSY     x 2  . BURCH PROCEDURE    . CARPECTOMY HAND     LEFT FOR KEINBACH'S DZ  . CATARACT EXTRACTION    . CHOLECYSTECTOMY OPEN     WITH CDE  . CYSTOSCOPY     X 4  . FRACTURE METARSAL    . FUSION OF DISCS     C 3-4 4-5 5-6 VERTBRAES IN NECK DUE TO DEGEN. [place] DZ  . FUSION OF LEFT WRIST WITH PLATE AND SCREWS  2595   CADAVER BONE GRAFT AND APPLICATION OF HUMAN GROWTH  FACTORS  . HEMORROIDECTOMY  age 4  . HYSTEROSCOPY     D&C,HYSTEROSCOPY FOR PED FIBROID   . INTERSTIM IMPLANT PLACEMENT    . ORIF RADIAL HEAD / NECK FRACTURE  2010   RIGHT RADIAL HED EXTENDING INTO JOINT SPACE WITH PLATE AND SCREWS  . RIGHT ANKLE FRACTURE    . ROTATOR CUFF REPAIR    . TONSILLECTOMY AND ADENOIDECTOMY    . TUBAL LIGATION      There were no vitals filed for this visit.          [facility] PT Assessment - 02/09/16 1400      Berg Balance Test   Sit to Stand Able to stand without using hands and stabilize independently   Standing Unsupported Able to stand safely 2 minutes   Sitting with Back Unsupported but Feet Supported on Floor or Stool Able to sit safely and securely 2 minutes   Stand to Sit Sits safely with minimal use of hands   Transfers Able to transfer safely, minor use of hands   Standing Unsupported with Eyes Closed Able to stand 10 seconds safely   Standing Ubsupported with Feet Together Able to place feet together independently and stand 1 minute safely  From Standing, Reach Forward with Outstretched Arm Can reach forward >12 cm safely (5")   From Standing Position, Pick up Object from [place] to pick up shoe safely and easily   From Standing Position, Turn to Look Behind Over each Shoulder Looks behind from both sides and weight shifts well   Turn 360 Degrees Able to turn 360 degrees safely but slowly   Standing Unsupported, Alternately Place Feet on Step/Stool Able to stand independently and safely and complete 8 steps in 20 seconds   Standing Unsupported, One Foot in Front Able to plae foot ahead of the other independently and hold 30 seconds   Standing on One Leg Tries to lift leg/unable to hold 3 seconds but remains standing independently   Total Score 49   Berg comment: Initial was 32/56                     [facility] Adult PT Treatment/Exercise - 02/09/16 1400      Ambulation/Gait   Ambulation/Gait Yes   Ambulation/Gait Assistance 5:  Supervision   Ambulation/Gait Assistance Details verbal cues on posture & balance reactions with turns, negotiating around furniture and scanning environment.    Ambulation Distance (Feet) 500 Feet   Assistive device None;Straight cane  none household & limited community, cane community   Ambulation Surface Indoor;Level   Stairs Yes   Stairs Assistance 6: Modified independent (Device/Increase time)   Stair Management Technique Two rails;Alternating pattern;Forwards   Number of Stairs 4     High Level Balance   High Level Balance Activities Side stepping;Backward walking;Turns;Head turns;Negotitating around obstacles   High Level Balance Comments verbal & tactile cues                PT Education - 02/09/16 1400    Education provided Yes   Education Details proper technique for sit to/from stand & turning to look behind shoulders. Ongoing exercise / fitness plan to include flexibility, strength, balance & endurance components with consideration to group exercise classes at [facility]) Educated Patient   Methods Explanation;Demonstration;Verbal cues   Comprehension Verbalized understanding          PT Short Term Goals - 01/15/16 1453      PT SHORT TERM GOAL #1   Title Patient demonstrates understanding of initial HEP. (Target Date: 01/15/2016)   Baseline Met, 01/13/16.   Time 4   Period Weeks   Status Achieved     PT SHORT TERM GOAL #2   Title Berg Balance >/= 36/56. (Target Date: 01/15/2016)   Baseline 01/15/16 Met Berg increased to 41   Time 4   Period Weeks   Status Achieved     PT SHORT TERM GOAL #3   Title Patient verbalizes understanding of fall prevention strategies including recommendation of assistive device. (Target Date: 01/15/2016)   Baseline Pt verbalizes understanding but is only willing to use SPC in the community; 01/13/16   Time 4   Period Weeks   Status Achieved     PT SHORT TERM GOAL #4   Title Patient ambulates with LRAD with  head turns to scan environment and maintains path with supervision. (Target Date: 01/15/2016)   Baseline Pt required min guard for safety when ambulating with head turns.    Time 4   Period Weeks   Status Not Met           PT Long Term Goals - 02/09/16 1445      PT LONG  TERM GOAL #1   Title Patient verbalizes & demonstrates understanding of ongoing HEP / fitness plan. (Target Date: 02/12/2016)   Time 8   Period Weeks   Status On-going     PT LONG TERM GOAL #2   Title Timed Up & Go without device <13.5sec safely to indicate lower fall risk. (Target Date: 02/12/2016)   Time 8   Period Weeks   Status On-going     PT LONG TERM GOAL #3   Title Berg Balance >48/56 to indicate lower fall risk. (Target Date: 02/12/2016)   Baseline MET 02/09/2016 Berg Balance 49/56   Time 8   Period Weeks   Status Achieved     PT LONG TERM GOAL #4   Title Dynamic Gait Index >13/24 to indicate lower fall risk. (Target Date: 02/12/2016)   Time 8   Period Weeks   Status On-going     PT LONG TERM GOAL #5   Title Patient ambulates with LRAD for safety to reduce fall risk 400' including ramps & curbs modified independent for community mobility. (Target Date: 02/12/2016)   Time 8   Period Weeks   Status On-going               Plan - 02/09/16 1445    Clinical Impression Statement Patient improved Berg Balance to 49/56 indicating lower fall risk. PT anticipates meeting remaining LTGs next session and discharging. Pt verbalizes understanding of need for ongoing fitness / exercise plan.    Rehab Potential Good   PT Frequency 2x / week   PT Duration 8 weeks   PT Treatment/Interventions ADLs/Self Care Home Management;DME Instruction;Gait training;Stair training;Functional mobility training;Therapeutic activities;Therapeutic exercise;Balance training;Neuromuscular re-education;Patient/family education;Passive range of motion   PT Next Visit Plan check LTGs; gait household without device & community with  cane; review HEP   Consulted and Agree with Plan of Care Patient      Patient will benefit from skilled therapeutic intervention in order to improve the following deficits and impairments:  Abnormal gait, Decreased activity tolerance, Decreased balance, Decreased endurance, Decreased knowledge of use of DME, Decreased mobility, Decreased strength, Impaired flexibility, Postural dysfunction, Pain  Visit Diagnosis: Muscle weakness (generalized)  Other abnormalities of gait and mobility  Unsteadiness on feet     Problem List Patient Active Problem List   Diagnosis Date Noted  . Difficulty walking 06/19/2014  . Overactive bladder   . Migraine   . Fibromyalgia   . Chronic fatigue syndrome   . Metatarsal fracture   . Diabetes mellitus   . Ankle fracture   . Osteoarthritis   . IBS (irritable bowel syndrome)   . Depression   . Chronic urethritis   . Degenerative disc disease, lumbar   . Hypercholesteremia   . Prolactin increased ([place])     Ruble Pumphrey PT, DPT 02/10/2016, 10:48 AM  [facility] [address] [place], [place], [address] Phone: [phone]   Fax:  [phone]  Name: Linda Lucas MRN: [id number] Date of Birth: [birth year]

## 2016-02-11 ENCOUNTER — Ambulatory Visit: Payer: Medicare Other | Admitting: Physical Therapy

## 2016-02-11 ENCOUNTER — Encounter: Payer: Self-pay | Admitting: Physical Therapy

## 2016-02-11 DIAGNOSIS — M6281 Muscle weakness (generalized): Secondary | ICD-10-CM | POA: Diagnosis not present

## 2016-02-11 DIAGNOSIS — R2681 Unsteadiness on feet: Secondary | ICD-10-CM

## 2016-02-11 DIAGNOSIS — R2689 Other abnormalities of gait and mobility: Secondary | ICD-10-CM

## 2016-02-11 NOTE — Therapy (Signed)
[facility] [address] [employer] [place] [place], [place], [address] Phone: [phone]   Fax:  [phone]  Physical Therapy Treatment  Patient Details  Name: Linda Lucas MRN: [id number] Date of Birth: 25-Feb-1946 Referring Provider: Delrae Rend, MD  Encounter Date: 02/11/2016      PT End of Session - 02/11/16 2201    Visit Number 14   Number of Visits 17   Date for PT Re-Evaluation 02/11/06   Authorization Type Medicare G-Code & Progress note   PT Start Time 1400   PT Stop Time 1444   PT Time Calculation (min) 44 min   Equipment Utilized During Treatment Gait belt   Activity Tolerance Patient tolerated treatment well;Patient limited by pain   Behavior During Therapy [facility] for tasks assessed/performed      Past Medical History:  Diagnosis Date  . Ankle fracture    bimeolar fracture  . Chronic urethritis    frequent uti's and or urethritis  . Degenerative disc disease, lumbar 2007   bulging disc  . Depression   . Diabetes mellitus 2004  . Fibromyalgia   . Glaucoma   . Hypercholesteremia   . IBS (irritable bowel syndrome)   . 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 Migraine   . Osteoarthritis   . Overactive bladder   . Prolactin increased ([place]) 05/2002   NORMAL MRI    Past Surgical History:  Procedure Laterality Date  . ABDOMINAL HYSTERECTOMY    . APPENDECTOMY  age 43  . BREAST BIOPSY     x 2  . BURCH PROCEDURE    . CARPECTOMY HAND     LEFT FOR KEINBACH'S DZ  . CATARACT EXTRACTION    . CHOLECYSTECTOMY OPEN     WITH CDE  . CYSTOSCOPY     X 4  . FRACTURE METARSAL    . FUSION OF DISCS     C 3-4 4-5 5-6 VERTBRAES IN NECK DUE TO DEGEN. [place] DZ  . FUSION OF LEFT WRIST WITH PLATE AND SCREWS  7322   CADAVER BONE GRAFT AND APPLICATION OF HUMAN GROWTH  FACTORS  . HEMORROIDECTOMY  age 12  . HYSTEROSCOPY     D&C,HYSTEROSCOPY FOR PED FIBROID   . INTERSTIM IMPLANT PLACEMENT    . ORIF RADIAL HEAD / NECK FRACTURE  2010   RIGHT RADIAL HED EXTENDING INTO JOINT SPACE WITH PLATE AND SCREWS  . RIGHT ANKLE FRACTURE    . ROTATOR CUFF REPAIR    . TONSILLECTOMY AND ADENOIDECTOMY    . TUBAL LIGATION      There were no vitals filed for this visit.          [facility] PT Assessment - 02/11/16 1400      Ambulation/Gait   Ambulation/Gait Yes   Ambulation/Gait Assistance 6: Modified independent (Device/Increase time)   Ambulation/Gait Assistance Details PT recommended no device for basic community (limited distances on firm surfaces) and cane with full community   Ambulation Distance (Feet) 500 Feet   Assistive device None   Gait Pattern Within Functional Limits   Ambulation Surface Indoor;Level;Outdoor;Paved;Gravel;Grass   Gait velocity 2.77 ft/sec   Stairs Yes   Stairs Assistance 6: Modified independent (Device/Increase time)   Stair Management Technique One rail Right;Step to pattern;Forwards   Number of Stairs 4   Ramp 6: Modified independent (Device)  no device   Curb 6: Modified independent (Device/increase time)  no device     Dynamic Gait Index   Level Surface Mild Impairment   Change in Gait Speed Mild Impairment   Gait with Horizontal Head Turns Mild Impairment   Gait with Vertical Head Turns Mild Impairment   Gait and Pivot Turn Mild Impairment   Step Over Obstacle Moderate Impairment   Step Around Obstacles Mild Impairment   Steps Moderate Impairment   Total Score 14                               PT Short Term Goals - 01/15/16 1453      PT SHORT TERM GOAL #1   Title Patient demonstrates understanding of initial HEP. (Target Date: 01/15/2016)   Baseline Met, 01/13/16.   Time 4   Period Weeks   Status Achieved     PT SHORT TERM GOAL #2   Title Berg Balance >/= 36/56. (Target Date: 01/15/2016)    Baseline 01/15/16 Met Berg increased to 41   Time 4   Period Weeks   Status Achieved     PT SHORT TERM GOAL #3   Title Patient verbalizes understanding of fall prevention strategies including recommendation of assistive device. (Target Date: 01/15/2016)   Baseline Pt verbalizes understanding but is only willing to use SPC in the community; 01/13/16   Time 4   Period Weeks   Status Achieved     PT SHORT TERM GOAL #4   Title Patient ambulates with LRAD with head turns to scan environment and maintains path with supervision. (Target Date: 01/15/2016)   Baseline Pt required min guard for safety when ambulating with head turns.    Time 4   Period Weeks   Status Not Met           PT Long Term Goals - 02/11/16 2202      PT LONG TERM GOAL #1   Title Patient verbalizes & demonstrates understanding of ongoing HEP / fitness plan. (Target Date: 02/12/2016)   Baseline MET 02/11/2016   Time 8   Period Weeks   Status Achieved     PT LONG TERM GOAL #2   Title Timed Up & Go without device <13.5sec safely to indicate lower fall risk. (Target Date: 02/12/2016)   Baseline MET 02/11/2016 TUG 11.89sec   Time 8   Period Weeks   Status Achieved     PT LONG TERM GOAL #3   Title Berg Balance >48/56 to indicate lower fall risk. (Target Date: 02/12/2016)   Baseline MET 02/09/2016 Berg Balance 49/56   Time 8   Period Weeks   Status Achieved     PT LONG TERM GOAL #4   Title Dynamic Gait Index >13/24 to indicate lower fall risk. (Target Date: 02/12/2016)   Baseline MET 02/11/2016  DGI 14/24   Time 8   Period Weeks   Status Achieved     PT LONG TERM GOAL #5   Title Patient ambulates with LRAD for safety to reduce fall risk 400' including ramps & curbs modified independent for community mobility. (Target Date: 02/12/2016)   Baseline MET 02/11/2016   Time 8   Period Weeks   Status Achieved               Plan - 02/11/16 2209    Clinical Impression Statement Patient met all LTGs. She  appears to have decreased her fall risk with  improved Berg Balance to 49/56, TUG 11.89sec & DGI 14/24. She verbalized understanding of ongoing HEP.    Rehab Potential Good   PT Frequency 2x / week   PT Duration 8 weeks   PT Treatment/Interventions ADLs/Self Care Home Management;DME Instruction;Gait training;Stair training;Functional mobility training;Therapeutic activities;Therapeutic exercise;Balance training;Neuromuscular re-education;Patient/family education;Passive range of motion   PT Next Visit Plan discharge   Consulted and Agree with Plan of Care Patient      Patient will benefit from skilled therapeutic intervention in order to improve the following deficits and impairments:  Abnormal gait, Decreased activity tolerance, Decreased balance, Decreased endurance, Decreased knowledge of use of DME, Decreased mobility, Decreased strength, Impaired flexibility, Postural dysfunction, Pain  Visit Diagnosis: Muscle weakness (generalized)  Other abnormalities of gait and mobility  Unsteadiness on feet       G-Codes - 2016-03-02 2209-05-14    Functional Assessment Tool Used Berg Balance 49.56   Functional Limitation Mobility: Walking and moving around   Mobility: Walking and Moving Around Goal Status [phone]) At least 20 percent but less than 40 percent impaired, limited or restricted   Mobility: Walking and Moving Around Discharge Status [phone]) At least 20 percent but less than 40 percent impaired, limited or restricted      Problem List Patient Active Problem List   Diagnosis Date Noted  . Difficulty walking 06/19/2014  . Overactive bladder   . Migraine   . Fibromyalgia   . Chronic fatigue syndrome   . Metatarsal fracture   . Diabetes mellitus   . Ankle fracture   . Osteoarthritis   . IBS (irritable bowel syndrome)   . Depression   . Chronic urethritis   . Degenerative disc disease, lumbar   . Hypercholesteremia   . Prolactin increased ([place])    PHYSICAL THERAPY DISCHARGE  SUMMARY  Visits from Start of Care: 14  Current functional level related to goals / functional outcomes: See above   Remaining deficits: See above   Education / Equipment: HEP / ongoing fitness plan  Plan: Patient agrees to discharge.  Patient goals were met. Patient is being discharged due to meeting the stated rehab goals.  ?????          Skylan Gift PT, DPT 03-02-2016, 10:12 PM  [facility] [address] [place], [place], [address] Phone: [phone]   Fax:  [phone]  Name: Linda Lucas MRN: [id number] Date of Birth: [birth year]

## 2016-05-25 ENCOUNTER — Ambulatory Visit: Payer: Medicare Other | Admitting: Physician Assistant

## 2016-05-26 ENCOUNTER — Telehealth: Payer: Self-pay | Admitting: Cardiovascular Disease

## 2016-05-26 NOTE — Telephone Encounter (Signed)
Received requested records from Dr. Sharl MaKerr. Given to Medical records to add with other records for 3/21 schedule for Dr. Duke Salviaandolph.

## 2016-06-01 ENCOUNTER — Ambulatory Visit (INDEPENDENT_AMBULATORY_CARE_PROVIDER_SITE_OTHER): Payer: Medicare Other | Admitting: Cardiovascular Disease

## 2016-06-01 ENCOUNTER — Encounter: Payer: Self-pay | Admitting: Cardiovascular Disease

## 2016-06-01 VITALS — BP 124/70 | HR 76 | Ht 63.0 in | Wt 182.0 lb

## 2016-06-01 DIAGNOSIS — I10 Essential (primary) hypertension: Secondary | ICD-10-CM

## 2016-06-01 DIAGNOSIS — E6609 Other obesity due to excess calories: Secondary | ICD-10-CM | POA: Diagnosis not present

## 2016-06-01 DIAGNOSIS — E7800 Pure hypercholesterolemia, unspecified: Secondary | ICD-10-CM | POA: Diagnosis not present

## 2016-06-01 DIAGNOSIS — R079 Chest pain, unspecified: Secondary | ICD-10-CM

## 2016-06-01 NOTE — Patient Instructions (Signed)
Medication Instructions: Your physician recommends that you continue on your current medications as directed. Please refer to the Current Medication list given to you today.   Procedures/Testing: Your physician has requested that you have a lexiscan myoview. For further information please visit [email or web address] Please follow instruction sheet, as given. This [address] be done [address]    Follow-Up: Your physician recommends that you schedule a follow-up appointment in: one month with Dr. Duke Salviaandolph    If you need a refill on your cardiac medications before your next appointment, please call your pharmacy.

## 2016-06-01 NOTE — Progress Notes (Signed)
Cardiology Office Note   Date:  06/01/2016   ID:  Linda Lucas, DOB Jan 13, 1946, MRN [id number]  PCP:  Clelia Schaumann, MD  Cardiologist:   Chilton Si, MD   Chief Complaint  Patient presents with  . New Patient (Initial Visit)    History of Present Illness: Linda Lucas is a 71 y.o. female with hyperlipidemia, hypothyroidism and diabetes who presents for an evaluation of chest pain. Linda Lucas saw Dr. Tinnie Gens her on 05/16/16 and reported chest pain.  Linda Lucas reports 6 months of aching chest pain. This lasts for several minutes to an hour. It typically occurs while sitting. Linda Lucas does not get much exercise. Linda Lucas does chair yoga. Linda Lucas denies chest pain with this activity. The sensation is 5 out of 10 in severity and does not radiate. There is no associated shortness of breath, nausea, or diaphoresis. Linda Lucas also notes that when laying down or after eating. Linda Lucas is not eating spicy foods.  Linda Lucas denies lightheadednes/dizziness, lower extremity edema, orthopnea or PND.  Linda Lucas occasionally has palpitations that last for a few seconds at a time.  Linda Lucas is especially concerned about her chest pain because both her brother and sister have experienced heart attacks.    Past Medical History:  Diagnosis Date  . Ankle fracture    bimeolar fracture  . Chronic urethritis    frequent uti's and or urethritis  . Degenerative disc disease, lumbar 2007   bulging disc  . Depression   . Diabetes mellitus 2004  . Fibromyalgia   . Glaucoma   . Hypercholesteremia   . IBS (irritable bowel syndrome)   . 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 Migraine   . Osteoarthritis   . Overactive bladder   . Prolactin increased (HCC) 05/2002   NORMAL MRI    Past Surgical History:  Procedure Laterality Date  . ABDOMINAL HYSTERECTOMY    . APPENDECTOMY   age 67  . BREAST BIOPSY     x 2  . BURCH PROCEDURE    . CARPECTOMY HAND     LEFT FOR KEINBACH'S DZ  . CATARACT EXTRACTION    . CHOLECYSTECTOMY OPEN     WITH CDE  . CYSTOSCOPY     X 4  . FRACTURE METARSAL    . FUSION OF DISCS     C 3-4 4-5 5-6 VERTBRAES IN NECK DUE TO DEGEN. DISC DZ  . FUSION OF LEFT WRIST WITH PLATE AND SCREWS  2009   CADAVER BONE GRAFT AND APPLICATION OF HUMAN GROWTH FACTORS  . HEMORROIDECTOMY  age 49  . HYSTEROSCOPY     D&C,HYSTEROSCOPY FOR PED FIBROID   . INTERSTIM IMPLANT PLACEMENT    . ORIF RADIAL HEAD / NECK FRACTURE  2010   RIGHT RADIAL HED EXTENDING INTO JOINT SPACE WITH PLATE AND SCREWS  . RIGHT ANKLE FRACTURE    . ROTATOR CUFF REPAIR    . TONSILLECTOMY AND ADENOIDECTOMY    . TUBAL LIGATION       Current Outpatient Prescriptions  Medication Sig Dispense Refill  . almotriptan (AXERT) 12.5 MG tablet Take 12.5 mg by mouth as needed. may repeat in 2 hours if needed     . ARIPiprazole (ABILIFY) 5 MG tablet Take 5 mg by mouth daily. Taking 1.5 tablets starting 01/13/2016    . aspirin 81  MG tablet Take 81 mg by mouth 2 (two) times daily. Takes two tabs am and pm.     . atorvastatin (LIPITOR) 40 MG tablet Take 40 mg by mouth daily.      . calcium citrate-vitamin D (CITRACAL+D) 315-200 MG-UNIT tablet Take 1 tablet by mouth daily.     . clonazePAM (KLONOPIN) 1 MG tablet Take 1 mg by mouth 2 (two) times daily as needed for anxiety.    . conjugated estrogens (PREMARIN) vaginal cream Apply 1 gram vaginally twice weekly 42.5 g 2  . DiphenhydrAMINE HCl (BENADRYL PO) Take by mouth daily as needed.     . eletriptan (RELPAX) 40 MG tablet One tablet by mouth as needed for migraine headache.  If the headache improves and then returns, dose may be repeated after 2 hours have elapsed since first dose (do not exceed 80 mg per day). may repeat in 2 hours if necessary     . empagliflozin (JARDIANCE) 10 MG TABS tablet Take 10 mg by mouth daily.    . empagliflozin (JARDIANCE)  25 MG TABS tablet Take 25 mg by mouth daily.    Marland Kitchen estradiol (ESTRACE) 1 MG tablet TAKE ONE (1) TABLET EACH DAY (Patient taking differently: TAKE ONE (1) TABLET EACH DAY Reports 1/2 tab) 30 tablet 12  . FIBER PO Take by mouth.    . fluconazole (DIFLUCAN) 200 MG tablet TAKE 1 TABLET BY MOUTH AS NEEDED FOR ITCHING 5 tablet 0  . HYDROcodone-acetaminophen (NORCO/VICODIN) 5-325 MG tablet Take 1-2 tablets by mouth every 4 (four) hours as needed. (Patient taking differently: Take 1-2 tablets by mouth 2 (two) times daily as needed. ) 12 tablet 0  . ibuprofen (ADVIL,MOTRIN) 200 MG tablet Take 200 mg by mouth every 6 (six) hours as needed.    Marland Kitchen levothyroxine (SYNTHROID) 100 MCG tablet Take 100 mcg by mouth daily.      Marland Kitchen lisinopril (PRINIVIL,ZESTRIL) 10 MG tablet Take 10 mg by mouth daily.    Marland Kitchen loratadine (CLARITIN) 10 MG tablet Take 10 mg by mouth daily.    . Multiple Vitamin (MULTIVITAMIN) tablet Take 1 tablet by mouth daily.    Marland Kitchen NADOLOL PO Take 80 mg by mouth daily.     Marland Kitchen omeprazole (PRILOSEC) 20 MG capsule Take 20 mg by mouth daily.    Marland Kitchen oxybutynin (DITROPAN-XL) 10 MG 24 hr tablet Take 10 mg by mouth 2 (two) times daily.     . promethazine (PHENERGAN) 25 MG tablet Take 25 mg by mouth every 6 (six) hours as needed.      . ranitidine (ZANTAC) 150 MG tablet Take 150 mg by mouth as needed for heartburn.    . Rizatriptan Benzoate (MAXALT PO) Take by mouth.      Marland Kitchen rOPINIRole (REQUIP) 0.5 MG tablet Take 0.5 mg by mouth 3 (three) times daily.    . SUMAtriptan (IMITREX) 100 MG tablet Take 100 mg by mouth every 2 (two) hours as needed.      . Suvorexant (BELSOMRA) 20 MG TABS Take by mouth.    . trimethoprim (TRIMPEX) 100 MG tablet Take 100 mg by mouth 2 (two) times daily.    . Venlafaxine HCl (EFFEXOR PO) Take by mouth 2 (two) times daily.     Marland Kitchen zolmitriptan (ZOMIG) 5 MG tablet Take 5 mg by mouth as needed.      . zolpidem (AMBIEN) 10 MG tablet Take 10 mg by mouth at bedtime as needed.       No current  facility-administered medications  for this visit.     Allergies:   Aspirin; Latex; Oxycodone-acetaminophen; Oxymorphone hcl; and Ivp dye [iodinated diagnostic agents]    Social History:  The patient  reports that Linda Lucas has never smoked. Linda Lucas has never used smokeless tobacco. Linda Lucas reports that Linda Lucas does not drink alcohol or use drugs.   Family History:  The patient's family history includes Cancer in her maternal grandmother; Diabetes in her brother, father, mother, and sister; Heart attack in her brother, mother, and sister; Heart disease in her father and mother; Hypertension in her father and mother; Osteoarthritis in her sister; Other in her daughter and son; Rheumatic fever in her father.    ROS:  Please see the history of present illness.   Otherwise, review of systems are positive for none.   All other systems are reviewed and negative.    PHYSICAL EXAM: VS:  BP 137/79   Pulse 76   Ht 5\' 3"  (1.6 m)   Wt 82.6 kg (182 lb)   BMI 32.24 kg/m  , BMI Body mass index is 32.24 kg/m. GENERAL:  Well appearing HEENT:  Pupils equal round and reactive, fundi not visualized, oral mucosa unremarkable NECK:  No jugular venous distention, waveform within normal limits, carotid upstroke brisk and symmetric, no bruits, no thyromegaly LYMPHATICS:  No cervical adenopathy LUNGS:  Clear to auscultation bilaterally HEART:  RRR.  PMI not displaced or sustained,S1 and S2 within normal limits, no S3, no S4, no clicks, no rubs, no murmurs ABD:  Flat, positive bowel sounds normal in frequency in pitch, no bruits, no rebound, no guarding, no midline pulsatile mass, no hepatomegaly, no splenomegaly EXT:  2 plus pulses throughout, no edema, no cyanosis no clubbing SKIN:  No rashes no nodules NEURO:  Cranial nerves II through XII grossly intact, motor grossly intact throughout PSYCH:  Cognitively intact, oriented to person place and time    EKG:  EKG is ordered today. The ekg ordered today demonstrates sinus  rhythm.  Rate 76 bpm    Recent Labs: No results found for requested labs within last 8760 hours.   05/16/16: Total cholesterol 135, triglycerides 228, HDL 41, LDL 48 Hemoglobin Z6X 5.9% TSH 0.42 Sodium 141, potassium 4.2, BUN 16, creatinine 1.05 AST 15, ALT 10   Lipid Panel No results found for: CHOL, TRIG, HDL, CHOLHDL, VLDL, LDLCALC, LDLDIRECT    Wt Readings from Last 3 Encounters:  06/01/16 82.6 kg (182 lb)  11/23/15 84.4 kg (186 lb)  06/25/15 86.2 kg (190 lb)      ASSESSMENT AND PLAN:  # Chest pain: Symptoms are atypical and seem more related to gastroesophageal reflux disease. However, given her family history and the fact that Linda Lucas does not exert herself much, it is difficult to rule out ischemia. We will obtain a Lexiscan Myoview to better evaluate.  Continue aspirin.   # Hyperlipidemia:  LDL is 48. Continue atorvastatin.  # Hypertension:  Repeat BP 124/70.  Linda Lucas takes nadolol for migraines, rather than hypertension. Continue lisinopril.     Current medicines are reviewed at length with the patient today.  The patient does not have concerns regarding medicines.  The following changes have been made:  no change  Labs/ tests ordered today include:   Orders Placed This Encounter  Procedures  . Myocardial Perfusion Imaging  . EKG 12-Lead     Disposition:   FU with Georga Stys C. Duke Salvia, MD, [facility] in 1 month    This note was written with the assistance of speech recognition software.  Please excuse any transcriptional errors.  Signed, Nahima Ales C. Duke Salviaandolph, MD, [facility]  06/01/2016 4:55 PM    [facility]

## 2016-06-14 ENCOUNTER — Telehealth (HOSPITAL_COMMUNITY): Payer: Self-pay

## 2016-06-14 NOTE — Telephone Encounter (Signed)
Encounter complete. 

## 2016-06-16 ENCOUNTER — Ambulatory Visit (HOSPITAL_COMMUNITY)
Admission: RE | Admit: 2016-06-16 | Discharge: 2016-06-16 | Disposition: A | Discharge status: Home / Self Care | Payer: Medicare Other | Source: Ambulatory Visit | Attending: Cardiology | Admitting: Cardiology

## 2016-06-16 DIAGNOSIS — R079 Chest pain, unspecified: Secondary | ICD-10-CM | POA: Diagnosis not present

## 2016-06-16 LAB — MYOCARDIAL PERFUSION IMAGING
CHL CUP RESTING HR STRESS: 68 {beats}/min
CSEPPHR: 96 {beats}/min
LV dias vol: 76 mL (ref 46–106)
LV sys vol: 34 mL
SDS: 0
SRS: 3
SSS: 3
TID: 1.08

## 2016-06-16 MED ORDER — TECHNETIUM TC 99M TETROFOSMIN IV KIT
10.8000 | PACK | Freq: Once | INTRAVENOUS | Status: AC | PRN
Start: 2016-06-16 — End: 2016-06-16
  Administered 2016-06-16: 10.8 via INTRAVENOUS
  Filled 2016-06-16: qty 11

## 2016-06-16 MED ORDER — TECHNETIUM TC 99M TETROFOSMIN IV KIT
32.0000 | PACK | Freq: Once | INTRAVENOUS | Status: AC | PRN
  Administered 2016-06-16: 32 via INTRAVENOUS
  Filled 2016-06-16: qty 32

## 2016-06-16 MED ORDER — REGADENOSON 0.4 MG/5ML IV SOLN
0.4000 mg | Freq: Once | INTRAVENOUS | Status: AC
  Administered 2016-06-16: 0.4 mg via INTRAVENOUS

## 2016-06-28 ENCOUNTER — Ambulatory Visit (INDEPENDENT_AMBULATORY_CARE_PROVIDER_SITE_OTHER): Payer: Medicare Other | Admitting: Gynecology

## 2016-06-28 ENCOUNTER — Encounter: Payer: Self-pay | Admitting: Gynecology

## 2016-06-28 VITALS — BP 130/80 | Ht 62.5 in | Wt 176.0 lb

## 2016-06-28 DIAGNOSIS — Z79890 Hormone replacement therapy (postmenopausal): Secondary | ICD-10-CM

## 2016-06-28 DIAGNOSIS — Z01411 Encounter for gynecological examination (general) (routine) with abnormal findings: Secondary | ICD-10-CM

## 2016-06-28 DIAGNOSIS — Z780 Asymptomatic menopausal state: Secondary | ICD-10-CM | POA: Diagnosis not present

## 2016-06-28 DIAGNOSIS — N952 Postmenopausal atrophic vaginitis: Secondary | ICD-10-CM | POA: Diagnosis not present

## 2016-06-28 MED ORDER — ESTROGENS, CONJUGATED 0.625 MG/GM VA CREA: 1.0000 | TOPICAL_CREAM | VAGINAL | 4 refills | Status: DC

## 2016-06-28 NOTE — Patient Instructions (Addendum)
Followup for bone density as scheduled. 

## 2016-06-28 NOTE — Progress Notes (Signed)
    Linda Lucas [birth year] [id number]        71 y.o.  G2P2001 for annual exam.    Past medical history,surgical history, problem list, medications, allergies, family history and social history were all reviewed and documented as reviewed in the EPIC chart.  ROS:  Performed with pertinent positives and negatives included in the history, assessment and plan.   Additional significant findings :  None   Exam: Kennon Portela assistant Vitals:   06/28/16 1440  BP: 130/80  Weight: 176 lb (79.8 kg)  Height: 5' 2.5" (1.588 m)   Body mass index is 31.68 kg/m.  General appearance:  Normal affect, orientation and appearance. Skin: Grossly normal HEENT: Without gross lesions.  No cervical or supraclavicular adenopathy. Thyroid normal.  Lungs:  Clear without wheezing, rales or rhonchi Cardiac: RR, without RMG Abdominal:  Soft, nontender, without masses, guarding, rebound, organomegaly or hernia Breasts:  Examined lying and sitting without masses, retractions, discharge or axillary adenopathy. Pelvic:  Ext, BUS, Vagina: With atrophic changes  Adnexa: Without masses or tenderness    Anus and perineum: Normal   Rectovaginal: Normal sphincter tone without palpated masses or tenderness.    Assessment/Plan:  71 y.o. G30P2001 female for annual exam.   1. Postmenopausal/atrophic genital changes. History of TAH in the past. Had been on estradiol 1 mg but decreased to 0.5 mg and is doing well on this and wants to continue. I reviewed the current literature to include the 2017 NAMS guidelines with benefits of symptom relief and possible bone health/cardiovascular when started early versus risks to include stroke heart attack DVT and the breast cancer issue. Patient strongly wants to continue and she has a supply of the estradiol 1 mg at home that she is breaking in half. She'll call when she needs a new prescription for the 0.5 mg tablets. Also using Premarin vaginal cream twice weekly for vaginal support  which is doing well and I refilled this 1 year to her mail order pharmacy. 2. Pap smear 2009. No Pap smear done today. No history of significant abnormal Pap smears. We both agree to stop screening per current screening guidelines based on hysterectomy and age. 3. Colonoscopy 2015. Repeat at their recommended interval. 4. DEXA 2013 normal. Schedule DEXA now at 5 year interval and she agrees to arrange. 5. Mammography 07/2015. I reminded patient she is coming due she's going to call and schedule. SBE monthly reviewed. 6. Health maintenance. No routine lab work done as patient does this elsewhere. Follow up 1 year, sooner as needed.  Dara Lords MD, 3:21 PM 06/28/2016

## 2016-07-04 ENCOUNTER — Other Ambulatory Visit: Payer: Self-pay | Admitting: Gynecology

## 2016-07-04 DIAGNOSIS — Z1231 Encounter for screening mammogram for malignant neoplasm of breast: Secondary | ICD-10-CM

## 2016-07-06 ENCOUNTER — Ambulatory Visit: Payer: Medicare Other | Admitting: Cardiovascular Disease

## 2016-07-19 ENCOUNTER — Ambulatory Visit: Payer: Medicare Other | Admitting: Cardiovascular Disease

## 2016-07-25 ENCOUNTER — Ambulatory Visit
Admission: RE | Admit: 2016-07-25 | Discharge: 2016-07-25 | Disposition: A | Discharge status: Home / Self Care | Payer: Medicare Other | Source: Ambulatory Visit | Attending: Gynecology | Admitting: Gynecology

## 2016-07-25 DIAGNOSIS — Z1231 Encounter for screening mammogram for malignant neoplasm of breast: Secondary | ICD-10-CM

## 2017-01-27 DIAGNOSIS — M79645 Pain in left finger(s): Secondary | ICD-10-CM | POA: Insufficient documentation

## 2017-01-30 ENCOUNTER — Encounter (HOSPITAL_BASED_OUTPATIENT_CLINIC_OR_DEPARTMENT_OTHER): Payer: Self-pay | Admitting: *Deleted

## 2017-01-30 ENCOUNTER — Other Ambulatory Visit: Payer: Self-pay | Admitting: Orthopedic Surgery

## 2017-01-30 DIAGNOSIS — S63279A Dislocation of unspecified interphalangeal joint of unspecified finger, initial encounter: Secondary | ICD-10-CM | POA: Insufficient documentation

## 2017-01-31 ENCOUNTER — Ambulatory Visit (HOSPITAL_BASED_OUTPATIENT_CLINIC_OR_DEPARTMENT_OTHER): Payer: Medicare Other | Admitting: Anesthesiology

## 2017-01-31 ENCOUNTER — Other Ambulatory Visit: Payer: Self-pay

## 2017-01-31 ENCOUNTER — Ambulatory Visit (HOSPITAL_BASED_OUTPATIENT_CLINIC_OR_DEPARTMENT_OTHER)
Admission: RE | Admit: 2017-01-31 | Discharge: 2017-01-31 | Disposition: A | Discharge status: Home / Self Care | Payer: Medicare Other | Source: Ambulatory Visit | Attending: Orthopedic Surgery | Admitting: Orthopedic Surgery

## 2017-01-31 ENCOUNTER — Encounter (HOSPITAL_BASED_OUTPATIENT_CLINIC_OR_DEPARTMENT_OTHER)
Admission: RE | Disposition: A | Discharge status: Home / Self Care | Payer: Self-pay | Source: Ambulatory Visit | Attending: Orthopedic Surgery

## 2017-01-31 ENCOUNTER — Encounter (HOSPITAL_BASED_OUTPATIENT_CLINIC_OR_DEPARTMENT_OTHER): Payer: Self-pay

## 2017-01-31 DIAGNOSIS — E119 Type 2 diabetes mellitus without complications: Secondary | ICD-10-CM | POA: Diagnosis not present

## 2017-01-31 DIAGNOSIS — H409 Unspecified glaucoma: Secondary | ICD-10-CM | POA: Insufficient documentation

## 2017-01-31 DIAGNOSIS — M797 Fibromyalgia: Secondary | ICD-10-CM | POA: Insufficient documentation

## 2017-01-31 DIAGNOSIS — F329 Major depressive disorder, single episode, unspecified: Secondary | ICD-10-CM | POA: Diagnosis not present

## 2017-01-31 DIAGNOSIS — W19XXXA Unspecified fall, initial encounter: Secondary | ICD-10-CM | POA: Insufficient documentation

## 2017-01-31 DIAGNOSIS — K589 Irritable bowel syndrome without diarrhea: Secondary | ICD-10-CM | POA: Insufficient documentation

## 2017-01-31 DIAGNOSIS — Y92002 Bathroom of unspecified non-institutional (private) residence single-family (private) house as the place of occurrence of the external cause: Secondary | ICD-10-CM | POA: Insufficient documentation

## 2017-01-31 DIAGNOSIS — Z7982 Long term (current) use of aspirin: Secondary | ICD-10-CM | POA: Diagnosis not present

## 2017-01-31 DIAGNOSIS — F419 Anxiety disorder, unspecified: Secondary | ICD-10-CM | POA: Insufficient documentation

## 2017-01-31 DIAGNOSIS — S63287A Dislocation of proximal interphalangeal joint of left little finger, initial encounter: Secondary | ICD-10-CM | POA: Insufficient documentation

## 2017-01-31 DIAGNOSIS — Z87440 Personal history of urinary (tract) infections: Secondary | ICD-10-CM | POA: Diagnosis not present

## 2017-01-31 DIAGNOSIS — N3281 Overactive bladder: Secondary | ICD-10-CM | POA: Insufficient documentation

## 2017-01-31 DIAGNOSIS — E7800 Pure hypercholesterolemia, unspecified: Secondary | ICD-10-CM | POA: Diagnosis not present

## 2017-01-31 DIAGNOSIS — Z79899 Other long term (current) drug therapy: Secondary | ICD-10-CM | POA: Diagnosis not present

## 2017-01-31 HISTORY — PX: PERCUTANEOUS PINNING: SHX2209

## 2017-01-31 HISTORY — DX: Anxiety disorder, unspecified: F41.9

## 2017-01-31 LAB — GLUCOSE, CAPILLARY: Glucose-Capillary: 125 mg/dL — ABNORMAL HIGH (ref 65–99)

## 2017-01-31 SURGERY — PINNING, EXTREMITY, PERCUTANEOUS
Anesthesia: Monitor Anesthesia Care | Site: Finger | Laterality: Left

## 2017-01-31 MED ORDER — FENTANYL CITRATE (PF) 100 MCG/2ML IJ SOLN
INTRAMUSCULAR | Status: AC
  Filled 2017-01-31: qty 2

## 2017-01-31 MED ORDER — MIDAZOLAM HCL 2 MG/2ML IJ SOLN: 1.0000 mg | INTRAMUSCULAR | Status: DC | PRN

## 2017-01-31 MED ORDER — ONDANSETRON HCL 4 MG/2ML IJ SOLN
INTRAMUSCULAR | Status: DC | PRN
  Administered 2017-01-31: 4 mg via INTRAVENOUS

## 2017-01-31 MED ORDER — CHLORHEXIDINE GLUCONATE 4 % EX LIQD: 60.0000 mL | Freq: Once | CUTANEOUS | Status: DC

## 2017-01-31 MED ORDER — PROPOFOL 10 MG/ML IV BOLUS
INTRAVENOUS | Status: AC
  Filled 2017-01-31: qty 20

## 2017-01-31 MED ORDER — LACTATED RINGERS IV SOLN
INTRAVENOUS | Status: DC
  Administered 2017-01-31: 11:00:00 via INTRAVENOUS

## 2017-01-31 MED ORDER — MIDAZOLAM HCL 2 MG/2ML IJ SOLN
INTRAMUSCULAR | Status: AC
  Filled 2017-01-31: qty 2

## 2017-01-31 MED ORDER — FENTANYL CITRATE (PF) 100 MCG/2ML IJ SOLN
50.0000 ug | INTRAMUSCULAR | Status: DC | PRN
  Administered 2017-01-31: 50 ug via INTRAVENOUS

## 2017-01-31 MED ORDER — BUPIVACAINE HCL (PF) 0.25 % IJ SOLN
INTRAMUSCULAR | Status: DC | PRN
  Administered 2017-01-31: 6 mL

## 2017-01-31 MED ORDER — PROMETHAZINE HCL 25 MG/ML IJ SOLN: 6.2500 mg | INTRAMUSCULAR | Status: DC | PRN

## 2017-01-31 MED ORDER — CEFAZOLIN SODIUM-DEXTROSE 2-4 GM/100ML-% IV SOLN
INTRAVENOUS | Status: AC
  Filled 2017-01-31: qty 100

## 2017-01-31 MED ORDER — HYDROCODONE-ACETAMINOPHEN 5-325 MG PO TABS: 1.0000 | ORAL_TABLET | Freq: Four times a day (QID) | ORAL | 0 refills | Status: DC | PRN

## 2017-01-31 MED ORDER — CEFAZOLIN SODIUM-DEXTROSE 2-4 GM/100ML-% IV SOLN: 2.0000 g | INTRAVENOUS | Status: DC

## 2017-01-31 MED ORDER — SCOPOLAMINE 1 MG/3DAYS TD PT72: 1.0000 | MEDICATED_PATCH | Freq: Once | TRANSDERMAL | Status: DC | PRN

## 2017-01-31 MED ORDER — PROPOFOL 10 MG/ML IV BOLUS
INTRAVENOUS | Status: DC | PRN
  Administered 2017-01-31: 120 mg via INTRAVENOUS

## 2017-01-31 MED ORDER — FENTANYL CITRATE (PF) 100 MCG/2ML IJ SOLN: 25.0000 ug | INTRAMUSCULAR | Status: DC | PRN

## 2017-01-31 MED ORDER — DEXAMETHASONE SODIUM PHOSPHATE 10 MG/ML IJ SOLN
INTRAMUSCULAR | Status: DC | PRN
  Administered 2017-01-31: 4 mg via INTRAVENOUS

## 2017-01-31 MED ORDER — LIDOCAINE HCL (CARDIAC) 20 MG/ML IV SOLN
INTRAVENOUS | Status: DC | PRN
  Administered 2017-01-31: 50 mg via INTRAVENOUS

## 2017-01-31 SURGICAL SUPPLY — 52 items
BLADE MINI RND TIP GREEN BEAV (BLADE) IMPLANT
BLADE SURG 15 STRL LF DISP TIS (BLADE) ×2 IMPLANT
BLADE SURG 15 STRL SS (BLADE) ×3
BNDG CMPR 9X4 STRL LF SNTH (GAUZE/BANDAGES/DRESSINGS) ×2
BNDG COHESIVE 3X5 TAN STRL LF (GAUZE/BANDAGES/DRESSINGS) ×3 IMPLANT
BNDG ESMARK 4X9 LF (GAUZE/BANDAGES/DRESSINGS) ×3 IMPLANT
BNDG GAUZE ELAST 4 BULKY (GAUZE/BANDAGES/DRESSINGS) ×3 IMPLANT
CHLORAPREP W/TINT 26ML (MISCELLANEOUS) ×3 IMPLANT
CORD BIPOLAR FORCEPS 12FT (ELECTRODE) ×3 IMPLANT
COVER BACK TABLE 60X90IN (DRAPES) ×3 IMPLANT
COVER MAYO STAND STRL (DRAPES) ×3 IMPLANT
CUFF TOURNIQUET SINGLE 18IN (TOURNIQUET CUFF) ×2 IMPLANT
DECANTER SPIKE VIAL GLASS SM (MISCELLANEOUS) IMPLANT
DRAPE EXTREMITY T 121X128X90 (DRAPE) ×3 IMPLANT
DRAPE OEC MINIVIEW 54X84 (DRAPES) ×3 IMPLANT
DRAPE SURG 17X23 STRL (DRAPES) ×3 IMPLANT
GAUZE SPONGE 4X4 12PLY STRL (GAUZE/BANDAGES/DRESSINGS) ×3 IMPLANT
GAUZE SPONGE 4X4 16PLY XRAY LF (GAUZE/BANDAGES/DRESSINGS) IMPLANT
GAUZE XEROFORM 1X8 LF (GAUZE/BANDAGES/DRESSINGS) ×3 IMPLANT
GLOVE BIOGEL PI IND STRL 6.5 (GLOVE) ×1 IMPLANT
GLOVE BIOGEL PI IND STRL 7.0 (GLOVE) ×2 IMPLANT
GLOVE BIOGEL PI IND STRL 8 (GLOVE) ×2 IMPLANT
GLOVE BIOGEL PI IND STRL 8.5 (GLOVE) ×2 IMPLANT
GLOVE BIOGEL PI INDICATOR 6.5 (GLOVE) ×1
GLOVE BIOGEL PI INDICATOR 7.0 (GLOVE) ×2
GLOVE BIOGEL PI INDICATOR 8 (GLOVE) ×1
GLOVE BIOGEL PI INDICATOR 8.5 (GLOVE) ×1
GLOVE SURG SS PI 8.0 STRL IVOR (GLOVE) ×3 IMPLANT
GOWN STRL REUS W/ TWL LRG LVL3 (GOWN DISPOSABLE) ×2 IMPLANT
GOWN STRL REUS W/TWL LRG LVL3 (GOWN DISPOSABLE) ×3
GOWN STRL REUS W/TWL XL LVL3 (GOWN DISPOSABLE) ×3 IMPLANT
K-WIRE .035X4 (WIRE) ×2 IMPLANT
NDL PRECISIONGLIDE 27X1.5 (NEEDLE) IMPLANT
NEEDLE PRECISIONGLIDE 27X1.5 (NEEDLE) ×3 IMPLANT
NS IRRIG 1000ML POUR BTL (IV SOLUTION) ×3 IMPLANT
PACK BASIN DAY SURGERY FS (CUSTOM PROCEDURE TRAY) ×3 IMPLANT
PAD CAST 3X4 CTTN HI CHSV (CAST SUPPLIES) ×2 IMPLANT
PADDING CAST ABS 4INX4YD NS (CAST SUPPLIES) ×1
PADDING CAST ABS COTTON 4X4 ST (CAST SUPPLIES) ×2 IMPLANT
PADDING CAST COTTON 3X4 STRL (CAST SUPPLIES) ×3
SLEEVE SCD COMPRESS KNEE MED (MISCELLANEOUS) ×2 IMPLANT
SPLINT PLASTER CAST XFAST 3X15 (CAST SUPPLIES) IMPLANT
SPLINT PLASTER XTRA FASTSET 3X (CAST SUPPLIES)
STOCKINETTE 4X48 STRL (DRAPES) ×3 IMPLANT
SUT CHROMIC 5 0 P 3 (SUTURE) IMPLANT
SUT ETHILON 4 0 PS 2 18 (SUTURE) ×1 IMPLANT
SUT MERSILENE 4 0 P 3 (SUTURE) IMPLANT
SUT VICRYL 4-0 PS2 18IN ABS (SUTURE) IMPLANT
SYR BULB 3OZ (MISCELLANEOUS) ×3 IMPLANT
SYR CONTROL 10ML LL (SYRINGE) ×3 IMPLANT
TOWEL OR 17X24 6PK STRL BLUE (TOWEL DISPOSABLE) ×3 IMPLANT
UNDERPAD 30X30 (UNDERPADS AND DIAPERS) ×3 IMPLANT

## 2017-01-31 NOTE — Transfer of Care (Signed)
Immediate Anesthesia Transfer of Care Note  Patient: Linda Lucas  Procedure(s) Performed: PERCUTANEOUS PINNING EXTREMITYLEFT SMALL FINGER (Left Finger)  Patient Location: PACU  Anesthesia Type:General  Level of Consciousness: awake, alert , oriented and patient cooperative  Airway & Oxygen Therapy: Patient Spontanous Breathing and Patient connected to face mask oxygen  Post-op Assessment: Report given to RN and Post -op Vital signs reviewed and stable  Post vital signs: Reviewed and stable  Last Vitals:  Vitals:   01/31/17 1044  BP: (!) 138/38  Pulse: 89  Resp: 18  Temp: 36.9 C  SpO2: 100%    Last Pain:  Vitals:   01/31/17 1044  TempSrc: Oral  PainSc: 3       Patients Stated Pain Goal: 3 (01/31/17 1044)  Complications: No apparent anesthesia complications

## 2017-01-31 NOTE — Op Note (Signed)
Dictation Number [id number]

## 2017-01-31 NOTE — Brief Op Note (Signed)
01/31/2017  11:59 AM  PATIENT:  Zannie KehrJudith A Godette  71 y.o. female  PRE-OPERATIVE DIAGNOSIS:  DISLOCATION PROXIMAL INTERPHALANGEAL JOINT LEFT SMALL  POST-OPERATIVE DIAGNOSIS:  DISLOCATION PROXIMAL INTERPHALANGEAL JOINT LEFT SMALL  PROCEDURE:  Procedure(s): PERCUTANEOUS PINNING EXTREMITYLEFT SMALL FINGER (Left)  SURGEON:  Surgeon(s) and Role:    * Cindee SaltKuzma, Eashan Schipani, MD - Primary  PHYSICIAN ASSISTANT:   ASSISTANTS: none   ANESTHESIA:   local and general  EBL: 1cc  BLOOD ADMINISTERED:none  DRAINS: none   LOCAL MEDICATIONS USED:  BUPIVICAINE   SPECIMEN:  No Specimen  DISPOSITION OF SPECIMEN:  N/A  COUNTS:  YES  TOURNIQUET:  * Missing tourniquet times found for documented tourniquets in log: [id number] *  DICTATION: .Other Dictation: Dictation Number [id number]  PLAN OF CARE: Discharge to home after PACU  PATIENT DISPOSITION:  PACU - hemodynamically stable.

## 2017-01-31 NOTE — H&P (Signed)
Linda Lucas is an 71 y.o. female.   Chief Complaint: dislocationPIP left small finger  HPI: Linda Lucas  is [id number] right-handed she sustained a fall approximately 3 weeks ago and bathroom. She complains of swelling of the left small finger PIP joint for 2 days with pain which then resolved she taped her finger to her ring finger. She is not complaining of any significant pain at the present time but is unable to bend it. She had a prior fall a month before that to this same finger with an injury. She does not recall it being quite as bad. Has had no other treatment. For this. She has a history of diabetes thyroid problems arthritis no history of gout. Family history is positive diabetes negative for thyroid problems arthritis and gout. The finger is blocked and given 1/4% bupivacaine 1% Xylocaine injection digital block and manipulated into flexion. She is trying to hold this in extension at all time. I was able to fully flex her finger down into full flexion. A dorsal splint is placed. Despite it being angulated approximately 45-50 degrees her finger is reduced in a fully extended position. X-ray AP and lateral with her splint on reveal re-subluxation dorsally of the finger.             Past Medical History:  Diagnosis Date  . Ankle fracture    bimeolar fracture  . Anxiety   . Chronic urethritis    frequent uti's and or urethritis  . Degenerative disc disease, lumbar 2007   bulging disc  . Depression   . Diabetes mellitus 2004  . Fibromyalgia   . Glaucoma   . Hypercholesteremia   . IBS (irritable bowel syndrome)   . 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 Migraine   . Osteoarthritis   . Overactive bladder   . Prolactin increased (HCC) 05/2002   NORMAL MRI    Past Surgical History:  Procedure Laterality Date  . ABDOMINAL  HYSTERECTOMY    . APPENDECTOMY  age 71  . BREAST BIOPSY     x 2  . BREAST EXCISIONAL BIOPSY Left 1994  . BREAST EXCISIONAL BIOPSY Left 1994  . BURCH PROCEDURE    . CARPECTOMY HAND     LEFT FOR KEINBACH'S DZ  . CATARACT EXTRACTION    . CHOLECYSTECTOMY OPEN     WITH CDE  . CYSTOSCOPY     X 4  . FRACTURE METARSAL    . FUSION OF DISCS     C 3-4 4-5 5-6 VERTBRAES IN NECK DUE TO DEGEN. DISC DZ  . FUSION OF LEFT WRIST WITH PLATE AND SCREWS  2009   CADAVER BONE GRAFT AND APPLICATION OF HUMAN GROWTH FACTORS  . HEMORROIDECTOMY  age 71  . HYSTEROSCOPY     D&C,HYSTEROSCOPY FOR PED FIBROID   . INTERSTIM IMPLANT PLACEMENT    . ORIF RADIAL HEAD / NECK FRACTURE  2010   RIGHT RADIAL HED EXTENDING INTO JOINT SPACE WITH PLATE AND SCREWS  . RIGHT ANKLE FRACTURE    . ROTATOR CUFF REPAIR    . TONSILLECTOMY AND ADENOIDECTOMY    . TUBAL LIGATION      Family History  Problem Relation Age of Onset  . Other Daughter        needle stick at hospital died of AIDS and hepatiitis  . Diabetes  Mother   . Hypertension Mother   . Heart disease Mother   . Heart attack Mother   . Diabetes Father   . Heart disease Father   . Hypertension Father   . Rheumatic fever Father   . Diabetes Sister   . Osteoarthritis Sister   . Heart attack Sister   . Diabetes Brother   . Heart attack Brother   . Cancer Maternal Grandmother        OVAR[facility] OR [place]  . Other Son        paralyzed after motorcycle accident   Social History:  reports that  has never smoked. she has never used smokeless tobacco. She reports that she does not drink alcohol or use drugs.  Allergies:  Allergies  Allergen Reactions  . Aspirin     "IN LARGE DOSES"  . Latex   . Oxycodone-Acetaminophen Nausea And Vomiting  . Oxymorphone Hcl Nausea And Vomiting  . Ivp Dye [Iodinated Diagnostic Agents] Rash    No medications prior to admission.    No results found for this or any previous visit (from the past 48 hour(s)).  No results  found.   Pertinent items are noted in HPI.  Height 5' 2.5" (1.588 m), weight 86.2 kg (190 lb).  General appearance: alert, cooperative and appears stated age Head: Normocephalic, without obvious abnormality Neck: no JVD Resp: clear to auscultation bilaterally Cardio: regular rate and rhythm, S1, S2 normal, no murmur, click, rub or gallop GI: soft, non-tender; bowel sounds normal; no masses,  no organomegaly Extremities: deformity left small finger PIP joint Pulses: 2+ and symmetric Skin: Skin color, texture, turgor normal. No rashes or lesions Neurologic: Grossly normal Incision/Wound: na  Assessment/Plan Assessment:  1. Dislocation of interphalangeal joint of finger, initial encounter ORTHO XR FINGER 2 VW UNILATERAL Left; AP, Lateral    Plan: Plan we would recommend close percutaneous extension block splinting with possible open reduction internal fixation PIP joint left small finger. This will be scheduled as an outpatient. We have advised her that we will attempt a do this in a closed manner to avoid significant chance of stiffness and arthritic change with open reduction and fixation. She is advised that there is no guarantee to surgery possibility of infection recurrence injury to arteries nerves or tendons this scheduled as an outpatient under regional anesthesia tomorrow.       Linda Lucas R 01/31/2017, 9:27 AM

## 2017-01-31 NOTE — Anesthesia Procedure Notes (Signed)
Procedure Name: LMA Insertion Date/Time: 01/31/2017 11:40 AM Performed by: Ronnette HilaPayne, Panhia Karl D, CRNA Pre-anesthesia Checklist: Patient identified, Emergency Drugs available, Suction available and Patient being monitored Patient Re-evaluated:Patient Re-evaluated prior to induction Oxygen Delivery Method: Circle system utilized Preoxygenation: Pre-oxygenation with 100% oxygen Induction Type: IV induction Ventilation: Mask ventilation without difficulty LMA: LMA inserted LMA Size: 4.0 Number of attempts: 1 Airway Equipment and Method: Bite block Placement Confirmation: positive ETCO2 Tube secured with: Tape Dental Injury: Teeth and Oropharynx as per pre-operative assessment

## 2017-01-31 NOTE — Anesthesia Postprocedure Evaluation (Signed)
Anesthesia Post Note  Patient: Linda KehrJudith A Lucas  Procedure(s) Performed: PERCUTANEOUS PINNING EXTREMITYLEFT SMALL FINGER (Left Finger)     Patient location during evaluation: PACU Anesthesia Type: General Level of consciousness: awake and alert Pain management: pain level controlled Vital Signs Assessment: post-procedure vital signs reviewed and stable Respiratory status: spontaneous breathing, nonlabored ventilation, respiratory function stable and patient connected to nasal cannula oxygen Cardiovascular status: blood pressure returned to baseline and stable Postop Assessment: no apparent nausea or vomiting Anesthetic complications: no    Last Vitals:  Vitals:   01/31/17 1230 01/31/17 1251  BP: (!) 121/54 (!) 130/54  Pulse: 86 88  Resp: 15 16  Temp:  36.6 C  SpO2: 99% 100%    Last Pain:  Vitals:   01/31/17 1251  TempSrc:   PainSc: 0-No pain                 Oberon Hehir S

## 2017-01-31 NOTE — Anesthesia Preprocedure Evaluation (Addendum)
Anesthesia Evaluation  Patient identified by MRN, date of birth, ID band Patient awake    Reviewed: Allergy & Precautions, NPO status , Patient's Chart, lab work & pertinent test results  Airway Mallampati: II  TM Distance: >3 FB Neck ROM: Full    Dental no notable dental hx.    Pulmonary neg pulmonary ROS,    Pulmonary exam normal breath sounds clear to auscultation       Cardiovascular negative cardio ROS Normal cardiovascular exam Rhythm:Regular Rate:Normal     Neuro/Psych negative neurological ROS  negative psych ROS   GI/Hepatic negative GI ROS, Neg liver ROS,   Endo/Other  diabetes  Renal/GU negative Renal ROS  negative genitourinary   Musculoskeletal negative musculoskeletal ROS (+)   Abdominal   Peds negative pediatric ROS (+)  Hematology negative hematology ROS (+)   Anesthesia Other Findings   Reproductive/Obstetrics negative OB ROS                             Anesthesia Physical Anesthesia Plan  ASA: II  Anesthesia Plan: General and Bier Block-LIDOCAINE ONLY   Post-op Pain Management:    Induction: Intravenous  PONV Risk Score and Plan: 3 and Ondansetron, Dexamethasone and Treatment may vary due to age or medical condition  Airway Management Planned: LMA  Additional Equipment:   Intra-op Plan:   Post-operative Plan:   Informed Consent: I have reviewed the patients History and Physical, chart, labs and discussed the procedure including the risks, benefits and alternatives for the proposed anesthesia with the patient or authorized representative who has indicated his/her understanding and acceptance.   Dental advisory given  Plan Discussed with: CRNA and Surgeon  Anesthesia Plan Comments:        Anesthesia Quick Evaluation

## 2017-01-31 NOTE — Discharge Instructions (Addendum)

## 2017-02-01 ENCOUNTER — Encounter (HOSPITAL_BASED_OUTPATIENT_CLINIC_OR_DEPARTMENT_OTHER): Payer: Self-pay | Admitting: Orthopedic Surgery

## 2017-02-01 NOTE — Op Note (Signed)
NAMMarlynn Lucas:  Nabers, [name]                 ACCOUNT NO.:  [id number]  MEDICAL RECORD NO.:  [id number]  LOCATION:                                 FACILITY:  PHYSICIAN:  Cindee SaltGary Tianah Lonardo, M.D.            DATE OF BIRTH:  DATE OF PROCEDURE:  01/31/2017 DATE OF DISCHARGE:                              OPERATIVE REPORT   [facility] SUR[facility]ERY[facility].  PREOPERATIVE DIAGNOSIS:  Dislocation proximal interphalangeal joint, left small finger.  POSTOPERATIVE DIAGNOSIS:  Dislocation proximal interphalangeal joint, left small finger.  OPERATION:  Closed reduction, percutaneous locking pin, proximal interphalangeal joint, left small finger.  SURGEON:  Cindee SaltGary Deeandra Jerry, M.D.  ANESTHESIA:  General.  ANESTHESIOLOGIST:  Hezzie BumpGeorge S. Okey Dupreose, M.D.  HISTORY:  The patient is a 71 year old female, who sustained a dislocation to the PIP joint of her left small finger.  She did not seek medical attention for 3 weeks.  She was seen in my office 4 days ago, where a reduction and application of a splint was applied.  She returns now with the finger fully straight, now re-subluxated.  She was admitted for pinning of the PIP joint in flexion with a blocking pin in the proximal phalanx.  Pre, peri, and postoperative course have been discussed along with risks and complications.  She is aware that there is no guarantee to the surgery; possibility of infection; recurrence of injury to arteries, nerves, tendons; incomplete relief of symptoms; dystrophy; possibility of stiffness; possibility of further surgery being necessitated should it re-dislocate following closed treatment. In the preoperative area, the patient was seen, the extremity marked by both patient and surgeon.  Antibiotic given.  PROCEDURE IN DETAIL:  The patient was brought to the operating room, where a general anesthetic was carried out without difficulty, in that they could not find more than 1 vein for access.  She was prepped using ChloraPrep in  supine position with the left arm free.  A 3-minute dry time was allowed.  Time-out taken, confirming the patient and procedure. The finger was manipulated into flexion.  X-rays confirmed that it was reduced in both AP and lateral direction.  A 3.5 K-wire was then inserted, holding the PIP joint in approximately 40 degrees of flexion. X-rays taken revealed that the pin was slightly ulnarly located and a more central pin was placed.  This was placed at the entire length of the proximal phalanx.  Held the finger PIP joint at approximately 40 degrees of flexion.  This was bent cut short.  A sterile compressive dressing to the finger and splint was applied including both dorsal and palmar aspects after metacarpal block was given with 0.25% bupivacaine without epinephrine, 8 mL being used. The patient tolerated the procedure well, was awakened, and taken to the recovery room for observation in satisfactory condition.  Tourniquet was not used during the procedure.  She will be discharged home [facility] on [place].          ______________________________ Cindee SaltGary Kamika Goodloe, M.D.     GK/MEDQ  D:  01/31/2017  T:  02/01/2017  Job:  [id number] 

## 2017-02-13 ENCOUNTER — Ambulatory Visit (INDEPENDENT_AMBULATORY_CARE_PROVIDER_SITE_OTHER): Payer: Medicare Other

## 2017-02-13 ENCOUNTER — Other Ambulatory Visit: Payer: Self-pay | Admitting: Gynecology

## 2017-02-13 DIAGNOSIS — Z780 Asymptomatic menopausal state: Secondary | ICD-10-CM

## 2017-02-14 ENCOUNTER — Encounter: Payer: Self-pay | Admitting: Gynecology

## 2017-02-15 DIAGNOSIS — F419 Anxiety disorder, unspecified: Secondary | ICD-10-CM | POA: Insufficient documentation

## 2017-03-06 DIAGNOSIS — M25642 Stiffness of left hand, not elsewhere classified: Secondary | ICD-10-CM | POA: Insufficient documentation

## 2017-03-08 ENCOUNTER — Other Ambulatory Visit: Payer: Self-pay | Admitting: *Deleted

## 2017-03-08 MED ORDER — ESTRADIOL 0.5 MG PO TABS: 0.5000 mg | ORAL_TABLET | Freq: Every day | ORAL | 0 refills | Status: DC

## 2017-03-08 NOTE — Telephone Encounter (Signed)
Per note on 06/18/16 "She'll call when she needs a new prescription for the 0.5 mg tablets. : Rx for estradiol 0.5 mg sent.

## 2017-04-18 ENCOUNTER — Other Ambulatory Visit: Payer: Self-pay | Admitting: Gynecology

## 2017-06-05 ENCOUNTER — Other Ambulatory Visit: Payer: Self-pay | Admitting: Gynecology

## 2017-06-15 ENCOUNTER — Other Ambulatory Visit: Payer: Self-pay | Admitting: Gynecology

## 2017-06-15 DIAGNOSIS — Z1231 Encounter for screening mammogram for malignant neoplasm of breast: Secondary | ICD-10-CM

## 2017-06-27 ENCOUNTER — Encounter (INDEPENDENT_AMBULATORY_CARE_PROVIDER_SITE_OTHER): Payer: Self-pay | Admitting: Physical Medicine and Rehabilitation

## 2017-06-27 ENCOUNTER — Ambulatory Visit (INDEPENDENT_AMBULATORY_CARE_PROVIDER_SITE_OTHER): Payer: Medicare Other | Admitting: Physical Medicine and Rehabilitation

## 2017-06-27 VITALS — BP 143/73 | HR 84 | Temp 98.0°F

## 2017-06-27 DIAGNOSIS — G894 Chronic pain syndrome: Secondary | ICD-10-CM | POA: Diagnosis not present

## 2017-06-27 DIAGNOSIS — M5116 Intervertebral disc disorders with radiculopathy, lumbar region: Secondary | ICD-10-CM | POA: Diagnosis not present

## 2017-06-27 DIAGNOSIS — M797 Fibromyalgia: Secondary | ICD-10-CM

## 2017-06-27 DIAGNOSIS — M545 Low back pain: Secondary | ICD-10-CM | POA: Diagnosis not present

## 2017-06-27 DIAGNOSIS — G8929 Other chronic pain: Secondary | ICD-10-CM | POA: Diagnosis not present

## 2017-06-27 NOTE — Progress Notes (Signed)
 .  Numeric Pain Rating Scale and Functional Assessment Average Pain 8 Pain Right Now 5 My pain is intermittent and aching Pain is worse with: walking, standing and some activites Pain improves with: rest   In the last MONTH (on 0-10 scale) has pain interfered with the following?  1. General activity like being  able to carry out your everyday physical activities such as walking, climbing stairs, carrying groceries, or moving a chair?  Rating(7)  2. Relation with others like being able to carry out your usual social activities and roles such as  activities at home, at work and in your community. Rating(4)  3. Enjoyment of life such that you have  been bothered by emotional problems such as feeling anxious, depressed or irritable?  Rating(0)

## 2017-07-04 ENCOUNTER — Encounter (INDEPENDENT_AMBULATORY_CARE_PROVIDER_SITE_OTHER): Payer: Medicare Other | Admitting: Physical Medicine and Rehabilitation

## 2017-07-05 ENCOUNTER — Other Ambulatory Visit: Payer: Medicare Other

## 2017-07-07 ENCOUNTER — Ambulatory Visit
Admission: RE | Admit: 2017-07-07 | Discharge: 2017-07-07 | Disposition: A | Discharge status: Home / Self Care | Payer: Medicare Other | Source: Ambulatory Visit | Attending: Physical Medicine and Rehabilitation | Admitting: Physical Medicine and Rehabilitation

## 2017-07-07 DIAGNOSIS — M545 Low back pain: Secondary | ICD-10-CM

## 2017-07-07 DIAGNOSIS — M5116 Intervertebral disc disorders with radiculopathy, lumbar region: Secondary | ICD-10-CM

## 2017-07-07 DIAGNOSIS — G8929 Other chronic pain: Secondary | ICD-10-CM

## 2017-07-10 ENCOUNTER — Encounter (INDEPENDENT_AMBULATORY_CARE_PROVIDER_SITE_OTHER): Payer: Medicare Other | Admitting: Physical Medicine and Rehabilitation

## 2017-07-19 ENCOUNTER — Encounter (INDEPENDENT_AMBULATORY_CARE_PROVIDER_SITE_OTHER): Payer: Self-pay | Admitting: Physical Medicine and Rehabilitation

## 2017-07-19 ENCOUNTER — Ambulatory Visit (INDEPENDENT_AMBULATORY_CARE_PROVIDER_SITE_OTHER): Payer: Medicare Other | Admitting: Physical Medicine and Rehabilitation

## 2017-07-19 ENCOUNTER — Ambulatory Visit (INDEPENDENT_AMBULATORY_CARE_PROVIDER_SITE_OTHER): Payer: Medicare Other

## 2017-07-19 VITALS — BP 135/75 | HR 85

## 2017-07-19 DIAGNOSIS — M48062 Spinal stenosis, lumbar region with neurogenic claudication: Secondary | ICD-10-CM | POA: Diagnosis not present

## 2017-07-19 DIAGNOSIS — M47816 Spondylosis without myelopathy or radiculopathy, lumbar region: Secondary | ICD-10-CM

## 2017-07-19 DIAGNOSIS — G894 Chronic pain syndrome: Secondary | ICD-10-CM

## 2017-07-19 DIAGNOSIS — M5416 Radiculopathy, lumbar region: Secondary | ICD-10-CM | POA: Diagnosis not present

## 2017-07-19 MED ORDER — METHYLPREDNISOLONE ACETATE 80 MG/ML IJ SUSP
80.0000 mg | Freq: Once | INTRAMUSCULAR | Status: AC
  Administered 2017-07-19: 80 mg

## 2017-07-19 NOTE — Progress Notes (Signed)
 .  Numeric Pain Rating Scale and Functional Assessment Average Pain 3   In the last MONTH (on 0-10 scale) has pain interfered with the following?  1. General activity like being  able to carry out your everyday physical activities such as walking, climbing stairs, carrying groceries, or moving a chair?  Rating(5)   +Driver, -BT, +Dye Allergies (Ivp Dye; breaks out in hives).

## 2017-07-19 NOTE — Patient Instructions (Signed)

## 2017-07-26 ENCOUNTER — Encounter (INDEPENDENT_AMBULATORY_CARE_PROVIDER_SITE_OTHER): Payer: Self-pay | Admitting: Physical Medicine and Rehabilitation

## 2017-07-26 NOTE — Progress Notes (Addendum)
Linda Lucas - 72 y.o. female MRN [id number]  Date of birth: 06/24/1945  Office Visit Note: Visit Date: 06/27/2017 PCP: Lorenda Ishihara, MD Referred by: Lorenda Ishihara,*  Subjective: Chief Complaint  Patient presents with  . Lower Back - Pain  . Left Leg - Pain  . Right Leg - Pain   HPI: Linda Lucas is a very pleasant 72 year old female that I last saw in 2014 and she was followed by Dr. Norlene Campbell for orthopedic complaints in our office.  She has not been in our office in the last several years since 2014.  She has a history of chronic pain syndrome and fibromyalgia as well as at least in 2009 MRI showing broad disc bulging with central protrusion and more right than left paracentral herniation with lateral recess narrowing bilaterally.  We have completed epidural injection on 2 occasions over a couple years with good relief of her symptoms.  She comes in today with pain in the lower back radiating into both legs equally.  She reports this is been ongoing for about 6 months with no specific injury noted.  She says the pain will come and go at times but does seem to get worse with walking and standing and going from a sitting position to standing makes it really worse.  She reports sitting more ease the pain.  She really denies paresthesias or pain down to the feet it does go in the thighs bilaterally more of an L4 and L5 distribution.  She does get some pain in the buttock region.  She has not noted any focal weakness or foot drop.  She has had no fevers or chills or night sweats or unexplained weight loss.  She has had no recent spinal imaging.  She has a complicated chronic pain history significant for migraine headaches, irritable bowel syndrome and pelvic pain as well as fibromyalgia.  Her case is also complicated by major depressive disorder.  She has had multiple pain medications in the past but currently is not taking any.  She had a recent problem with her hand which was  treated by Dr. Merlyn Lot.  She was taking some hydrocodone and hydromorphone at that time.  She was also seen by Dr. Elinor Dodge recently.  Evaluation of her low back pain.  [place]  He did obtain x-rays which we did have reports and those are reviewed below.  She has multiple allergies including adhesive tape and oxycodone and oxymorphone as well as contrast dye.  The contrast dye allergy is a rash.  She has not had an anaphylactic reaction to that.  We did use contrast in 2014 for the injections.  She rates her average pain is an 8 out of 10 with current pain with sitting as a 5.  She does it does affect her daily activities.  She does have an InterStim device for pelvic pain and bladder control.   Review of Systems  Constitutional: Positive for malaise/fatigue. Negative for chills, fever and weight loss.  HENT: Negative for hearing loss and sinus pain.   Eyes: Negative for blurred vision, double vision and photophobia.  Respiratory: Negative for cough and shortness of breath.   Cardiovascular: Negative for chest pain, palpitations and leg swelling.  Gastrointestinal: Negative for abdominal pain, nausea and vomiting.  Genitourinary: Negative for flank pain.  Musculoskeletal: Positive for back pain and joint pain. Negative for myalgias.       Bilateral hip and leg pain  Skin: Negative  for itching and rash.  Neurological: Negative for tingling, tremors, focal weakness and weakness.  Endo/Heme/Allergies: Negative.   Psychiatric/Behavioral: Negative for depression.  All other systems reviewed and are negative.  Otherwise per HPI.  Assessment & Plan: Visit Diagnoses:  1. Radiculopathy due to lumbar intervertebral disc disorder   2. Chronic bilateral low back pain without sciatica   3. Fibromyalgia   4. Chronic pain syndrome     Plan: Findings:  6 months of chronic worsening axial low back pain predominantly with standing and walking with referral  patterns into both legs.  Exam is more consistent with either a facet arthropathy problem or stenosis.  2009 MRI did not show any central stenosis but did show a lateral recess narrowing at L5-S1 with central protrusion on top of bulging.  She did have facet arthropathy at the time.  I did not get x-rays today as she did have a recent x-ray reports through [facility] that we could access..  She cannot have an MRI Duda InterStim device so we will get a CT scan of the lumbar spine.  I do not think it warrants at this point getting a myelogram.  We would consider myelogram depending on results of CT scan.  The next step depending on the imaging would be likely facet joint blocks with a goal towards radiofrequency ablation procedure.  We discussed this at length.  We also discussed epidural injection at length.  From medication standpoint she is fairly complicated with the current medications she does take with psychiatric history as well as chronic pain history and allergies.  This is not a situation where I would consider chronic long-term increasing doses of opioids.  If it looks like last the option she is seeking we would make referral to a comprehensive pain management clinic.  She does not appear to have any other joint issues that are at least pressing at this point.  She can continue to follow-up with Dr. Cleophas Dunker for orthopedic care.    Meds & Orders: No orders of the defined types were placed in this encounter.   Orders Placed This Encounter  Procedures  . CT LUMBAR SPINE WO CONTRAST    Follow-up: Return for CT review after completion.   Procedures: No procedures performed  No notes on file   Clinical History: 06/03/2017 IMAGING:  X-RAYS: AP and lateral view of the lumbar spine. These were taken and  reviewed in the office today.  INTERPRETATION: No acute findings. Diffuse degenerative arthritis  throughout.Interstitial catheter in place.  AP view of the pelvis.Taken and reviewed in the  office today.  INTERPRETATION: No acute findings.Hip joint spaces are maintained and  congruent.   She reports that she has never smoked. She has never used smokeless tobacco. No results for input(s): HGBA1C, LABURIC in the last 8760 hours.  Objective:  VS:  HT:    WT:   BMI:     BP:(!) 143/73  HR:84bpm  TEMP:98 F (36.7 C)(Oral)  RESP:100 % Physical Exam  Constitutional: She is oriented to person, place, and time. She appears well-developed and well-nourished. No distress.  Mildly obese  HENT:  Head: Normocephalic and atraumatic.  Nose: Nose normal.  Mouth/Throat: Oropharynx is clear and moist.  Eyes: Pupils are equal, round, and reactive to light. Conjunctivae and EOM are normal.  Wears glasses  Neck: Neck supple. No JVD present. No tracheal deviation present.  Cardiovascular: Regular rhythm and intact distal pulses.  Pulmonary/Chest: Effort normal. No respiratory distress.  Abdominal: She exhibits no  distension. There is no rebound and no guarding.  Musculoskeletal:  Patient ambulates without aid with somewhat of a wide-based stance.  She has pain going from sit to stand and does this very slowly.  She has concordant low back pain with extension rotation of the lumbar spine left and right.  This is concordant pain with facet joint loading.  She is tender across the lumbar spine with tender points in the paraspinal musculature PSIS and greater trochanters.  She is not exquisitely tender over the greater trochanters.  She has no pain with hip rotation internal or external.  She has good distal strength without clonus.  She has a negative slump test bilaterally  Neurological: She is alert and oriented to person, place, and time. She exhibits normal muscle tone. Coordination normal.  Skin: Skin is warm. No rash noted. No erythema.  Psychiatric: She has a normal mood and affect. Her behavior is normal.  Nursing note and vitals reviewed.   Ortho Exam Imaging: No results  found.  Past Medical/Family/Surgical/Social History: Medications & Allergies reviewed per EMR, new medications updated. Patient Active Problem List   Diagnosis Date Noted  . Difficulty walking 06/19/2014  . Overactive bladder   . Migraine   . Fibromyalgia   . Chronic fatigue syndrome   . Metatarsal fracture   . Diabetes mellitus   . Ankle fracture   . Osteoarthritis   . IBS (irritable bowel syndrome)   . Depression   . Chronic urethritis   . Degenerative disc disease, lumbar   . Hypercholesteremia   . Prolactin increased (HCC)    Past Medical History:  Diagnosis Date  . Ankle fracture    bimeolar fracture  . Anxiety   . Chronic urethritis    frequent uti's and or urethritis  . Degenerative disc disease, lumbar 2007   bulging disc  . Depression   . Diabetes mellitus 2004  . Fibromyalgia   . Glaucoma   . Hypercholesteremia   . IBS (irritable bowel syndrome)   . 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 Migraine   . Osteoarthritis   . Overactive bladder   . Prolactin increased (HCC) 05/2002   NORMAL MRI   Family History  Problem Relation Age of Onset  . Other Daughter        needle stick at hospital died of AIDS and hepatiitis  . Diabetes Mother   . Hypertension Mother   . Heart disease Mother   . Heart attack Mother   . Diabetes Father   . Heart disease Father   . Hypertension Father   . Rheumatic fever Father   . Diabetes Sister   . Osteoarthritis Sister   . Heart attack Sister   . Diabetes Brother   . Heart attack Brother   . Cancer Maternal Grandmother        [facility] [place]  . Other Son        paralyzed after motorcycle accident   Past Surgical History:  Procedure Laterality Date  . ABDOMINAL HYSTERECTOMY    . APPENDECTOMY  age 14  . BREAST BIOPSY     x 2  . BREAST EXCISIONAL BIOPSY  Left 1994  . BREAST EXCISIONAL BIOPSY Left 1994  . BURCH PROCEDURE    . CARPECTOMY HAND     LEFT FOR KEINBACH'S DZ  .  CATARACT EXTRACTION    . CHOLECYSTECTOMY OPEN     WITH CDE  . CYSTOSCOPY     X 4  . FRACTURE METARSAL    . FUSION OF DISCS     C 3-4 4-5 5-6 VERTBRAES IN NECK DUE TO DEGEN. DISC DZ  . FUSION OF LEFT WRIST WITH PLATE AND SCREWS  2009   CADAVER BONE GRAFT AND APPLICATION OF HUMAN GROWTH FACTORS  . HEMORROIDECTOMY  age 59  . HYSTEROSCOPY     D&C,HYSTEROSCOPY FOR PED FIBROID   . INTERSTIM IMPLANT PLACEMENT    . ORIF RADIAL HEAD / NECK FRACTURE  2010   RIGHT RADIAL HED EXTENDING INTO JOINT SPACE WITH PLATE AND SCREWS  . PERCUTANEOUS PINNING Left 01/31/2017   Procedure: PERCUTANEOUS PINNING EXTREMITYLEFT SMALL FINGER;  Surgeon: Cindee Salt, MD;  Location: [facility];  Service: Orthopedics;  Laterality: Left;  . RIGHT ANKLE FRACTURE    . ROTATOR CUFF REPAIR    . TONSILLECTOMY AND ADENOIDECTOMY    . TUBAL LIGATION     Social History   Occupational History  . Not on file  Tobacco Use  . Smoking status: Never Smoker  . Smokeless tobacco: Never Used  Substance and Sexual Activity  . Alcohol use: No    Alcohol/week: 0.0 oz  . Drug use: No  . Sexual activity: Never    Birth control/protection: Surgical, Post-menopausal    Comment: 1st intercourse 72 yo--Fewer than 5 partners

## 2017-07-26 NOTE — Procedures (Signed)
Lumbar Epidural Steroid Injection - Interlaminar Approach with Fluoroscopic Guidance  Patient: Linda Lucas      Date of Birth: [birth year] MRN: [id number] PCP: Lorenda Ishihara, MD      Visit Date: 07/19/2017   Universal Protocol:     Consent Given By: the patient  Position: PRONE  Additional Comments: Vital signs were monitored before and after the procedure. Patient was prepped and draped in the usual sterile fashion. The correct patient, procedure, and site was verified.   Injection Procedure Details:  Procedure Site One Meds Administered:  Meds ordered this encounter  Medications  . methylPREDNISolone acetate (DEPO-MEDROL) injection 80 mg     Laterality: Right  Location/Site:  L4-L5  Needle size: 20 G  Needle type: Tuohy  Needle Placement: Paramedian epidural  Findings:   -Comments: Excellent flow of contrast into the epidural space.  Procedure Details: Using a paramedian approach from the side mentioned above, the region overlying the inferior lamina was localized under fluoroscopic visualization and the soft tissues overlying this structure were infiltrated with 4 ml. of 1% Lidocaine without Epinephrine. The Tuohy needle was inserted into the epidural space using a paramedian approach.   The epidural space was localized using loss of resistance along with lateral and bi-planar fluoroscopic views.  After negative aspirate for air, blood, and CSF, a 2 ml. volume of Isovue-250 was injected into the epidural space and the flow of contrast was observed. Radiographs were obtained for documentation purposes.    The injectate was administered into the level noted above.   Additional Comments:  The patient tolerated the procedure well Dressing: Band-Aid    Post-procedure details: Patient was observed during the procedure. Post-procedure instructions were reviewed.  Patient left the clinic in stable condition.

## 2017-07-26 NOTE — Progress Notes (Signed)
Linda Lucas - 72 y.o. female MRN [id number]  Date of birth: Jun 29, 1945  Office Visit Note: Visit Date: 07/19/2017 PCP: Lorenda Ishihara, MD Referred by: Lorenda Ishihara,*  Subjective: Chief Complaint  Patient presents with  . Lower Back - Pain  . Right Leg - Pain  . Left Leg - Pain   HPI: Linda Lucas is a 72 year old patient that comes in today for review of CT scan of the lumbar spine and planned epidural injection.  Brief review of the lumbar CT scan shows progressive spondylosis and facet arthropathy from prior imaging.  She has moderate stenosis at L4-5 which is progressed over the time where she had really no stenosis in the past.  This is still moderate and for her age probably would not progressed to a large degree.  There is no listhesis.  She has by foraminal narrowing at L5 Duda severe facet arthropathy.  Her case is complicated by fibromyalgia and major depressive disorder as well as irritable bowel syndrome and migraine headache and chronic pain syndrome with history of pain management and multiple medications.  We talked at length about repeating the epidural injection which is helped her in the past.  I would do this at L4-5 do the stenosis and see how much relief she gets.  Depending on relief would look at either a transforaminal approach or facet joint block.  She is getting radicular pain down the legs.  She has a history of prior cervical fusion but no lumbar spine surgery.  She could end up being a candidate for spinal cord stimulator.  She currently has an InterStim device for her bladder.   ROS Otherwise per HPI.  Assessment & Plan: Visit Diagnoses:  1. Lumbar radiculopathy   2. Spinal stenosis of lumbar region with neurogenic claudication   3. Spondylosis without myelopathy or radiculopathy, lumbar region   4. Chronic pain syndrome     Plan: No additional findings.   Meds & Orders:  Meds ordered this encounter  Medications  . methylPREDNISolone  acetate (DEPO-MEDROL) injection 80 mg    Orders Placed This Encounter  Procedures  . XR C-ARM NO REPORT  . Epidural Steroid injection    Follow-up: Return in about 3 weeks (around 08/09/2017) for possible facet blocks.   Procedures: No procedures performed  Lumbar Epidural Steroid Injection - Interlaminar Approach with Fluoroscopic Guidance  Patient: Linda Lucas      Date of Birth: 1945-08-10 MRN: [id number] PCP: Lorenda Ishihara, MD      Visit Date: 07/19/2017   Universal Protocol:     Consent Given By: the patient  Position: PRONE  Additional Comments: Vital signs were monitored before and after the procedure. Patient was prepped and draped in the usual sterile fashion. The correct patient, procedure, and site was verified.   Injection Procedure Details:  Procedure Site One Meds Administered:  Meds ordered this encounter  Medications  . methylPREDNISolone acetate (DEPO-MEDROL) injection 80 mg     Laterality: Right  Location/Site:  L4-L5  Needle size: 20 G  Needle type: Tuohy  Needle Placement: Paramedian epidural  Findings:   -Comments: Excellent flow of contrast into the epidural space.  Procedure Details: Using a paramedian approach from the side mentioned above, the region overlying the inferior lamina was localized under fluoroscopic visualization and the soft tissues overlying this structure were infiltrated with 4 ml. of 1% Lidocaine without Epinephrine. The Tuohy needle was inserted into the epidural space using a paramedian approach.   The epidural space  was localized using loss of resistance along with lateral and bi-planar fluoroscopic views.  After negative aspirate for air, blood, and CSF, a 2 ml. volume of Isovue-250 was injected into the epidural space and the flow of contrast was observed. Radiographs were obtained for documentation purposes.    The injectate was administered into the level noted above.   Additional Comments:  The  patient tolerated the procedure well Dressing: Band-Aid    Post-procedure details: Patient was observed during the procedure. Post-procedure instructions were reviewed.  Patient left the clinic in stable condition.   Clinical History: CT LUMBAR SPINE WITHOUT CONTRAST  TECHNIQUE: Multidetector CT imaging of the lumbar spine was performed without intravenous contrast administration. Multiplanar CT image reconstructions were also generated.  COMPARISON: Lumbar spine MRI 12/22/2007  FINDINGS: Segmentation: 5 lumbar type vertebrae.  Alignment: Chronic trace retrolisthesis of L1 on L2, L2 on L3, and L3 on L4. Trace anterolisthesis of L4 on L5. Slight left convex lumbar curvature.  Vertebrae: No acute fracture or destructive osseous process. Progressive, severe disc space narrowing at L5-S1 with prominent degenerative vertebral sclerosis. Asymmetric right-sided endplate sclerosis at L4-5. Vacuum disc from L3-4 to L5-S1.  Paraspinal and other soft tissues: Status post cholecystectomy. Nonobstructing 6 mm right lower pole renal calculus. Minimal abdominal aortic atherosclerosis.  Disc levels:  T12-L1: Mild disc bulging without stenosis.  L1-2: Chronic mild disc bulging asymmetric to the left without stenosis.  L2-3: Circumferential disc bulging, stable to slightly increased. Mild facet and ligamentum flavum hypertrophy. No stenosis.  L3-4: Increased circumferential disc bulging and mild-to-moderate facet and ligamentum flavum hypertrophy result in borderline to mild spinal stenosis without neural foraminal stenosis.  L4-5: Progressive disc and facet degeneration. Circumferential disc bulging, ligamentum flavum hypertrophy, and severe facet arthrosis result in new moderate spinal stenosis, left greater than right lateral recess stenosis, and mild-to-moderate right and mild left neural foraminal stenosis.  L5-S1: Similar broad central disc protrusion without  significant spinal stenosis. Disc bulging, endplate spurring, disc space height loss, and moderate facet hypertrophy result in progressive, moderate to severe bilateral neural foraminal stenosis. Potential bilateral L5 nerve root impingement.  IMPRESSION: 1. Advanced lumbar disc and facet degeneration, progressed from 2009. 2. New moderate spinal stenosis and mild-to-moderate neural foraminal stenosis at L4-5. 3. Progressive moderate to severe bilateral neural foraminal stenosis at L5-S1. 4. Borderline to mild spinal stenosis at L3-4. 5. Nonobstructing right nephrolithiasis. 6. Aortic Atherosclerosis (ICD10-I70.0).   Electronically Signed By: Sebastian Ache M.D. On: 07/07/2017 14:21    06/03/2017 IMAGING:  X-RAYS: AP and lateral view of the lumbar spine. These were taken and  reviewed in the office today.  INTERPRETATION: No acute findings. Diffuse degenerative arthritis  throughout.Interstitial catheter in place.  AP view of the pelvis.Taken and reviewed in the office today.  INTERPRETATION: No acute findings.Hip joint spaces are maintained and  congruent.   She reports that she has never smoked. She has never used smokeless tobacco. No results for input(s): HGBA1C, LABURIC in the last 8760 hours.  Objective:  VS:  HT:    WT:   BMI:     BP:135/75  HR:85bpm  TEMP: ( )  RESP:  Physical Exam  Ortho Exam Imaging: No results found.  Past Medical/Family/Surgical/Social History: Medications & Allergies reviewed per EMR, new medications updated. Patient Active Problem List   Diagnosis Date Noted  . Difficulty walking 06/19/2014  . Overactive bladder   . Migraine   . Fibromyalgia   . Chronic fatigue syndrome   . Metatarsal fracture   .  Diabetes mellitus   . Ankle fracture   . Osteoarthritis   . IBS (irritable bowel syndrome)   . Depression   . Chronic urethritis   . Degenerative disc disease, lumbar   . Hypercholesteremia   . Prolactin increased (HCC)      Past Medical History:  Diagnosis Date  . Ankle fracture    bimeolar fracture  . Anxiety   . Chronic urethritis    frequent uti's and or urethritis  . Degenerative disc disease, lumbar 2007   bulging disc  . Depression   . Diabetes mellitus 2004  . Fibromyalgia   . Glaucoma   . Hypercholesteremia   . IBS (irritable bowel syndrome)   . 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 Migraine   . Osteoarthritis   . Overactive bladder   . Prolactin increased (HCC) 05/2002   NORMAL MRI   Family History  Problem Relation Age of Onset  . Other Daughter        needle stick at hospital died of AIDS and hepatiitis  . Diabetes Mother   . Hypertension Mother   . Heart disease Mother   . Heart attack Mother   . Diabetes Father   . Heart disease Father   . Hypertension Father   . Rheumatic fever Father   . Diabetes Sister   . Osteoarthritis Sister   . Heart attack Sister   . Diabetes Brother   . Heart attack Brother   . Cancer Maternal Grandmother        [facility]  . Other Son        paralyzed after motorcycle accident   Past Surgical History:  Procedure Laterality Date  . ABDOMINAL HYSTERECTOMY    . APPENDECTOMY  age 18  . BREAST BIOPSY     x 2  . BREAST EXCISIONAL BIOPSY Left 1994  . BREAST EXCISIONAL BIOPSY Left 1994  . BURCH PROCEDURE    . CARPECTOMY HAND     LEFT FOR KEINBACH'S DZ  . CATARACT EXTRACTION    . CHOLECYSTECTOMY OPEN     WITH CDE  . CYSTOSCOPY     X 4  . FRACTURE METARSAL    . FUSION OF DISCS     C 3-4 4-5 5-6 VERTBRAES IN NECK DUE TO DEGEN. DISC DZ  . FUSION OF LEFT WRIST WITH PLATE AND SCREWS  2009   CADAVER BONE GRAFT AND APPLICATION OF HUMAN GROWTH FACTORS  . HEMORROIDECTOMY  age 20  . HYSTEROSCOPY     D&C,HYSTEROSCOPY FOR PED FIBROID   . INTERSTIM IMPLANT PLACEMENT    . ORIF  RADIAL HEAD / NECK FRACTURE  2010   RIGHT RADIAL HED EXTENDING INTO JOINT SPACE WITH PLATE AND SCREWS  . PERCUTANEOUS PINNING Left 01/31/2017   Procedure: PERCUTANEOUS PINNING EXTREMITYLEFT SMALL FINGER;  Surgeon: Cindee Salt, MD;  Location: [facility];  Service: Orthopedics;  Laterality: Left;  . RIGHT ANKLE FRACTURE    . ROTATOR CUFF REPAIR    . TONSILLECTOMY AND ADENOIDECTOMY    . TUBAL LIGATION     Social History   Occupational History  . Not on file  Tobacco Use  . Smoking status: Never Smoker  . Smokeless tobacco: Never Used  Substance and Sexual Activity  . Alcohol use: No    Alcohol/week: 0.0 oz  .  Drug use: No  . Sexual activity: Never    Birth control/protection: Surgical, Post-menopausal    Comment: 1st intercourse 72 yo--Fewer than 5 partners

## 2017-07-27 ENCOUNTER — Ambulatory Visit
Admission: RE | Admit: 2017-07-27 | Discharge: 2017-07-27 | Disposition: A | Discharge status: Home / Self Care | Payer: Medicare Other | Source: Ambulatory Visit | Attending: Gynecology | Admitting: Gynecology

## 2017-07-27 DIAGNOSIS — Z1231 Encounter for screening mammogram for malignant neoplasm of breast: Secondary | ICD-10-CM

## 2017-08-09 ENCOUNTER — Encounter (INDEPENDENT_AMBULATORY_CARE_PROVIDER_SITE_OTHER): Payer: Self-pay | Admitting: Physical Medicine and Rehabilitation

## 2017-08-17 ENCOUNTER — Ambulatory Visit: Payer: Medicare Other | Admitting: Gynecology

## 2017-08-17 ENCOUNTER — Encounter: Payer: Self-pay | Admitting: Gynecology

## 2017-08-17 VITALS — BP 130/82 | Ht 63.0 in | Wt 201.0 lb

## 2017-08-17 DIAGNOSIS — Z01419 Encounter for gynecological examination (general) (routine) without abnormal findings: Secondary | ICD-10-CM | POA: Diagnosis not present

## 2017-08-17 DIAGNOSIS — N952 Postmenopausal atrophic vaginitis: Secondary | ICD-10-CM | POA: Diagnosis not present

## 2017-08-17 DIAGNOSIS — Z79890 Hormone replacement therapy (postmenopausal): Secondary | ICD-10-CM | POA: Diagnosis not present

## 2017-08-17 MED ORDER — ESTRADIOL 0.5 MG PO TABS: 0.5000 mg | ORAL_TABLET | Freq: Every day | ORAL | 0 refills | Status: DC

## 2017-08-17 MED ORDER — ESTROGENS, CONJUGATED 0.625 MG/GM VA CREA: TOPICAL_CREAM | VAGINAL | 6 refills | Status: DC

## 2017-08-17 NOTE — Progress Notes (Signed)
    Linda KehrJudith A Lucas [birth year] [id number]        72 y.o.  G2P2001 for annual gynecologic exam.  Doing well without gynecologic complaints.  Past medical history,surgical history, problem list, medications, allergies, family history and social history were all reviewed and documented as reviewed in the EPIC chart.  ROS:  Performed with pertinent positives and negatives included in the history, assessment and plan.   Additional significant findings : None   Exam: Kennon PortelaKim Gardner assistant Vitals:   08/17/17 1357  BP: 130/82  Weight: 201 lb (91.2 kg)  Height: 5\' 3"  (1.6 m)   Body mass index is 35.61 kg/m.  General appearance:  Normal affect, orientation and appearance. Skin: Grossly normal HEENT: Without gross lesions.  No cervical or supraclavicular adenopathy. Thyroid normal.  Lungs:  Clear without wheezing, rales or rhonchi Cardiac: RR, without RMG Abdominal:  Soft, nontender, without masses, guarding, rebound, organomegaly or hernia Breasts:  Examined lying and sitting without masses, retractions, discharge or axillary adenopathy. Pelvic:  Ext, BUS, Vagina: With atrophic changes  Adnexa: Without masses or tenderness    Anus and perineum: Normal   Rectovaginal: Normal sphincter tone without palpated masses or tenderness.    Assessment/Plan:  72 y.o. 222P2001 female for annual gynecologic exam status post TAH in the past.   1. Postmenopausal/atrophic genital changes/HRT.  Continues on estradiol 0.5 mg p.o. and Premarin vaginal cream twice weekly.  We again discussed the risks versus benefits to include thrombosis such as stroke heart attack DVT.  The breast cancer issue also reviewed.  Patient wants to try to wean and will go ahead at 0.25 mg daily x1 month, then every other day for several weeks and then stop.  We will see if she tolerates this.  She will continue on her Premarin vaginal cream.  She does have a history of UTIs and I think this will help her with this.  I refilled her  estradiol tablets #90 and her Premarin cream 30 g 6 refill provided. 2. Pap smear 2009.  No Pap smear done today.  No history of abnormal Pap smears previously.  We both agree to stop screening per current screening guidelines based on age and hysterectomy history. 3. Colonoscopy scheduled next week. 4. Mammography 07/2017.  Continue with annual mammography next year.  Breast exam normal today. 5. DEXA 2018 normal.  Recommend repeat DEXA at 5-year interval. 6. Health maintenance.  No routine lab work done as patient does this elsewhere.  Follow-up 1 year, sooner as needed.   Dara Lordsimothy P Americus Scheurich MD, 2:27 PM 08/17/2017

## 2017-08-17 NOTE — Patient Instructions (Signed)
Follow-up in 1 year for annual exam, sooner as needed. 

## 2017-09-24 ENCOUNTER — Emergency Department (HOSPITAL_COMMUNITY)
Admission: EM | Admit: 2017-09-24 | Discharge: 2017-09-24 | Disposition: A | Discharge status: Home / Self Care | Payer: Medicare Other | Attending: Emergency Medicine | Admitting: Emergency Medicine

## 2017-09-24 ENCOUNTER — Emergency Department (HOSPITAL_COMMUNITY): Payer: Medicare Other

## 2017-09-24 ENCOUNTER — Encounter (HOSPITAL_COMMUNITY): Payer: Self-pay | Admitting: Emergency Medicine

## 2017-09-24 DIAGNOSIS — Y929 Unspecified place or not applicable: Secondary | ICD-10-CM | POA: Insufficient documentation

## 2017-09-24 DIAGNOSIS — W0110XA Fall on same level from slipping, tripping and stumbling with subsequent striking against unspecified object, initial encounter: Secondary | ICD-10-CM | POA: Diagnosis not present

## 2017-09-24 DIAGNOSIS — Z7982 Long term (current) use of aspirin: Secondary | ICD-10-CM | POA: Insufficient documentation

## 2017-09-24 DIAGNOSIS — Y9389 Activity, other specified: Secondary | ICD-10-CM | POA: Diagnosis not present

## 2017-09-24 DIAGNOSIS — Y999 Unspecified external cause status: Secondary | ICD-10-CM | POA: Diagnosis not present

## 2017-09-24 DIAGNOSIS — S52121A Displaced fracture of head of right radius, initial encounter for closed fracture: Secondary | ICD-10-CM | POA: Insufficient documentation

## 2017-09-24 DIAGNOSIS — Z79899 Other long term (current) drug therapy: Secondary | ICD-10-CM | POA: Insufficient documentation

## 2017-09-24 DIAGNOSIS — E119 Type 2 diabetes mellitus without complications: Secondary | ICD-10-CM | POA: Diagnosis not present

## 2017-09-24 DIAGNOSIS — S59911A Unspecified injury of right forearm, initial encounter: Secondary | ICD-10-CM | POA: Diagnosis present

## 2017-09-24 MED ORDER — ACETAMINOPHEN 325 MG PO TABS
650.0000 mg | ORAL_TABLET | Freq: Once | ORAL | Status: AC
  Administered 2017-09-24: 650 mg via ORAL
  Filled 2017-09-24: qty 2

## 2017-09-24 NOTE — ED Provider Notes (Signed)
[facility] EMERGENCY DEPARTMENT Provider Note   CSN: [id number] Arrival date & time: 09/24/17  1024     History   Chief Complaint Chief Complaint  Patient presents with  . Fall    HPI Linda Lucas is a 72 y.o. female.  72 year old female here after mechanical fall while going to church.  States she tripped and fell onto her left arm.  Denied any head or neck injury.  Has a sharp pain on her distal left humerus.  Denies any elbow discomfort.  No left hand or wrist discomfort.  Denies any left rib pain.  She does not have any chest pain and is not short of breath.  No symptoms prior to the fall.  No pain from below the waist.  Pain in her left arm is worse with movement and better with remaining still.  Called EMS was transported here.     Past Medical History:  Diagnosis Date  . Ankle fracture    bimeolar fracture  . Anxiety   . Chronic urethritis    frequent uti's and or urethritis  . Degenerative disc disease, lumbar 2007   bulging disc  . Depression   . Diabetes mellitus 2004  . Fibromyalgia   . Glaucoma   . Hypercholesteremia   . IBS (irritable bowel syndrome)   . 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 Migraine   . Osteoarthritis   . Overactive bladder   . Prolactin increased (HCC) 05/2002   NORMAL MRI    Patient Active Problem List   Diagnosis Date Noted  . Difficulty walking 06/19/2014  . Overactive bladder   . Migraine   . Fibromyalgia   . Chronic fatigue syndrome   . Metatarsal fracture   . Diabetes mellitus   . Ankle fracture   . Osteoarthritis   . IBS (irritable bowel syndrome)   . Depression   . Chronic urethritis   . Degenerative disc disease, lumbar   . Hypercholesteremia   . Prolactin increased (HCC)     Past Surgical History:  Procedure Laterality Date    . ABDOMINAL HYSTERECTOMY    . APPENDECTOMY  age 17  . BREAST BIOPSY     x 2  . BREAST EXCISIONAL BIOPSY Left 1994  . BREAST EXCISIONAL BIOPSY Left 1994  . BURCH PROCEDURE    . CARPECTOMY HAND     LEFT FOR KEINBACH'S DZ  . CATARACT EXTRACTION    . CHOLECYSTECTOMY OPEN     WITH CDE  . CYSTOSCOPY     X 4  . FRACTURE METARSAL    . FUSION OF DISCS     C 3-4 4-5 5-6 VERTBRAES IN NECK DUE TO DEGEN. DISC DZ  . FUSION OF LEFT WRIST WITH PLATE AND SCREWS  2009   CADAVER BONE GRAFT AND APPLICATION OF HUMAN GROWTH FACTORS  . HEMORROIDECTOMY  age 36  . HYSTEROSCOPY     D&C,HYSTEROSCOPY FOR PED FIBROID   . INTERSTIM IMPLANT PLACEMENT    . ORIF RADIAL HEAD / NECK FRACTURE  2010   RIGHT RADIAL HED EXTENDING INTO JOINT SPACE WITH PLATE AND SCREWS  . PERCUTANEOUS PINNING Left 01/31/2017   Procedure: PERCUTANEOUS PINNING EXTREMITYLEFT SMALL FINGER;  Surgeon: Cindee Salt, MD;  Location: [facility];  Service: Orthopedics;  Laterality: Left;  . RIGHT ANKLE FRACTURE    .  ROTATOR CUFF REPAIR    . TONSILLECTOMY AND ADENOIDECTOMY    . TUBAL LIGATION       OB History    Gravida  2   Para  2   Term  2   Preterm      AB  0   Living  1     SAB      TAB      Ectopic      Multiple      Live Births               Home Medications    Prior to Admission medications   Medication Sig Start Date End Date Taking? Authorizing Provider  ARIPiprazole (ABILIFY) 5 MG tablet Take 5 mg by mouth daily. Taking 1.5 tablets starting 01/13/2016    [provider]  ARIPiprazole (ABILIFY) 5 MG tablet Take by mouth. 05/17/17   [provider]  aspirin 81 MG tablet Take 81 mg by mouth 2 (two) times daily. Takes two tabs am and pm.     [provider]  atorvastatin (LIPITOR) 40 MG tablet Take 40 mg by mouth daily.      [provider]  calcium citrate-vitamin D (CITRACAL+D) 315-200 MG-UNIT tablet Take 1 tablet by mouth daily.     [provider]  clonazePAM (KLONOPIN) 0.5 MG tablet Take by mouth. 05/17/17   [provider]  conjugated estrogens (PREMARIN) vaginal cream PLACE 1 APPLICATORFUL  VAGINALLY 2 (TWO) TIMES A  WEEK. 08/17/17   Fontaine, Nadyne Coombesimothy P, MD  DiphenhydrAMINE HCl (BENADRYL PO) Take by mouth daily as needed.     [provider]  estradiol (ESTRACE) 0.5 MG tablet Take 1 tablet (0.5 mg total) by mouth daily. 08/17/17   Fontaine, Nadyne Coombesimothy P, MD  FIBER PO Take by mouth.    [provider]  HYDROcodone-acetaminophen (NORCO) 5-325 MG tablet Take 1 tablet by mouth every 6 (six) hours as needed. 01/31/17   Cindee SaltKuzma, Gary, MD  HYDROmorphone (DILAUDID) 4 MG tablet Take by mouth every 4 (four) hours as needed for severe pain.    [provider]  latanoprost (XALATAN) 0.005 % ophthalmic solution Place 1 drop into both eyes 2 (two) times daily.    [provider]  levothyroxine (SYNTHROID) 100 MCG tablet Take 100 mcg by mouth daily.      [provider]  lisinopril (PRINIVIL,ZESTRIL) 10 MG tablet Take 10 mg by mouth daily.    [provider]  loratadine (CLARITIN) 10 MG tablet Take 10 mg by mouth daily.    [provider]  Multiple Vitamin (MULTIVITAMIN) tablet Take 1 tablet by mouth daily.    [provider]  omeprazole (PRILOSEC) 20 MG capsule Take 20 mg by mouth daily.    [provider]  oxybutynin (DITROPAN-XL) 10 MG 24 hr tablet Take 10 mg by mouth 2 (two) times daily.     [provider]  promethazine (PHENERGAN) 25 MG tablet Take 25 mg by mouth every 6 (six) hours as needed.      [provider]  propranolol (INDERAL) 20 MG tablet  06/08/17   [provider]  repaglinide (PRANDIN) 2 MG tablet  06/21/17   [provider]  rOPINIRole (REQUIP) 0.5 MG tablet Take 0.5 mg by mouth 3 (three) times daily.    [provider]  Suvorexant (BELSOMRA) 20 MG TABS Take by mouth.    [provider]   trimethoprim (TRIMPEX) 100 MG tablet Take 100 mg  by mouth 2 (two) times daily.    [provider]  Venlafaxine HCl (EFFEXOR PO) Take by mouth 2 (two) times daily.     [provider]    Family History Family History  Problem Relation Age of Onset  . Other Daughter        needle stick at hospital died of AIDS and hepatiitis  . Diabetes Mother   . Hypertension Mother   . Heart disease Mother   . Heart attack Mother   . Diabetes Father   . Heart disease Father   . Hypertension Father   . Rheumatic fever Father   . Diabetes Sister   . Osteoarthritis Sister   . Heart attack Sister   . Diabetes Brother   . Heart attack Brother   . Cancer Maternal Grandmother        [facility]  . Other Son        paralyzed after motorcycle accident    Social History Social History   Tobacco Use  . Smoking status: Never Smoker  . Smokeless tobacco: Never Used  Substance Use Topics  . Alcohol use: No    Alcohol/week: 0.0 oz  . Drug use: No     Allergies   Adhesive [tape]; Aspirin; Oxycodone-acetaminophen; Oxymorphone hcl; and Ivp dye [iodinated diagnostic agents]   Review of Systems Review of Systems  All other systems reviewed and are negative.    Physical Exam Updated Vital Signs BP (!) 153/84 (BP Location: Right Arm)   Pulse 95   Temp 98.9 F (37.2 C) (Oral)   Resp 16   SpO2 98%   Physical Exam  Constitutional: She is oriented to person, place, and time. She appears well-developed and well-nourished.  Non-toxic appearance. No distress.  HENT:  Head: Normocephalic and atraumatic.  Eyes: Pupils are equal, round, and reactive to light. Conjunctivae, EOM and lids are normal.  Neck: Normal range of motion. Neck supple. No tracheal deviation present. No thyroid mass present.  Cardiovascular: Normal rate, regular rhythm and normal heart sounds. Exam reveals no gallop.  No murmur heard. Pulmonary/Chest: Effort normal and breath sounds normal. No stridor. No  respiratory distress. She has no decreased breath sounds. She has no wheezes. She has no rhonchi. She has no rales.  Abdominal: Soft. Normal appearance and bowel sounds are normal. She exhibits no distension. There is no tenderness. There is no rebound and no CVA tenderness.  Musculoskeletal: Normal range of motion. She exhibits no edema or tenderness.       Arms: Full range of motion at the left elbow.  Left wrist is at baseline.  She does have a prior history of left wrist surgery and has baseline decreased range of motion.  Neurovascular intact left hand  Neurological: She is alert and oriented to person, place, and time. She has normal strength. No cranial nerve deficit or sensory deficit. GCS eye subscore is 4. GCS verbal subscore is 5. GCS motor subscore is 6.  Skin: Skin is warm and dry. No abrasion and no rash noted.  Psychiatric: She has a normal mood and affect. Her speech is normal and behavior is normal.  Nursing note and vitals reviewed.    ED Treatments / Results  Labs (all labs ordered are listed, but only abnormal results are displayed) Labs Reviewed - No data to display  EKG None  Radiology No results found.  Procedures Procedures (including critical care time)  Medications Ordered in ED Medications  acetaminophen (TYLENOL) tablet 650 mg (  has no administration in time range)     Initial Impression / Assessment and Plan / ED Course  I have reviewed the triage vital signs and the nursing notes.  Pertinent labs & imaging results that were available during my care of the patient were reviewed by me and considered in my medical decision making (see chart for details).     Patient to be placed in a sling and given referral to orthopedics on-call.  Final Clinical Impressions(s) / ED Diagnoses   Final diagnoses:  None    ED Discharge Orders    None       Lorre Nick, MD 09/24/17 1404

## 2017-09-24 NOTE — ED Notes (Signed)
Patient transported to X-ray 

## 2017-09-24 NOTE — ED Triage Notes (Signed)
Pt here after tripping and falling at church , pt is c/o left shoulder pain

## 2017-09-24 NOTE — Discharge Instructions (Addendum)
Use Tylenol and Motrin as needed for pain °

## 2017-10-02 ENCOUNTER — Other Ambulatory Visit: Payer: Self-pay | Admitting: Gynecology

## 2018-03-05 ENCOUNTER — Ambulatory Visit
Admission: RE | Admit: 2018-03-05 | Discharge: 2018-03-05 | Disposition: A | Discharge status: Home / Self Care | Payer: Medicare Other | Source: Ambulatory Visit | Attending: Internal Medicine | Admitting: Internal Medicine

## 2018-03-05 ENCOUNTER — Other Ambulatory Visit: Payer: Self-pay | Admitting: Internal Medicine

## 2018-03-05 DIAGNOSIS — M79671 Pain in right foot: Secondary | ICD-10-CM

## 2018-03-14 DIAGNOSIS — B3741 Candidal cystitis and urethritis: Secondary | ICD-10-CM | POA: Insufficient documentation

## 2018-03-15 ENCOUNTER — Other Ambulatory Visit: Payer: Self-pay | Admitting: Podiatry

## 2018-03-15 ENCOUNTER — Ambulatory Visit (INDEPENDENT_AMBULATORY_CARE_PROVIDER_SITE_OTHER): Payer: Medicare Other

## 2018-03-15 ENCOUNTER — Encounter: Payer: Self-pay | Admitting: Podiatry

## 2018-03-15 ENCOUNTER — Ambulatory Visit: Payer: Medicare Other | Admitting: Podiatry

## 2018-03-15 DIAGNOSIS — M722 Plantar fascial fibromatosis: Secondary | ICD-10-CM

## 2018-03-15 DIAGNOSIS — L97511 Non-pressure chronic ulcer of other part of right foot limited to breakdown of skin: Secondary | ICD-10-CM

## 2018-03-15 DIAGNOSIS — M79671 Pain in right foot: Secondary | ICD-10-CM

## 2018-03-15 DIAGNOSIS — M79674 Pain in right toe(s): Secondary | ICD-10-CM | POA: Diagnosis not present

## 2018-03-15 DIAGNOSIS — J019 Acute sinusitis, unspecified: Secondary | ICD-10-CM | POA: Insufficient documentation

## 2018-03-15 DIAGNOSIS — M79675 Pain in left toe(s): Secondary | ICD-10-CM | POA: Diagnosis not present

## 2018-03-15 DIAGNOSIS — E119 Type 2 diabetes mellitus without complications: Secondary | ICD-10-CM

## 2018-03-15 DIAGNOSIS — B351 Tinea unguium: Secondary | ICD-10-CM

## 2018-03-15 MED ORDER — MUPIROCIN 2 % EX OINT: 1.0000 "application " | TOPICAL_OINTMENT | Freq: Two times a day (BID) | CUTANEOUS | 2 refills | Status: DC

## 2018-03-20 NOTE — Progress Notes (Signed)
Subjective:   Patient ID: Linda Lucas, female   DOB: 73 y.o.   MRN: [id number]   HPI 73 year old female presents the office today for concerns of thick, elongated toenails that she like to have trimmed.  She is they are painful with pressure in shoes.  She also states in the wintertime she gets cracking to her heels but occasionally bleed and she want to do some of the help of benefits but she currently denies any cracking or drainage or any open sores.  She also recently has fallen twice and she has had some pain to the left heel and she hit her foot against the side and she developed a wound to the side of her foot as well on the heel.  Denies any drainage or pus or swelling or redness.  No recent treatment.  No other concerns.  Review of Systems  All other systems reviewed and are negative.  Past Medical History:  Diagnosis Date  . Ankle fracture    bimeolar fracture  . Anxiety   . Chronic urethritis    frequent uti's and or urethritis  . Degenerative disc disease, lumbar 2007   bulging disc  . Depression   . Diabetes mellitus 2004  . Fibromyalgia   . Glaucoma   . Hypercholesteremia   . IBS (irritable bowel syndrome)   . 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 Migraine   . Osteoarthritis   . Overactive bladder   . Prolactin increased (HCC) 05/2002   NORMAL MRI    Past Surgical History:  Procedure Laterality Date  . ABDOMINAL HYSTERECTOMY    . APPENDECTOMY  age 49  . BREAST BIOPSY     x 2  . BREAST EXCISIONAL BIOPSY Left 1994  . BREAST EXCISIONAL BIOPSY Left 1994  . BURCH PROCEDURE    . CARPECTOMY HAND     LEFT FOR KEINBACH'S DZ  . CATARACT EXTRACTION    . CHOLECYSTECTOMY OPEN     WITH CDE  . CYSTOSCOPY     X 4  . FRACTURE METARSAL    . FUSION OF DISCS     C 3-4 4-5 5-6 VERTBRAES IN NECK DUE TO  DEGEN. DISC DZ  . FUSION OF LEFT WRIST WITH PLATE AND SCREWS  2009   CADAVER BONE GRAFT AND APPLICATION OF HUMAN GROWTH FACTORS  . HEMORROIDECTOMY  age 72  . HYSTEROSCOPY     D&C,HYSTEROSCOPY FOR PED FIBROID   . INTERSTIM IMPLANT PLACEMENT    . ORIF RADIAL HEAD / NECK FRACTURE  2010   RIGHT RADIAL HED EXTENDING INTO JOINT SPACE WITH PLATE AND SCREWS  . PERCUTANEOUS PINNING Left 01/31/2017   Procedure: PERCUTANEOUS PINNING EXTREMITYLEFT SMALL FINGER;  Surgeon: Cindee Salt, MD;  Location: [facility];  Service: Orthopedics;  Laterality: Left;  . RIGHT ANKLE FRACTURE    . ROTATOR CUFF REPAIR    . TONSILLECTOMY AND ADENOIDECTOMY    . TUBAL LIGATION       Current Outpatient Medications:  .  ARIPiprazole (ABILIFY) 5 MG tablet, Take 5 mg by mouth daily. Taking 1.5 tablets starting 01/13/2016, Disp: , Rfl:  .  ARIPiprazole (ABILIFY) 5 MG tablet, Take by mouth., Disp: , Rfl:  .  aspirin 81 MG tablet, Take 81 mg by mouth 2 (two) times daily. Takes two tabs am and pm. ,  Disp: , Rfl:  .  atorvastatin (LIPITOR) 40 MG tablet, Take 40 mg by mouth daily.  , Disp: , Rfl:  .  calcium citrate-vitamin D (CITRACAL+D) 315-200 MG-UNIT tablet, Take 1 tablet by mouth daily. , Disp: , Rfl:  .  clonazePAM (KLONOPIN) 0.5 MG tablet, Take by mouth., Disp: , Rfl:  .  conjugated estrogens (PREMARIN) vaginal cream, INSERT 1 APPLICATORFUL  VAGINALLY 2 TIMES A WEEK., Disp: 30 g, Rfl: 6 .  DiphenhydrAMINE HCl (BENADRYL PO), Take by mouth daily as needed. , Disp: , Rfl:  .  estradiol (ESTRACE) 0.5 MG tablet, Take 1 tablet (0.5 mg total) by mouth daily., Disp: 90 tablet, Rfl: 0 .  FIBER PO, Take by mouth., Disp: , Rfl:  .  HYDROcodone-acetaminophen (NORCO) 5-325 MG tablet, Take 1 tablet by mouth every 6 (six) hours as needed., Disp: 20 tablet, Rfl: 0 .  HYDROmorphone (DILAUDID) 4 MG tablet, Take by mouth every 4 (four) hours as needed for severe pain., Disp: , Rfl:  .  latanoprost (XALATAN) 0.005 % ophthalmic  solution, Place 1 drop into both eyes 2 (two) times daily., Disp: , Rfl:  .  levothyroxine (SYNTHROID) 100 MCG tablet, Take 100 mcg by mouth daily.  , Disp: , Rfl:  .  lisinopril (PRINIVIL,ZESTRIL) 10 MG tablet, Take 10 mg by mouth daily., Disp: , Rfl:  .  loratadine (CLARITIN) 10 MG tablet, Take 10 mg by mouth daily., Disp: , Rfl:  .  Multiple Vitamin (MULTIVITAMIN) tablet, Take 1 tablet by mouth daily., Disp: , Rfl:  .  mupirocin ointment (BACTROBAN) 2 %, Apply 1 application topically 2 (two) times daily., Disp: 30 g, Rfl: 2 .  omeprazole (PRILOSEC) 20 MG capsule, Take 20 mg by mouth daily., Disp: , Rfl:  .  oxybutynin (DITROPAN-XL) 10 MG 24 hr tablet, Take 10 mg by mouth 2 (two) times daily. , Disp: , Rfl:  .  promethazine (PHENERGAN) 25 MG tablet, Take 25 mg by mouth every 6 (six) hours as needed.  , Disp: , Rfl:  .  propranolol (INDERAL) 20 MG tablet, , Disp: , Rfl:  .  repaglinide (PRANDIN) 2 MG tablet, , Disp: , Rfl:  .  rOPINIRole (REQUIP) 0.5 MG tablet, Take 0.5 mg by mouth 3 (three) times daily., Disp: , Rfl:  .  Suvorexant (BELSOMRA) 20 MG TABS, Take by mouth., Disp: , Rfl:  .  trimethoprim (TRIMPEX) 100 MG tablet, Take 100 mg by mouth 2 (two) times daily., Disp: , Rfl:  .  Venlafaxine HCl (EFFEXOR PO), Take by mouth 2 (two) times daily. , Disp: , Rfl:   Allergies  Allergen Reactions  . Adhesive [Tape] Other (See Comments)    Causes skin to peel ok with paper tape  . Aspirin     "IN LARGE DOSES"  . Oxycodone-Acetaminophen Nausea And Vomiting  . Oxymorphone Hcl Nausea And Vomiting  . Ivp Dye [Iodinated Diagnostic Agents] Rash         Objective:  Physical Exam  General: AAO x3, NAD  Dermatological: Nails are hypertrophic, dystrophic, brittle, discolored, elongated 10. No surrounding redness or drainage. Tenderness nails 1-5 bilaterally.  The lateral aspect of the right heel there is a superficial abrasion type lesion and there is some skin slough around the area.  No  surrounding erythema, ascending cellulitis.  No fluctuation or crepitation or any malodor.  There is some dry skin present to the heel with mild superficial fissures but there is no open sores or drainage.  No open lesions  or pre-ulcerative lesions are identified today.  Vascular: Dorsalis Pedis artery and Posterior Tibial artery pedal pulses are 2/4 bilateral with immedate capillary fill time.  There is no pain with calf compression, swelling, warmth, erythema.   Neruologic: Grossly intact via light touch bilateral.  Protective threshold with Semmes Wienstein monofilament intact to all pedal sites bilateral.   Musculoskeletal:  Muscular strength 5/5 in all groups tested bilateral.       Assessment:   Symptomatic onychomycosis, dry skin, right heel abrasion     Plan:  -Treatment options discussed including all alternatives, risks, and complications -Etiology of symptoms were discussed -X-rays were obtained and reviewed with the patient.  No definitive evidence of acute fracture or stress fracture identified today. -Nails were debrided x10 without any complications or bleeding. -He said no treatment for the dry skin.  We discussed moisturizer to the area daily. -In regards to the abrasion on her heel I will prescribe mupirocin ointment and ointment applied daily.  Offloading.  Monitor for any signs or symptoms of infection.  Return in about 3 weeks (around 04/05/2018).  For wound check   Vivi BarrackMatthew R  DPM

## 2018-04-03 ENCOUNTER — Encounter: Payer: Self-pay | Admitting: Podiatry

## 2018-04-03 ENCOUNTER — Ambulatory Visit: Payer: Medicare Other | Admitting: Podiatry

## 2018-04-03 VITALS — BP 128/75 | HR 79 | Temp 97.6°F | Resp 14

## 2018-04-03 DIAGNOSIS — M79671 Pain in right foot: Secondary | ICD-10-CM | POA: Diagnosis not present

## 2018-04-03 DIAGNOSIS — L97511 Non-pressure chronic ulcer of other part of right foot limited to breakdown of skin: Secondary | ICD-10-CM | POA: Diagnosis not present

## 2018-04-03 MED ORDER — DOXYCYCLINE HYCLATE 100 MG PO TABS: 100.0000 mg | ORAL_TABLET | Freq: Two times a day (BID) | ORAL | 0 refills | Status: DC

## 2018-04-04 ENCOUNTER — Telehealth: Payer: Self-pay | Admitting: *Deleted

## 2018-04-04 DIAGNOSIS — M79671 Pain in right foot: Secondary | ICD-10-CM

## 2018-04-04 DIAGNOSIS — L97511 Non-pressure chronic ulcer of other part of right foot limited to breakdown of skin: Secondary | ICD-10-CM

## 2018-04-04 DIAGNOSIS — R0989 Other specified symptoms and signs involving the circulatory and respiratory systems: Secondary | ICD-10-CM

## 2018-04-04 NOTE — Telephone Encounter (Signed)
-----   Message from Vivi Barrack, DPM sent at 04/03/2018  1:23 PM EST ----- Can you please order an arterial duplex/ABI due to wound on the right heel? Thanks.

## 2018-04-04 NOTE — Telephone Encounter (Signed)
Orders faxed th [facility].

## 2018-04-09 ENCOUNTER — Ambulatory Visit (HOSPITAL_COMMUNITY)
Admission: RE | Admit: 2018-04-09 | Discharge: 2018-04-09 | Disposition: A | Discharge status: Home / Self Care | Payer: Medicare Other | Source: Ambulatory Visit | Attending: Cardiovascular Disease | Admitting: Cardiovascular Disease

## 2018-04-09 DIAGNOSIS — M79671 Pain in right foot: Secondary | ICD-10-CM | POA: Diagnosis not present

## 2018-04-09 DIAGNOSIS — L97511 Non-pressure chronic ulcer of other part of right foot limited to breakdown of skin: Secondary | ICD-10-CM | POA: Diagnosis present

## 2018-04-09 DIAGNOSIS — R0989 Other specified symptoms and signs involving the circulatory and respiratory systems: Secondary | ICD-10-CM

## 2018-04-10 ENCOUNTER — Ambulatory Visit: Payer: Medicare Other | Admitting: Podiatry

## 2018-04-10 ENCOUNTER — Telehealth: Payer: Self-pay | Admitting: *Deleted

## 2018-04-10 ENCOUNTER — Encounter: Payer: Self-pay | Admitting: Podiatry

## 2018-04-10 DIAGNOSIS — E1165 Type 2 diabetes mellitus with hyperglycemia: Secondary | ICD-10-CM | POA: Diagnosis not present

## 2018-04-10 DIAGNOSIS — M79671 Pain in right foot: Secondary | ICD-10-CM

## 2018-04-10 DIAGNOSIS — L97511 Non-pressure chronic ulcer of other part of right foot limited to breakdown of skin: Secondary | ICD-10-CM | POA: Diagnosis not present

## 2018-04-10 NOTE — Telephone Encounter (Signed)
Left message informing pt she could contact [facility] to schedule the [place].

## 2018-04-10 NOTE — Progress Notes (Signed)
Subjective: 73 year old female presents the office today for follow-up evaluation of a wound to the posterior aspect of the right heel.  She states the area still very tender.  She does not feel it is getting any better.  She has been keeping small meta Betadine on the area.  She also had a wound on the big toe management antibiotic ointment on this as has been getting better.  She denies any drainage or pus.  She is on oral antibiotics as well.  She returned wearing open back shoe to avoid the pressure to the heel.  This started from skin fissures. Denies any systemic complaints such as fevers, chills, nausea, vomiting. No acute changes since last appointment, and no other complaints at this time.   Objective: AAO x3, NAD DP/PT pulses palpable bilaterally, CRT less than 3 seconds To the posterior aspect of the heel on the right side is a superficial wound measures approximate 2 x 2 cm.  Some edema to this area there is no fluctuation or crepitation I can identify.  There is no ascending cellulitis there is a small rim of surrounding erythema directly along the lesion.  There is also a scab present on the medial aspect of the right hallux adjacent to the toenail which is getting better.  No drainage or pus or any redness to this area.  No pain. No open lesions or pre-ulcerative lesions.  No pain with calf compression, swelling, warmth, erythema  Assessment: Continuation of right posterior heel wound with healing wound right hallux  Plan: -All treatment options discussed with the patient including all alternatives, risks, complications.  -At this time I want him to continue the antibiotic ointment on the right hallux.  Recommend switch to Actisorb dressing changes to the heel daily.  The wound appears to be moist.  Pelvis will help dry this.  I will also order an ultrasound to rule out underlying abscess given the continuation of pain and swelling to this area.  We will admit this done this  week. -Finish course of antibiotics -Reviewed circulation studies with her today. -Discussed importance of glucose control -Monitoring signs or symptoms of worsening infection to the ER should any occur. -Patient encouraged to call the office with any questions, concerns, change in symptoms.   Vivi Barrack DPM

## 2018-04-11 ENCOUNTER — Other Ambulatory Visit: Payer: Self-pay | Admitting: Podiatry

## 2018-04-11 ENCOUNTER — Telehealth: Payer: Self-pay | Admitting: *Deleted

## 2018-04-11 ENCOUNTER — Ambulatory Visit
Admission: RE | Admit: 2018-04-11 | Discharge: 2018-04-11 | Disposition: A | Discharge status: Home / Self Care | Payer: Medicare Other | Source: Ambulatory Visit | Attending: Podiatry | Admitting: Podiatry

## 2018-04-11 DIAGNOSIS — L97511 Non-pressure chronic ulcer of other part of right foot limited to breakdown of skin: Secondary | ICD-10-CM

## 2018-04-11 DIAGNOSIS — M79671 Pain in right foot: Secondary | ICD-10-CM

## 2018-04-11 NOTE — Telephone Encounter (Signed)
-----   Message from Vivi Barrack, DPM sent at 04/10/2018  8:38 AM EST ----- Please let her know that her circulation appears to be adequate for healing.

## 2018-04-11 NOTE — Telephone Encounter (Signed)
I informed pt of Dr. Gabriel Rung review of results and asked if she had received my call concerning scheduling with [facility]. Pt states she had not called [facility] yet. I encouraged pt to call in hopes of getting her scheduled with [facility] for soft tissue US.

## 2018-04-12 ENCOUNTER — Telehealth: Payer: Self-pay | Admitting: *Deleted

## 2018-04-12 NOTE — Telephone Encounter (Signed)
-----   Message from Vivi BarrackMatthew R Wagoner, DPM sent at 04/12/2018  8:04 AM EST ----- Val- please let her know that the ultrasound did not show any abscess. I had her on keflex and she should be done with this today. Since there was still some redness to the area the ultrasound does shoe probable cellulitis, can you please order doxycycline 100mg  BID x 10 days. Thanks.

## 2018-04-12 NOTE — Progress Notes (Signed)
Subjective: 73 year old female presents the office today for follow-up evaluation of a wound on the right heel.  She states that there is no drainage but it is very sore.  She also states that the right big toe is been doing well is not having any issues.  She denies any red streaks or any redness around the area.  She also denies any fevers, chills, nausea, vomiting. No acute changes since last appointment, and no other complaints at this time.   Objective: AAO x3, NAD DP/PT pulses palpable bilaterally, CRT less than 3 seconds There is continuation of a wound to the posterior lateral aspect of the right heel.  This is a superficial abrasion type lesion that started after a skin fissure.  There is no drainage or pus identified to the area today but there is some macerated tissue.  There is no fluctuation crepitation is no malodor.  There is no ascending cellulitis.  There is no probing, undermining or tunneling.  Abrasion to the right hallux.  No open lesions or pre-ulcerative lesions.  No pain with calf compression, swelling, warmth, erythema  Assessment: Wounds right foot  Plan: -All treatment options discussed with the patient including all alternatives, risks, complications.  -In summary switch to Betadine on the right heel wound as well as antibiotic ointment on the right hallux wound.  Order arterial studies.  Prescribed Keflex.  Monitoring signs or symptoms of worsening infection to the ER should any occur. -Patient encouraged to call the office with any questions, concerns, change in symptoms.   Vivi Barrack DPM

## 2018-04-12 NOTE — Telephone Encounter (Signed)
I informed pt of Dr. Gabriel Rung review of results and orders. Pt states she is on doxycycline now. I told pt to continue doxycycline to completion. Pt states she is going out-of-town Sunday and can be contacted on her husband's cellphone.

## 2018-04-12 NOTE — Telephone Encounter (Signed)
Yes, sorry. I was looking at it wrong!!

## 2018-04-23 ENCOUNTER — Encounter: Payer: Self-pay | Admitting: Podiatry

## 2018-04-23 ENCOUNTER — Ambulatory Visit: Payer: Medicare Other | Admitting: Podiatry

## 2018-04-23 DIAGNOSIS — M79671 Pain in right foot: Secondary | ICD-10-CM

## 2018-04-23 DIAGNOSIS — L97511 Non-pressure chronic ulcer of other part of right foot limited to breakdown of skin: Secondary | ICD-10-CM | POA: Diagnosis not present

## 2018-04-23 MED ORDER — CEPHALEXIN 250 MG PO CAPS: 250.0000 mg | ORAL_CAPSULE | Freq: Two times a day (BID) | ORAL | 0 refills | Status: DC

## 2018-04-24 NOTE — Progress Notes (Signed)
Subjective: 73 year old female presents the office today for follow-up evaluation of a wound to the posterior aspect of the right heel as well as the right hallux.  She states that overall she feels that the pain has improved to the heel but is still tender.  They have been putting Actisorb on the wound and they feel this is been helping.  Denies any drainage or pus come from the area denies any redness or red streaks.  Also they have been keeping antibiotic ointment on the right hallux.  Denies any drainage or pus coming from the toe.  This started after she states that there is dry skin of the heel and she peeled the skin off resulting in the wound.  Denies any systemic complaints such as fevers, chills, nausea, vomiting. No acute changes since last appointment, and no other complaints at this time.   Objective: AAO x3, NAD DP/PT pulses palpable bilaterally, CRT less than 3 seconds To the posterior aspect of the heel on the right side is a superficial wound measures approximate 2 x 2 cm.  About the same in diameter however it is starting to callus over and onto redness of the loose skin there is new, healthy, pink skin present.  Overall it has much improved appearance.  There is no surrounding erythema, ascending cellulitis.  There is no fluctuation or crepitation or any malodor.  Overall there is decreased tenderness palpation of this area as well.  There is incurvation present on the right hallux toenail.  There is erythema to the distal hallux and she states that she just noticed this today.  Denies any drainage or pus coming from the area. No other open lesions or pre-ulcerative lesions. No pain with calf compression, swelling, warmth, erythema  Assessment: Continuation of right posterior heel wound with healing wound right hallux  Plan: -All treatment options discussed with the patient including all alternatives, risks, complications.  -I viewed the ultrasound with her this which did not  reveal any evidence of an abscess.  Also she had circulation test done previously.  The wound is getting better although slowly.  We will continue with the Actisorb on the wound area daily to the heel.  It is starting to dry it is too dry we discussed she can switch to using small amount of moisturizer/Vaseline.  I debrided some of the loose tissue today. -I will concerned with the redness on the hallux.  Because of this we will start Keflex.  I did read the toenail with any complications or bleeding.  No signs of an abscess or purulence.  Recommend open toed shoe. -Monitor for any clinical signs or symptoms of infection and directed to call the office immediately should any occur or go to the ER.  Return in about 2 weeks (around 05/07/2018).  Vivi Barrack DPM

## 2018-05-07 ENCOUNTER — Encounter: Payer: Self-pay | Admitting: Podiatry

## 2018-05-07 ENCOUNTER — Ambulatory Visit: Payer: Medicare Other | Admitting: Podiatry

## 2018-05-07 VITALS — BP 121/79 | HR 83 | Temp 98.2°F | Resp 14

## 2018-05-07 DIAGNOSIS — E1165 Type 2 diabetes mellitus with hyperglycemia: Secondary | ICD-10-CM | POA: Diagnosis not present

## 2018-05-07 DIAGNOSIS — L97511 Non-pressure chronic ulcer of other part of right foot limited to breakdown of skin: Secondary | ICD-10-CM | POA: Diagnosis not present

## 2018-05-12 NOTE — Progress Notes (Signed)
Subjective: 73 year old female presents the office today for follow-up evaluation of a wound to the posterior aspect of the right heel as well as the right hallux.  Overall she states that she is doing much better and her pain is much improved.  She denies any redness or drainage or any swelling.  She has no recent injury.  They have been keeping antibiotic ointment on the right hallux.  They have been keeping moisturizer on the heel. Denies any systemic complaints such as fevers, chills, nausea, vomiting. No acute changes since last appointment, and no other complaints at this time.   Objective: AAO x3, NAD DP/PT pulses palpable bilaterally, CRT less than 3 seconds To the posterior aspect of the heel.  The wound appears to be almost completely healed.  There is new, pink skin present and some dry skin was debrided today to reveal this.  There is one small area of a scab present.  There is no significant tenderness palpation of the area today.  Also along the right hallux this area appears to be most completely healed as well.  There is no edema, erythema, drainage or pus or any signs of infection.  No other open lesions or pre-ulcerative areas. No pain with calf compression, swelling, warmth, erythema  Assessment: Improvement of right posterior heel wound with healing wound right hallux  Plan: -All treatment options discussed with the patient including all alternatives, risks, complications.  -Overall the area too much better.  Debrided the loose hyperkeratotic tissue that any complications or bleeding.  Continue with moisturizer to the areas.  Continue offloading. -Monitor for any clinical signs or symptoms of infection and directed to call the office immediately should any occur or go to the ER.  Vivi Barrack DPM

## 2018-05-24 ENCOUNTER — Encounter: Payer: Self-pay | Admitting: Podiatry

## 2018-05-24 ENCOUNTER — Other Ambulatory Visit: Payer: Self-pay

## 2018-05-24 ENCOUNTER — Ambulatory Visit: Payer: Medicare Other | Admitting: Podiatry

## 2018-05-24 DIAGNOSIS — B351 Tinea unguium: Secondary | ICD-10-CM

## 2018-05-24 DIAGNOSIS — M79675 Pain in left toe(s): Secondary | ICD-10-CM

## 2018-05-24 DIAGNOSIS — L97511 Non-pressure chronic ulcer of other part of right foot limited to breakdown of skin: Secondary | ICD-10-CM | POA: Diagnosis not present

## 2018-05-24 DIAGNOSIS — M79674 Pain in right toe(s): Secondary | ICD-10-CM

## 2018-05-24 DIAGNOSIS — E1165 Type 2 diabetes mellitus with hyperglycemia: Secondary | ICD-10-CM

## 2018-05-25 ENCOUNTER — Other Ambulatory Visit: Payer: Self-pay | Admitting: Geriatric Medicine

## 2018-05-25 ENCOUNTER — Ambulatory Visit
Admission: RE | Admit: 2018-05-25 | Discharge: 2018-05-25 | Disposition: A | Discharge status: Home / Self Care | Payer: Medicare Other | Source: Ambulatory Visit | Attending: Geriatric Medicine | Admitting: Geriatric Medicine

## 2018-05-25 DIAGNOSIS — M542 Cervicalgia: Secondary | ICD-10-CM

## 2018-05-25 NOTE — Progress Notes (Signed)
Subjective: 73 year old female presents the office today for evaluation of wound to the back of her right heel as well as the right hallux.  She states the areas have healed and she is not noted any pain or discomfort.  She is asking her nails be trimmed as are thickened elongated she cannot trim the nail softener causing irritation. Denies any systemic complaints such as fevers, chills, nausea, vomiting. No acute changes since last appointment, and no other complaints at this time.   Objective: AAO x3, NAD DP/PT pulses palpable bilaterally, CRT less than 3 seconds The wounds to the posterior aspect the right heel as well as the right hallux appear to be healed.  There is no open lesions identified at this time. The nails are hypertrophic, dystrophic, discolored with yellow-brown discoloration.  There is tenderness nails 1-5 bilaterally.  No surrounding edema, erythema or any clinical signs of infection..  No open lesions or pre-ulcerative lesions.  No pain with calf compression, swelling, warmth, erythema  Assessment: Healed wounds right foot with symptomatic onychomycosis  Plan: -All treatment options discussed with the patient including all alternatives, risks, complications.  -This time the wounds appear to be healed.  Discussed measures to help prevent any recurrence including moisturizer to the heels.  This all started when she had dry skin that she tried to peel. -Nails were debrided x10 without any complications or bleeding. -Discussed the importance of the foot inspection -Follow-up in 3 months for routine care or sooner if needed. -Patient encouraged to call the office with any questions, concerns, change in symptoms.   Vivi Barrack DPM

## 2018-05-29 ENCOUNTER — Other Ambulatory Visit: Payer: Self-pay | Admitting: Gynecology

## 2018-06-28 ENCOUNTER — Other Ambulatory Visit: Payer: Self-pay | Admitting: Gynecology

## 2018-06-28 DIAGNOSIS — Z1231 Encounter for screening mammogram for malignant neoplasm of breast: Secondary | ICD-10-CM

## 2018-08-22 ENCOUNTER — Ambulatory Visit
Admission: RE | Admit: 2018-08-22 | Discharge: 2018-08-22 | Disposition: A | Discharge status: Home / Self Care | Payer: Medicare Other | Source: Ambulatory Visit | Attending: Gynecology | Admitting: Gynecology

## 2018-08-22 ENCOUNTER — Other Ambulatory Visit: Payer: Self-pay

## 2018-08-22 DIAGNOSIS — Z1231 Encounter for screening mammogram for malignant neoplasm of breast: Secondary | ICD-10-CM

## 2018-08-23 ENCOUNTER — Ambulatory Visit: Payer: Medicare Other

## 2018-08-30 ENCOUNTER — Ambulatory Visit: Payer: Medicare Other | Admitting: Podiatry

## 2018-10-28 ENCOUNTER — Emergency Department (HOSPITAL_COMMUNITY): Payer: Medicare Other

## 2018-10-28 ENCOUNTER — Other Ambulatory Visit: Payer: Self-pay

## 2018-10-28 ENCOUNTER — Emergency Department (HOSPITAL_COMMUNITY)
Admission: EM | Admit: 2018-10-28 | Discharge: 2018-10-28 | Disposition: A | Discharge status: Home / Self Care | Payer: Medicare Other | Attending: Emergency Medicine | Admitting: Emergency Medicine

## 2018-10-28 DIAGNOSIS — Q7292 Unspecified reduction defect of left lower limb: Secondary | ICD-10-CM

## 2018-10-28 DIAGNOSIS — Z7982 Long term (current) use of aspirin: Secondary | ICD-10-CM | POA: Diagnosis not present

## 2018-10-28 DIAGNOSIS — S62613B Displaced fracture of proximal phalanx of left middle finger, initial encounter for open fracture: Secondary | ICD-10-CM | POA: Insufficient documentation

## 2018-10-28 DIAGNOSIS — Q7192 Unspecified reduction defect of left upper limb: Secondary | ICD-10-CM | POA: Diagnosis not present

## 2018-10-28 DIAGNOSIS — Z79899 Other long term (current) drug therapy: Secondary | ICD-10-CM | POA: Diagnosis not present

## 2018-10-28 DIAGNOSIS — N183 Chronic kidney disease, stage 3 (moderate): Secondary | ICD-10-CM | POA: Insufficient documentation

## 2018-10-28 DIAGNOSIS — I129 Hypertensive chronic kidney disease with stage 1 through stage 4 chronic kidney disease, or unspecified chronic kidney disease: Secondary | ICD-10-CM | POA: Diagnosis not present

## 2018-10-28 DIAGNOSIS — S62619B Displaced fracture of proximal phalanx of unspecified finger, initial encounter for open fracture: Secondary | ICD-10-CM

## 2018-10-28 DIAGNOSIS — E039 Hypothyroidism, unspecified: Secondary | ICD-10-CM | POA: Diagnosis not present

## 2018-10-28 DIAGNOSIS — S61209A Unspecified open wound of unspecified finger without damage to nail, initial encounter: Secondary | ICD-10-CM

## 2018-10-28 DIAGNOSIS — Y9389 Activity, other specified: Secondary | ICD-10-CM | POA: Diagnosis not present

## 2018-10-28 DIAGNOSIS — Y9222 Religious institution as the place of occurrence of the external cause: Secondary | ICD-10-CM | POA: Insufficient documentation

## 2018-10-28 DIAGNOSIS — W010XXA Fall on same level from slipping, tripping and stumbling without subsequent striking against object, initial encounter: Secondary | ICD-10-CM | POA: Diagnosis not present

## 2018-10-28 DIAGNOSIS — W19XXXA Unspecified fall, initial encounter: Secondary | ICD-10-CM

## 2018-10-28 DIAGNOSIS — S6992XA Unspecified injury of left wrist, hand and finger(s), initial encounter: Secondary | ICD-10-CM | POA: Diagnosis present

## 2018-10-28 DIAGNOSIS — Y999 Unspecified external cause status: Secondary | ICD-10-CM | POA: Insufficient documentation

## 2018-10-28 LAB — CBC WITH DIFFERENTIAL/PLATELET
Abs Immature Granulocytes: 0.1 10*3/uL — ABNORMAL HIGH (ref 0.00–0.07)
Basophils Absolute: 0.1 10*3/uL (ref 0.0–0.1)
Basophils Relative: 1 %
Eosinophils Absolute: 0.4 10*3/uL (ref 0.0–0.5)
Eosinophils Relative: 3 %
HCT: 34.6 % — ABNORMAL LOW (ref 36.0–46.0)
Hemoglobin: 11.3 g/dL — ABNORMAL LOW (ref 12.0–15.0)
Immature Granulocytes: 1 %
Lymphocytes Relative: 31 %
Lymphs Abs: 4 10*3/uL (ref 0.7–4.0)
MCH: 31.5 pg (ref 26.0–34.0)
MCHC: 32.7 g/dL (ref 30.0–36.0)
MCV: 96.4 fL (ref 80.0–100.0)
Monocytes Absolute: 1.1 10*3/uL — ABNORMAL HIGH (ref 0.1–1.0)
Monocytes Relative: 8 %
Neutro Abs: 7.2 10*3/uL (ref 1.7–7.7)
Neutrophils Relative %: 56 %
Platelets: 273 10*3/uL (ref 150–400)
RBC: 3.59 MIL/uL — ABNORMAL LOW (ref 3.87–5.11)
RDW: 12.2 % (ref 11.5–15.5)
WBC: 12.9 10*3/uL — ABNORMAL HIGH (ref 4.0–10.5)
nRBC: 0 % (ref 0.0–0.2)

## 2018-10-28 LAB — BASIC METABOLIC PANEL
Anion gap: 11 (ref 5–15)
BUN: 26 mg/dL — ABNORMAL HIGH (ref 8–23)
CO2: 23 mmol/L (ref 22–32)
Calcium: 9.3 mg/dL (ref 8.9–10.3)
Chloride: 104 mmol/L (ref 98–111)
Creatinine, Ser: 1.6 mg/dL — ABNORMAL HIGH (ref 0.44–1.00)
GFR calc Af Amer: 36 mL/min — ABNORMAL LOW (ref >60)
GFR calc non Af Amer: 31 mL/min — ABNORMAL LOW (ref >60)
Glucose, Bld: 131 mg/dL — ABNORMAL HIGH (ref 70–99)
Potassium: 4 mmol/L (ref 3.5–5.1)
Sodium: 138 mmol/L (ref 135–145)

## 2018-10-28 MED ORDER — CEFAZOLIN SODIUM-DEXTROSE 1-4 GM/50ML-% IV SOLN
1.0000 g | Freq: Once | INTRAVENOUS | Status: AC
  Administered 2018-10-28: 1 g via INTRAVENOUS
  Filled 2018-10-28: qty 50

## 2018-10-28 MED ORDER — BUPIVACAINE HCL (PF) 0.5 % IJ SOLN
10.0000 mL | Freq: Once | INTRAMUSCULAR | Status: AC
  Administered 2018-10-28: 10 mL
  Filled 2018-10-28: qty 10

## 2018-10-28 MED ORDER — ACETAMINOPHEN 325 MG PO TABS: 650.0000 mg | ORAL_TABLET | Freq: Once | ORAL | Status: DC

## 2018-10-28 MED ORDER — CEPHALEXIN 500 MG PO CAPS: 500.0000 mg | ORAL_CAPSULE | Freq: Two times a day (BID) | ORAL | 0 refills | Status: AC

## 2018-10-28 NOTE — ED Provider Notes (Signed)
[facility] Note   CSN: [id number] Arrival date & time: 10/28/18  1333  History   Chief Complaint Chief Complaint  Patient presents with   Fall   Finger Injury   HPI Linda Lucas is a 73 y.o. female with past medical history past medical history significant for diabetes, depression, DDD, chronic urethritis, anxiety, hypercholesterolemia, hypertension presents for evaluation of mechanical fall.  States she was in church when she went to step up into a doorway in her shoe caught the lip which caused her to fall. Patient fell on her left upper extremity.  She denies hitting her head, LOC or anticoagulation. Patient with obvious deformity to her third digit on her left upper extremity. Rates pain a 10. Denies radiation of pain. Has mild tenderness to her distal forearm without obvious injury. Up-to-date tetanus. Was seen by Dr.Kuzma approximately 2 years ago for prior surgery.  History obtained from patient and past medical records.  No interpreter used.  Last PO intake 0830 this morning       HPI  Past Medical History:  Diagnosis Date   Ankle fracture    bimeolar fracture   Anxiety    Chronic urethritis    frequent uti's and or urethritis   Degenerative disc disease, lumbar 2007   bulging disc   Depression    Diabetes mellitus 2004   Fibromyalgia    Glaucoma    Hypercholesteremia    IBS (irritable bowel syndrome)    Metatarsal fracture    spiral fx left 5th metatarsal with non union requiring casting x 6 mos and electromagnetic therapy. results in reflx sympath dystrophy and fx of 3rd metatarsal after cast removed   Migraine    Osteoarthritis    Overactive bladder    Prolactin increased (HCC) 05/2002   NORMAL MRI    Patient Active Problem List   Diagnosis Date Noted   Acute infection of nasal sinus 03/15/2018   Yeast cystitis 03/14/2018   Decreased range of motion of finger of left hand 03/06/2017     Anxiety 02/15/2017   Dislocation, finger, interphalangeal joint 01/30/2017   Pain in finger of left hand 01/27/2017   Chronic kidney disease, stage III (moderate) (HCC) 08/13/2015   Primary open angle glaucoma of both eyes, severe stage 06/29/2015   Bronchitis 04/03/2015   Essential (primary) hypertension 04/03/2015   Fibromyositis 04/03/2015   Peptic esophagitis 04/03/2015   Hearing loss of both ears 04/03/2015   History of colonic polyps 04/03/2015   Hypothyroidism 04/03/2015   Incontinence of feces 04/03/2015   Migraine without aura and without status migrainosus, not intractable 04/03/2015   Pain in joint 04/03/2015   Recurrent major depressive episodes, in full remission (HCC) 04/03/2015   Disorder of rotator cuff 11/26/2014   Other specified postprocedural states 11/26/2014   Difficulty walking 06/19/2014   Lipodystrophy 02/13/2014   Abnormal gait 10/15/2013   At low risk for fall 10/15/2013   Dyspareunia 07/25/2011   Dysuria 07/25/2011   Urge incontinence 07/25/2011   Urinary urgency 07/25/2011   Overactive bladder    Migraine    Fibromyalgia    Chronic fatigue syndrome    Metatarsal fracture    Diabetes mellitus    Ankle fracture    Osteoarthritis    IBS (irritable bowel syndrome)    Depression    Chronic urethritis    Degenerative disc disease, lumbar    Hypercholesteremia    Prolactin increased (HCC)    Major depression in partial  remission ([place]) 01/04/2011   Restless legs syndrome 01/04/2011    Past Surgical History:  Procedure Laterality Date   ABDOMINAL HYSTERECTOMY     APPENDECTOMY  age 27   BREAST BIOPSY     x 2   BREAST EXCISIONAL BIOPSY Left 1994   BREAST EXCISIONAL BIOPSY Left 1994   BURCH PROCEDURE     CARPECTOMY HAND     LEFT FOR KEINBACH'S DZ   CATARACT EXTRACTION     CHOLECYSTECTOMY OPEN     WITH CDE   CYSTOSCOPY     X 4   FRACTURE METARSAL     FUSION OF DISCS     C 3-4 4-5 5-6  VERTBRAES IN NECK DUE TO DEGEN. DISC DZ   FUSION OF LEFT WRIST WITH PLATE AND SCREWS  4782   CADAVER BONE GRAFT AND APPLICATION OF HUMAN GROWTH FACTORS   HEMORROIDECTOMY  age 87   HYSTEROSCOPY     D&C,HYSTEROSCOPY FOR PED FIBROID    INTERSTIM IMPLANT PLACEMENT     ORIF RADIAL HEAD / NECK FRACTURE  2010   RIGHT RADIAL HED EXTENDING INTO JOINT SPACE WITH PLATE AND SCREWS   PERCUTANEOUS PINNING Left 01/31/2017   Procedure: PERCUTANEOUS PINNING EXTREMITYLEFT SMALL FINGER;  Surgeon: Daryll Brod, MD;  Location: [facility];  Service: Orthopedics;  Laterality: Left;   RIGHT ANKLE FRACTURE     ROTATOR CUFF REPAIR     TONSILLECTOMY AND ADENOIDECTOMY     TUBAL LIGATION       OB History    Gravida  2   Para  2   Term  2   Preterm      AB  0   Living  1     SAB      TAB      Ectopic      Multiple      Live Births              Home Medications    Prior to Admission medications   Medication Sig Start Date End Date Taking? Authorizing Provider  ARIPiprazole (ABILIFY) 5 MG tablet Take by mouth. 05/17/17   [provider]  aspirin 81 MG tablet Take 81 mg by mouth daily.     [provider]  atorvastatin (LIPITOR) 40 MG tablet Take 40 mg by mouth daily.      [provider]  brimonidine-timolol (COMBIGAN) 0.2-0.5 % ophthalmic solution Place 1 drop into both eyes 2 times daily. 02/13/13   [provider]  calcium citrate-vitamin D (CITRACAL+D) 315-200 MG-UNIT tablet Take 1 tablet by mouth daily.     [provider]  cephALEXin (KEFLEX) 500 MG capsule Take 1 capsule (500 mg total) by mouth 2 (two) times daily for 5 days. 10/28/18 11/02/18  Disaya Walt A, PA-C  clonazePAM (KLONOPIN) 0.5 MG tablet Take 0.5 mg by mouth 2 (two) times daily. Takes at night time 05/17/17   [provider]  conjugated estrogens (PREMARIN) vaginal cream INSERT 1 APPLICATORFUL  VAGINALLY 2 TIMES A WEEK. 05/29/18   Fontaine, Belinda Block, MD  DiphenhydrAMINE HCl (BENADRYL PO) Take by mouth daily as needed.     [provider]  dorzolamide (TRUSOPT) 2 % ophthalmic solution Place 1 drop into both eyes 2 times daily. 10/11/14   [provider]  doxycycline (VIBRA-TABS) 100 MG tablet Take 1 tablet (100 mg total) by mouth 2 (two) times daily. 04/03/18   Trula Slade, DPM  FIBER PO Take by mouth.  [provider]  HYDROmorphone (DILAUDID) 4 MG tablet Take 4 mg by mouth. Takes initially for breakthrough pain 02/02/15   [provider]  HYDROmorphone HCl 16 MG T24A Take by mouth. 01/23/15   [provider]  latanoprost (XALATAN) 0.005 % ophthalmic solution Place 1 drop into both eyes 2 (two) times daily.    [provider]  levothyroxine (SYNTHROID) 100 MCG tablet Take 100 mcg by mouth daily.      [provider]  liraglutide (VICTOZA) 18 MG/3ML SOPN Inject into the skin.    [provider]  lisinopril (PRINIVIL,ZESTRIL) 10 MG tablet Take 10 mg by mouth daily.    [provider]  loratadine (CLARITIN) 10 MG tablet Take 10 mg by mouth daily.    [provider]  Multiple Vitamin (MULTIVITAMIN) tablet Take 1 tablet by mouth daily.    [provider]  mupirocin ointment (BACTROBAN) 2 % Apply 1 application topically 2 (two) times daily. 03/15/18   Vivi BarrackWagoner, Matthew R, DPM  NADOLOL PO Take 20 mg by mouth. Takes at bedtime    [provider]  omeprazole (PRILOSEC) 20 MG capsule Take 20 mg by mouth daily.    [provider]  oxybutynin (DITROPAN-XL) 10 MG 24 hr tablet Take 10 mg by mouth 2 (two) times daily.     [provider]  promethazine (PHENERGAN) 25 MG tablet Take 25 mg by mouth every 6 (six) hours as needed.      [provider]  repaglinide (PRANDIN) 2 MG tablet Take 2 mg by mouth. Takes 2 in the morning Takes 2 before supper 06/21/17   [provider]  rOPINIRole (REQUIP) 0.5 MG tablet Take  0.5 mg by mouth 3 (three) times daily. Takes at night time    [provider]  Suvorexant (BELSOMRA) 20 MG TABS Take 1 tablet by mouth. Takes at bedtime every night    [provider]  trimethoprim (TRIMPEX) 100 MG tablet Take 100 mg by mouth 2 (two) times daily.    [provider]  Venlafaxine HCl (EFFEXOR PO) Take by mouth 2 (two) times daily. Takes 200 mg in the morning Takes 75 mg at night time    [provider]    Family History Family History  Problem Relation Age of Onset   Other Daughter        needle stick at hospital died of AIDS and hepatiitis   Diabetes Mother    Hypertension Mother    Heart disease Mother    Heart attack Mother    Diabetes Father    Heart disease Father    Hypertension Father    Rheumatic fever Father    Diabetes Sister    Osteoarthritis Sister    Heart attack Sister    Diabetes Brother    Heart attack Brother    Cancer Maternal Grandmother        OVARIAN OR [place]   Other Son        paralyzed after motorcycle accident   Social History Social History   Tobacco Use   Smoking status: Never Smoker   Smokeless tobacco: Never Used  Substance Use Topics   Alcohol use: No    Alcohol/week: 0.0 standard drinks   Drug use: No   Allergies   Adhesive [tape], Aspirin, Jardiance [empagliflozin], Oxycodone-acetaminophen, Oxymorphone hcl, and Ivp dye [iodinated diagnostic agents]  Review of Systems Review of Systems  Constitutional: Negative.   HENT: Negative.   Respiratory: Negative.   Cardiovascular: Negative.  Gastrointestinal: Negative.   Genitourinary: Negative.   Musculoskeletal:       Pain to left third digit  Skin: Positive for wound.  All other systems reviewed and are negative.  Physical Exam Updated Vital Signs BP (!) 121/92    Pulse 80    Temp 98.5 F (36.9 C) (Oral)    Resp 20    SpO2 96%   Physical Exam Vitals signs and nursing note reviewed.  Constitutional:       General: She is not in acute distress.    Appearance: She is well-developed.  HENT:     Head: Normocephalic and atraumatic.     Jaw: There is normal jaw occlusion.     Comments: No horizontal, vertical or rotational nystagmus     Nose: Nose normal.     Mouth/Throat:     Lips: Pink.     Mouth: Mucous membranes are moist.     Pharynx: Oropharynx is clear.  Eyes:     Pupils: Pupils are equal, round, and reactive to light.  Neck:     Musculoskeletal: Full passive range of motion without pain and normal range of motion.     Trachea: Trachea and phonation normal.     Comments: No neck stiffness or neck rigidity Cardiovascular:     Rate and Rhythm: Normal rate.  Pulmonary:     Effort: Pulmonary effort is normal. No respiratory distress.     Breath sounds: Normal breath sounds.  Abdominal:     General: Abdomen is flat. Bowel sounds are normal. There is no distension.  Musculoskeletal: Normal range of motion.     Comments: Dislocation at PIP to third digit at left upper extremity.  Exposed tendon.  Unable to flex and extend due to possible dislocation.  Overlying 2 cm laceration on the palmar aspect of the PIP joint.  Pelvis stable to palpation without tenderness.  No shortening or rotation of legs.  Moves all 4 extremities without difficulty except for third digit.  Skin:    General: Skin is warm and dry.     Capillary Refill: Capillary refill takes less than 2 seconds.     Comments: 2 cm laceration over palmar aspect of PIP to third digit of left upper extremity.  No bleeding or drainage.  Neurological:     Mental Status: She is alert.     Comments: Intact sensation to dull however not sharp to DIP.  Mental Status:  Alert, oriented, thought content appropriate. Speech fluent without evidence of aphasia. Able to follow 2 step commands without difficulty.  Cranial Nerves:  II:  Peripheral visual fields grossly normal, pupils equal, round, reactive to light III,IV, VI: ptosis not present,  extra-ocular motions intact bilaterally  V,VII: smile symmetric, facial light touch sensation equal VIII: hearing grossly normal bilaterally  IX,X: midline uvula rise  XI: bilateral shoulder shrug equal and strong XII: midline tongue extension  Motor:  5/5 in upper and lower extremities bilaterally including strong and equal grip strength and dorsiflexion/plantar flexion. Limited strength to 3rd digit to LUE secondary to dislocation. Sensory: Pinprick and light touch normal in all extremities except 3rd digit on LUE. Deep Tendon Reflexes: 2+ and symmetric  Cerebellar: normal finger-to-nose with bilateral upper extremities Gait: normal gait and balance CV: distal pulses palpable throughout           ED Treatments / Results  Labs (all labs ordered are listed, but only abnormal results are displayed) Labs Reviewed  CBC WITH DIFFERENTIAL/PLATELET - Abnormal; Notable for  the following components:      Result Value   WBC 12.9 (*)    RBC 3.59 (*)    Hemoglobin 11.3 (*)    HCT 34.6 (*)    Monocytes Absolute 1.1 (*)    Abs Immature Granulocytes 0.10 (*)    All other components within normal limits  BASIC METABOLIC PANEL - Abnormal; Notable for the following components:   Glucose, Bld 131 (*)    BUN 26 (*)    Creatinine, Ser 1.60 (*)    GFR calc non Af Amer 31 (*)    GFR calc Af Amer 36 (*)    All other components within normal limits    EKG None  Radiology Dg Forearm Left  Result Date: 10/28/2018 CLINICAL DATA:  Pain EXAM: LEFT FOREARM - 2 VIEW COMPARISON:  None. FINDINGS: Patient is status post plate and screw fusion across the wrist the of the third metacarpal. There are advanced degenerative changes of the wrist. The hardware appears grossly intact. There is no evidence for an acute displaced fracture or dislocation. IMPRESSION: No acute displaced fracture or dislocation. Electronically Signed   By: Katherine Mantlehristopher  Green M.D.   On: 10/28/2018 15:14   Dg Finger Middle  Left  Result Date: 10/28/2018 CLINICAL DATA:  Possible open fracture EXAM: LEFT MIDDLE FINGER 2+V COMPARISON:  None. FINDINGS: There is an acute appearing dislocation of the proximal interphalangeal joint. There is a small pocket of gas adjacent to the base of the middle phalanx. There is a mildly displaced intra-articular fracture involving the dorsal base of the middle phalanx. There is surrounding soft tissue swelling. The patient appears to be status post prior plate screw fixation of the third metacarpal. IMPRESSION: 1. Acute dislocation of the proximal interphalangeal joint of the third digit. 2. Acute mildly displaced intra-articular fracture involving the dorsal base of the middle phalanx. 3. Small pockets of gas adjacent to the base of the middle phalanx. This may be secondary to a laceration at this level. Electronically Signed   By: Katherine Mantlehristopher  Green M.D.   On: 10/28/2018 15:13    Procedures Procedures (including critical care time)  Medications Ordered in ED Medications  bupivacaine (MARCAINE) 0.5 % injection 10 mL (has no administration in time range)  ceFAZolin (ANCEF) IVPB 1 g/50 mL premix (1 g Intravenous New Bag/Given 10/28/18 1523)   Initial Impression / Assessment and Plan / ED Course  I have reviewed the triage vital signs and the nursing notes.  Pertinent labs & imaging results that were available during my care of the patient were reviewed by me and considered in my medical decision making (see chart for details).  73 year old appears otherwise well presents for evaluation of mechanical fall which occurred just PTA.  She denies hitting her head, LOC or anticoagulation.  Patient with obvious deformity with exposed tendon to third digit on left upper extremity.  Her tetanus is up-to-date.  Nonfocal neurologic exam without neurologic deficits.  She is ambulatory in room upon my initial evaluation.  Nontender to bilateral hips and pelvis.  Discussed x-ray of pelvis to ensure no  pelvic fracture however patient states she has no pain and she "feels no need for this."  Given she is ambulatory I feel this is reasonable.  Do not feel need to image her head as she did not hit her head, LOC or is not on anticoagulation.  Fall purely mechanical.  Will give dose of Ancef for possible open dislocation to third digit on left upper extremity.  Will obtain baseline labs.  Plan to consult with hand surgery given open dislocation.  Labs and imagine personally reviewed.  1520: Patient does not want ED providers to attempt reduction of finger dislocation. Obvious tendon injury. With open dislocation with fracture. Will consult with hand surgery.   1550: Consulted with hand surgery, Dr. Aundria Rud who recommends digital block, reduction of fracture, thorough cleaning, Keflex for 5 days outpatient, finger splint and patient to follow-up outpatient  Care transferred to Dr. Fredrik Cove who will perform digital block and reduction of finger and EKG.  Patient has been discussed with attending physician Dr. Dalene Seltzer who agrees with above treatment, plan and disposition.      Final Clinical Impressions(s) / ED Diagnoses   Final diagnoses:  Fall, initial encounter  Open dislocation of finger, initial encounter  Open fracture of proximal phalanx of digit of left hand    ED Discharge Orders         Ordered    cephALEXin (KEFLEX) 500 MG capsule  2 times daily     10/28/18 1605           Pritika Alvarez A, PA-C 10/28/18 1608    Alvira Monday, MD 10/30/18 1615

## 2018-10-28 NOTE — ED Provider Notes (Signed)
Linda assumed from [place] at 85:79 PM.  73 year old patient who had a [place] injury to the left hand after tripping.  Dislocation of the PIP of the left middle finger.  Open fracture.  Exposed tendon.  She is right-handed.  Has received tetanus and Ancef.  Dr. Stann Mainland from orthopedics on call for hand surgery.  He recommends digital Lucas, Linda at bedside by ED staff.  Suture for closure.  Prophylactic Keflex.  Finger splint.  Follow-up in clinic with himself or Dr. Fredna Dow in 1 week.  Marland Kitchen.Laceration Repair  Date/Time: 10/28/2018 6:06 PM Performed by: Tillie Fantasia, MD Authorized by: Elnora Morrison, MD   Consent:    Consent obtained:  Verbal and written   Consent given by:  Patient   Risks discussed:  Infection, need for additional repair, nerve damage, pain, poor cosmetic result, poor wound healing, retained foreign body, tendon damage and vascular damage   Alternatives discussed:  No treatment, delayed treatment, referral and observation Anesthesia (see MAR for exact dosages):    Anesthesia method:  Nerve Lucas   Lucas location:  3rd finger base   Lucas needle gauge:  25 G   Lucas anesthetic:  Bupivacaine 0.5% w/o epi   Lucas technique:  Ring Lucas   Lucas injection procedure:  Anatomic landmarks identified, introduced needle, incremental injection, negative aspiration for blood and anatomic landmarks palpated   Lucas outcome:  Anesthesia achieved Laceration details:    Location:  Finger   Finger location:  L long finger   Length (cm):  1.5 Pre-procedure details:    Preparation:  Patient was prepped and draped in usual sterile fashion and imaging obtained to evaluate for foreign bodies Exploration:    Hemostasis achieved with:  Direct pressure   Contaminated: no   Treatment:    Area cleansed with:  Betadine   Amount of cleaning:  Standard   Irrigation solution:  Sterile saline   Irrigation volume:  200   Irrigation method:  Syringe   Visualized foreign bodies/material  removed: no   Skin repair:    Repair method:  Sutures   Suture size:  4-0   Suture material:  Prolene   Suture technique:  Simple interrupted   Number of sutures:  4 Approximation:    Approximation:  Close Post-procedure details:    Dressing:  Non-adherent dressing and splint for protection   Patient tolerance of procedure:  Tolerated well, no immediate complications Linda of dislocation  Date/Time: 10/28/2018 6:09 PM Performed by: Tillie Fantasia, MD Authorized by: Elnora Morrison, MD  Consent: Verbal consent obtained. Written consent obtained. Risks and benefits: risks, benefits and alternatives were discussed Consent given by: patient Patient understanding: patient states understanding of the procedure being performed Patient consent: the patient's understanding of the procedure matches consent given Relevant documents: relevant documents present and verified Imaging studies: imaging studies available Required items: required blood products, implants, devices, and special equipment available Patient identity confirmed: arm band Time out: Immediately prior to procedure a "time out" was called to verify the correct patient, procedure, equipment, support staff and site/side marked as required. Preparation: Patient was prepped and draped in the usual sterile fashion. Local anesthesia used: Yes ring Lucas as outlined in lac repair.  Anesthesia: Local anesthesia used: Yes ring Lucas as outlined in lac repair.  Sedation: Patient sedated: no  Patient tolerance: patient tolerated the procedure well with no immediate complications     Ring Lucas performed followed by Linda.  X-ray confirmed Linda of dislocation.  There is persistent  fracture fragment.  Proceeded with laceration repair as above.  She was placed in an AlumaFoam splint with Coban around the finger and hand.  Given strict return precautions regarding aftercare instructions and return precautions.  Gave Keflex  prophylaxis as above with instructions to follow-up with a specialist in 1 week.  She and her husband who is at bedside vocalized understanding and agreed with plan.  Had no other questions or concerns.  She was discharged in good condition.      Jaclynn MajorStarnes, Elzena Muston, MD 10/28/18 Michaelene [id number]    Blane OharaZavitz, Joshua, MD 10/29/18 [phone]

## 2018-10-28 NOTE — ED Notes (Signed)
Patient verbalized understanding of discharge instructions. Opportunities for questioning and answers were provided. Armband removed by staff. Patient removed from ED.   

## 2018-10-28 NOTE — ED Triage Notes (Signed)
[facility] reports pt had mechanical fall at church. Finger on left hand has been cleaned with sterile liquid and wrapped. Pt vitals stable.  Pt is requesting a hand sx.

## 2018-10-29 ENCOUNTER — Other Ambulatory Visit: Payer: Self-pay

## 2018-10-29 ENCOUNTER — Other Ambulatory Visit: Payer: Self-pay | Admitting: Orthopedic Surgery

## 2018-10-29 ENCOUNTER — Encounter (HOSPITAL_BASED_OUTPATIENT_CLINIC_OR_DEPARTMENT_OTHER): Payer: Self-pay | Admitting: *Deleted

## 2018-10-30 ENCOUNTER — Other Ambulatory Visit: Payer: Self-pay

## 2018-10-30 ENCOUNTER — Ambulatory Visit (HOSPITAL_BASED_OUTPATIENT_CLINIC_OR_DEPARTMENT_OTHER)
Admission: RE | Admit: 2018-10-30 | Discharge: 2018-10-30 | Disposition: A | Discharge status: Home / Self Care | Payer: Medicare Other | Attending: Orthopedic Surgery | Admitting: Orthopedic Surgery

## 2018-10-30 ENCOUNTER — Ambulatory Visit (HOSPITAL_BASED_OUTPATIENT_CLINIC_OR_DEPARTMENT_OTHER): Payer: Medicare Other | Admitting: Certified Registered Nurse Anesthetist

## 2018-10-30 ENCOUNTER — Other Ambulatory Visit (HOSPITAL_COMMUNITY)
Admission: RE | Admit: 2018-10-30 | Discharge: 2018-10-30 | Disposition: A | Discharge status: Home / Self Care | Payer: Medicare Other | Source: Ambulatory Visit | Attending: Orthopedic Surgery | Admitting: Orthopedic Surgery

## 2018-10-30 ENCOUNTER — Encounter (HOSPITAL_BASED_OUTPATIENT_CLINIC_OR_DEPARTMENT_OTHER)
Admission: RE | Disposition: A | Discharge status: Home / Self Care | Payer: Self-pay | Source: Home / Self Care | Attending: Orthopedic Surgery

## 2018-10-30 ENCOUNTER — Encounter (HOSPITAL_BASED_OUTPATIENT_CLINIC_OR_DEPARTMENT_OTHER): Payer: Self-pay | Admitting: Certified Registered Nurse Anesthetist

## 2018-10-30 DIAGNOSIS — Z20828 Contact with and (suspected) exposure to other viral communicable diseases: Secondary | ICD-10-CM | POA: Diagnosis not present

## 2018-10-30 DIAGNOSIS — M797 Fibromyalgia: Secondary | ICD-10-CM | POA: Diagnosis not present

## 2018-10-30 DIAGNOSIS — S63283A Dislocation of proximal interphalangeal joint of left middle finger, initial encounter: Secondary | ICD-10-CM | POA: Diagnosis present

## 2018-10-30 DIAGNOSIS — S62623A Displaced fracture of medial phalanx of left middle finger, initial encounter for closed fracture: Secondary | ICD-10-CM | POA: Diagnosis not present

## 2018-10-30 DIAGNOSIS — Z886 Allergy status to analgesic agent status: Secondary | ICD-10-CM | POA: Insufficient documentation

## 2018-10-30 DIAGNOSIS — E7800 Pure hypercholesterolemia, unspecified: Secondary | ICD-10-CM | POA: Diagnosis not present

## 2018-10-30 DIAGNOSIS — K219 Gastro-esophageal reflux disease without esophagitis: Secondary | ICD-10-CM | POA: Diagnosis not present

## 2018-10-30 DIAGNOSIS — Y9222 Religious institution as the place of occurrence of the external cause: Secondary | ICD-10-CM | POA: Diagnosis not present

## 2018-10-30 DIAGNOSIS — Z888 Allergy status to other drugs, medicaments and biological substances status: Secondary | ICD-10-CM | POA: Insufficient documentation

## 2018-10-30 DIAGNOSIS — F419 Anxiety disorder, unspecified: Secondary | ICD-10-CM | POA: Diagnosis not present

## 2018-10-30 DIAGNOSIS — W010XXA Fall on same level from slipping, tripping and stumbling without subsequent striking against object, initial encounter: Secondary | ICD-10-CM | POA: Insufficient documentation

## 2018-10-30 DIAGNOSIS — E119 Type 2 diabetes mellitus without complications: Secondary | ICD-10-CM | POA: Diagnosis not present

## 2018-10-30 DIAGNOSIS — I10 Essential (primary) hypertension: Secondary | ICD-10-CM | POA: Insufficient documentation

## 2018-10-30 DIAGNOSIS — H409 Unspecified glaucoma: Secondary | ICD-10-CM | POA: Insufficient documentation

## 2018-10-30 DIAGNOSIS — E039 Hypothyroidism, unspecified: Secondary | ICD-10-CM | POA: Insufficient documentation

## 2018-10-30 DIAGNOSIS — Z885 Allergy status to narcotic agent status: Secondary | ICD-10-CM | POA: Insufficient documentation

## 2018-10-30 DIAGNOSIS — M1990 Unspecified osteoarthritis, unspecified site: Secondary | ICD-10-CM | POA: Diagnosis not present

## 2018-10-30 DIAGNOSIS — N289 Disorder of kidney and ureter, unspecified: Secondary | ICD-10-CM | POA: Insufficient documentation

## 2018-10-30 DIAGNOSIS — F329 Major depressive disorder, single episode, unspecified: Secondary | ICD-10-CM | POA: Diagnosis not present

## 2018-10-30 HISTORY — PX: REPAIR EXTENSOR TENDON: SHX5382

## 2018-10-30 HISTORY — PX: WOUND EXPLORATION: SHX6188

## 2018-10-30 HISTORY — PX: OPEN REDUCTION INTERNAL FIXATION (ORIF) PROXIMAL PHALANX: SHX6235

## 2018-10-30 LAB — SARS CORONAVIRUS 2 BY RT PCR (HOSPITAL ORDER, PERFORMED IN ~~LOC~~ HOSPITAL LAB): SARS Coronavirus 2: NEGATIVE

## 2018-10-30 LAB — GLUCOSE, CAPILLARY
Glucose-Capillary: 167 mg/dL — ABNORMAL HIGH (ref 70–99)
Glucose-Capillary: 181 mg/dL — ABNORMAL HIGH (ref 70–99)

## 2018-10-30 SURGERY — WOUND EXPLORATION
Anesthesia: Monitor Anesthesia Care | Site: Finger | Laterality: Left

## 2018-10-30 MED ORDER — BUPIVACAINE-EPINEPHRINE (PF) 0.5% -1:200000 IJ SOLN
INTRAMUSCULAR | Status: DC | PRN
  Administered 2018-10-30: 30 mL via PERINEURAL

## 2018-10-30 MED ORDER — HYDROCODONE-ACETAMINOPHEN 5-325 MG PO TABS: 1.0000 | ORAL_TABLET | Freq: Four times a day (QID) | ORAL | 0 refills | Status: DC | PRN

## 2018-10-30 MED ORDER — LIDOCAINE 2% (20 MG/ML) 5 ML SYRINGE
INTRAMUSCULAR | Status: DC | PRN
  Administered 2018-10-30: 40 mg via INTRAVENOUS

## 2018-10-30 MED ORDER — PROPOFOL 10 MG/ML IV BOLUS
INTRAVENOUS | Status: DC | PRN
  Administered 2018-10-30: 20 mg via INTRAVENOUS

## 2018-10-30 MED ORDER — CEFAZOLIN SODIUM-DEXTROSE 2-4 GM/100ML-% IV SOLN
INTRAVENOUS | Status: AC
  Filled 2018-10-30: qty 100

## 2018-10-30 MED ORDER — LACTATED RINGERS IV SOLN
INTRAVENOUS | Status: DC
  Administered 2018-10-30: 13:00:00 via INTRAVENOUS

## 2018-10-30 MED ORDER — FENTANYL CITRATE (PF) 100 MCG/2ML IJ SOLN: 25.0000 ug | INTRAMUSCULAR | Status: DC | PRN

## 2018-10-30 MED ORDER — CEFAZOLIN SODIUM-DEXTROSE 2-4 GM/100ML-% IV SOLN
2.0000 g | INTRAVENOUS | Status: AC
  Administered 2018-10-30: 2 g via INTRAVENOUS

## 2018-10-30 MED ORDER — ONDANSETRON HCL 4 MG/2ML IJ SOLN: 4.0000 mg | Freq: Once | INTRAMUSCULAR | Status: DC | PRN

## 2018-10-30 MED ORDER — MIDAZOLAM HCL 2 MG/2ML IJ SOLN
1.0000 mg | INTRAMUSCULAR | Status: DC | PRN
  Administered 2018-10-30: 13:00:00 1 mg via INTRAVENOUS

## 2018-10-30 MED ORDER — PROPOFOL 500 MG/50ML IV EMUL
INTRAVENOUS | Status: DC | PRN
  Administered 2018-10-30: 75 ug/kg/min via INTRAVENOUS

## 2018-10-30 MED ORDER — BUPIVACAINE HCL (PF) 0.25 % IJ SOLN
INTRAMUSCULAR | Status: DC | PRN
  Administered 2018-10-30: 7 mL

## 2018-10-30 MED ORDER — CHLORHEXIDINE GLUCONATE 4 % EX LIQD: 60.0000 mL | Freq: Once | CUTANEOUS | Status: DC

## 2018-10-30 MED ORDER — 0.9 % SODIUM CHLORIDE (POUR BTL) OPTIME
TOPICAL | Status: DC | PRN
  Administered 2018-10-30: 120 mL

## 2018-10-30 MED ORDER — FENTANYL CITRATE (PF) 100 MCG/2ML IJ SOLN
INTRAMUSCULAR | Status: AC
  Filled 2018-10-30: qty 2

## 2018-10-30 MED ORDER — ONDANSETRON HCL 4 MG/2ML IJ SOLN
INTRAMUSCULAR | Status: DC | PRN
  Administered 2018-10-30: 4 mg via INTRAVENOUS

## 2018-10-30 MED ORDER — MIDAZOLAM HCL 2 MG/2ML IJ SOLN
INTRAMUSCULAR | Status: AC
  Filled 2018-10-30: qty 2

## 2018-10-30 MED ORDER — FENTANYL CITRATE (PF) 100 MCG/2ML IJ SOLN
50.0000 ug | INTRAMUSCULAR | Status: DC | PRN
  Administered 2018-10-30: 50 ug via INTRAVENOUS

## 2018-10-30 SURGICAL SUPPLY — 59 items
APL PRP STRL LF DISP 70% ISPRP (MISCELLANEOUS) ×1
BLADE MINI RND TIP GREEN BEAV (BLADE) IMPLANT
BLADE SURG 15 STRL LF DISP TIS (BLADE) ×1 IMPLANT
BLADE SURG 15 STRL SS (BLADE) ×2
BNDG CMPR 9X4 STRL LF SNTH (GAUZE/BANDAGES/DRESSINGS) ×1
BNDG COHESIVE 1X5 TAN STRL LF (GAUZE/BANDAGES/DRESSINGS) ×1 IMPLANT
BNDG COHESIVE 3X5 TAN STRL LF (GAUZE/BANDAGES/DRESSINGS) ×2 IMPLANT
BNDG ESMARK 4X9 LF (GAUZE/BANDAGES/DRESSINGS) ×2 IMPLANT
BNDG GAUZE ELAST 4 BULKY (GAUZE/BANDAGES/DRESSINGS) ×1 IMPLANT
CHLORAPREP W/TINT 26 (MISCELLANEOUS) ×2 IMPLANT
CORD BIPOLAR FORCEPS 12FT (ELECTRODE) ×2 IMPLANT
COVER BACK TABLE REUSABLE LG (DRAPES) ×2 IMPLANT
COVER MAYO STAND REUSABLE (DRAPES) ×2 IMPLANT
COVER WAND RF STERILE (DRAPES) IMPLANT
CUFF TOURN SGL QUICK 18X4 (TOURNIQUET CUFF) ×1 IMPLANT
DECANTER SPIKE VIAL GLASS SM (MISCELLANEOUS) IMPLANT
DRAPE EXTREMITY T 121X128X90 (DISPOSABLE) ×2 IMPLANT
DRAPE OEC MINIVIEW 54X84 (DRAPES) ×2 IMPLANT
DRAPE SURG 17X23 STRL (DRAPES) ×2 IMPLANT
GAUZE SPONGE 4X4 12PLY STRL (GAUZE/BANDAGES/DRESSINGS) ×2 IMPLANT
GAUZE XEROFORM 1X8 LF (GAUZE/BANDAGES/DRESSINGS) ×2 IMPLANT
GLOVE BIO SURGEON STRL SZ7.5 (GLOVE) ×1 IMPLANT
GLOVE BIOGEL PI IND STRL 7.0 (GLOVE) IMPLANT
GLOVE BIOGEL PI IND STRL 8 (GLOVE) IMPLANT
GLOVE BIOGEL PI IND STRL 8.5 (GLOVE) ×1 IMPLANT
GLOVE BIOGEL PI INDICATOR 7.0 (GLOVE) ×2
GLOVE BIOGEL PI INDICATOR 8 (GLOVE) ×2
GLOVE BIOGEL PI INDICATOR 8.5 (GLOVE) ×1
GLOVE ECLIPSE 8.0 STRL XLNG CF (GLOVE) ×1 IMPLANT
GLOVE SURG ORTHO 8.0 STRL STRW (GLOVE) ×3 IMPLANT
GOWN STRL REUS W/ TWL LRG LVL3 (GOWN DISPOSABLE) IMPLANT
GOWN STRL REUS W/TWL LRG LVL3 (GOWN DISPOSABLE)
GOWN STRL REUS W/TWL XL LVL3 (GOWN DISPOSABLE) ×3 IMPLANT
K-WIRE .035X4 (WIRE) ×1 IMPLANT
NDL PRECISIONGLIDE 27X1.5 (NEEDLE) IMPLANT
NEEDLE PRECISIONGLIDE 27X1.5 (NEEDLE) ×2 IMPLANT
NS IRRIG 1000ML POUR BTL (IV SOLUTION) ×2 IMPLANT
PACK BASIN DAY SURGERY FS (CUSTOM PROCEDURE TRAY) ×2 IMPLANT
PAD CAST 3X4 CTTN HI CHSV (CAST SUPPLIES) ×1 IMPLANT
PADDING CAST ABS 4INX4YD NS (CAST SUPPLIES) ×1
PADDING CAST ABS COTTON 4X4 ST (CAST SUPPLIES) ×1 IMPLANT
PADDING CAST COTTON 3X4 STRL (CAST SUPPLIES) ×2
SLEEVE SCD COMPRESS KNEE MED (MISCELLANEOUS) ×1 IMPLANT
SLING ARM FOAM STRAP LRG (SOFTGOODS) ×1 IMPLANT
SPLINT FINGER 3.25 911903 (SOFTGOODS) ×1 IMPLANT
SPLINT PLASTER CAST XFAST 3X15 (CAST SUPPLIES) IMPLANT
SPLINT PLASTER XTRA FASTSET 3X (CAST SUPPLIES)
STOCKINETTE 4X48 STRL (DRAPES) ×2 IMPLANT
SUT CHROMIC 5 0 P 3 (SUTURE) IMPLANT
SUT ETHILON 4 0 PS 2 18 (SUTURE) ×2 IMPLANT
SUT MERSILENE 4 0 P 3 (SUTURE) IMPLANT
SUT MERSILENE 5 0 P 3 (SUTURE) ×1 IMPLANT
SUT VICRYL 4-0 PS2 18IN ABS (SUTURE) IMPLANT
SWAB COLLECTION DEVICE MRSA (MISCELLANEOUS) ×1 IMPLANT
SWAB CULTURE ESWAB REG 1ML (MISCELLANEOUS) ×1 IMPLANT
SYR BULB 3OZ (MISCELLANEOUS) ×2 IMPLANT
SYR CONTROL 10ML LL (SYRINGE) ×1 IMPLANT
TOWEL GREEN STERILE FF (TOWEL DISPOSABLE) ×2 IMPLANT
UNDERPAD 30X30 (UNDERPADS AND DIAPERS) ×2 IMPLANT

## 2018-10-30 NOTE — Op Note (Addendum)
NAME: Linda Lucas MEDICAL RECORD NO: [id number] DATE OF BIRTH: 10/01/1945 FACILITY: [facility] LOCATION: [facility] PHYSICIAN: Wynonia Sours, MD   OPERATIVE REPORT   DATE OF PROCEDURE: 10/30/18    PREOPERATIVE DIAGNOSIS:   Dislocation PIP joint left middle finger dorsal ramus fracture middle phalanx proximal articular surface   POSTOPERATIVE DIAGNOSIS:   Same   PROCEDURE:   Open reduction debridement of PIP dislocation with repair dorsal fracture central slip evulsion left middle finger   SURGEON: Daryll Brod, M.D.   ASSISTANT: Leanora Cover, MD   ANESTHESIA:  Regional with sedation and Local   INTRAVENOUS FLUIDS:  Per anesthesia flow sheet.   ESTIMATED BLOOD LOSS:  Minimal.   COMPLICATIONS:  None.   SPECIMENS:   Cultures   TOURNIQUET TIME:    Total Tourniquet Time Documented: Upper Arm (Left) - 30 minutes Total: Upper Arm (Left) - 30 minutes    DISPOSITION:  Stable to PACU.   INDICATIONS: Patient is a 73 year old female who slipped and fell while at church sustaining a dislocation with laceration of the palmar aspect of her left middle finger was seen in the emergency room where this was reduced she was placed on antibiotics after IV antibiotics were given.  She was referred.  She is seen in the office with a swan-neck deformity of her finger and and a evulsion of dorsal rim of her middle phalanx.  Dislocation has been reduced.  She is admitted for open reduction irrigation repair of dorsal rim fracture left middle finger.  She is aware that there is no guarantee to the surgery the possibility of infection recurrence injury to arteries nerves tendons complete relief symptoms just possibility of stiffness.  In the preoperative area the patient is seen the extremity marked with both patient and surgeon antibiotic given.  A supraclavicular block was carried out without difficulty under the direction of the anesthesia department.  OPERATIVE COURSE: She is brought to  the operating room where she was placed in a supine position prepped and draped with the left arm free.  ChloraPrep was used and a three-minute dry time and timeout taken to confirm patient procedure.  The limb was exsanguinated with an Esmarch bandage turn placed high in the arm was inflated to 250 mmHg.  The sutures were removed the area open to a incision made extending the incision proximally and oblique manner.  Cultures were taken for both aerobic and anaerobic bacteria.  The rupture of the flexor sheath was immediately apparent the volar plate was found to be approximately placed.  The wound was copiously irrigated with saline including the joint.  The volar plate was advanced distally where it was left up against the proximal aspect of the middle phalanx.  The skin was then closed interrupted 4-0 nylon sutures.  A dorsal longitudinal incision was made over the PIP joint carried down through subcutaneous tissue.  A longitudinal incision was made in the central slip where it was able to visualize the fracture fragment.  A 5-0 Mersilene suture was then passed through the triangular ligament distally.  This was then carried around the fracture fragment proximally through the extensor tendon.  And then back up through the angular ligament where it was sewn pulling of the fracture fragment into reduction.  Image intensification X rays confirmed that the PIP joint was reduced.  This was done AP lateral directions and a 3 5 K wire was then passed across the joint meaning it maintaining it in a position of  approximately 5 to 10 degrees of flexion.  The dorsal wound was closed after irrigation with interrupted 4-0 nylon sutures.  The K wire was bent cut short.  A sterile compressive dorsal palmar dressing and splint to the finger was applied.  Deflation of the tourniquet fingers pink.  She was taken to the recovery room for observation in satisfactory condition.  She will be discharged home to [facility] week on Tylenol ibuprofen for pain with Norco for breakthrough.   Linda SaltGary Kimba Lottes, MD Electronically signed, 10/30/18

## 2018-10-30 NOTE — Anesthesia Preprocedure Evaluation (Addendum)
Anesthesia Evaluation  Patient identified by MRN, date of birth, ID band Patient awake    Reviewed: Allergy & Precautions, NPO status , Patient's Chart, lab work & pertinent test results, reviewed documented beta blocker date and time   Airway Mallampati: II  TM Distance: >3 FB Neck ROM: Full    Dental no notable dental hx. (+) Teeth Intact   Pulmonary neg pulmonary ROS,    Pulmonary exam normal breath sounds clear to auscultation       Cardiovascular hypertension, Pt. on medications and Pt. on home beta blockers Normal cardiovascular exam Rhythm:Regular Rate:Normal     Neuro/Psych  Headaches, PSYCHIATRIC DISORDERS Anxiety Depression Restless legs syndrome Glaucoma   Neuromuscular disease    GI/Hepatic Neg liver ROS, GERD  Medicated and Controlled,IBS   Endo/Other  diabetes, Well Controlled, Type 2, Oral Hypoglycemic AgentsHypothyroidism Obesity Hypercholesterolemia  Renal/GU Renal InsufficiencyRenal disease Bladder dysfunction  OA bladder SUI S/P interstim implant S3    Musculoskeletal  (+) Arthritis , Osteoarthritis,  Fibromyalgia -Fibromyositis   Abdominal (+) + obese,   Peds  Hematology  (+) anemia ,   Anesthesia Other Findings   Reproductive/Obstetrics                           Anesthesia Physical Anesthesia Plan  ASA: III  Anesthesia Plan: MAC and Regional   Post-op Pain Management:    Induction:   PONV Risk Score and Plan: 2 and Ondansetron, Propofol infusion and Treatment may vary due to age or medical condition  Airway Management Planned: Natural Airway, Nasal Cannula and Simple Face Mask  Additional Equipment:   Intra-op Plan:   Post-operative Plan:   Informed Consent: I have reviewed the patients History and Physical, chart, labs and discussed the procedure including the risks, benefits and alternatives for the proposed anesthesia with the patient or authorized  representative who has indicated his/her understanding and acceptance.     Dental advisory given  Plan Discussed with: CRNA and Surgeon  Anesthesia Plan Comments:         Anesthesia Quick Evaluation

## 2018-10-30 NOTE — Op Note (Signed)
Radiology note: Image intensification x-rays were taken of the finger and AP lateral direction before and after placement of K wire to maintain reduction in appropriate position to the PIP joint.

## 2018-10-30 NOTE — Anesthesia Procedure Notes (Signed)
Anesthesia Regional Block: Supraclavicular block   Pre-Anesthetic Checklist: ,, timeout performed, Correct Patient, Correct Site, Correct Laterality, Correct Procedure, Correct Position, site marked, Risks and benefits discussed,  Surgical consent,  Pre-op evaluation,  At surgeon's request and post-op pain management  Laterality: Left  Prep: chloraprep       Needles:  Injection technique: Single-shot  Needle Type: Echogenic Stimulator Needle     Needle Length: 9cm  Needle Gauge: 21   Needle insertion depth: 7 cm   Additional Needles:   Procedures:,,,, ultrasound used (permanent image in chart),,,,  Narrative:  Start time: 10/30/2018 1:23 PM End time: 10/30/2018 1:27 PM Injection made incrementally with aspirations every 5 mL.  Performed by: Personally  Anesthesiologist: Josephine Igo, MD  Additional Notes: Timeout performed. Patient sedated. Relevant anatomy ID'd using [place]. Incremental 2-51ml injection of LA with frequent aspiration. Patient tolerated procedure well.        Left Supraclavicular block

## 2018-10-30 NOTE — H&P (Signed)
Linda Lucas is an 73 y.o. female.   Chief Complaint: dislocation finger HPI: Linda Lucas  sustained a dislocation of her PIP joint left middle finger when she stumbled at church falling on her. She was seen at [facility] emergency room with a laceration on the palmar aspect dislocation of the finger which was treated and reduced the wounds were sutured. She was given IV antibiotic and placed on antibiotics p.o. Is complaining of moderate discomfort. Placed in a splint and referred. He has a history of diabetes thyroid problems arthritis no history of gout. Family history is positive diabetes negative for the remainder.   Past Medical History:  Diagnosis Date  . Ankle fracture    bimeolar fracture  . Anxiety   . Chronic urethritis    frequent uti's and or urethritis  . Degenerative disc disease, lumbar 2007   bulging disc  . Depression   . Diabetes mellitus 2004  . Fibromyalgia   . Glaucoma   . Hypercholesteremia   . IBS (irritable bowel syndrome)   . 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 Migraine   . Osteoarthritis   . Overactive bladder   . Prolactin increased ([place]) 05/2002   NORMAL MRI    Past Surgical History:  Procedure Laterality Date  . ABDOMINAL HYSTERECTOMY    . APPENDECTOMY  age 70  . BREAST BIOPSY     x 2  . BREAST EXCISIONAL BIOPSY Left 1994  . BREAST EXCISIONAL BIOPSY Left 1994  . BURCH PROCEDURE    . CARPECTOMY HAND     LEFT FOR KEINBACH'S DZ  . CATARACT EXTRACTION    . CHOLECYSTECTOMY OPEN     WITH CDE  . CYSTOSCOPY     X 4  . FRACTURE METARSAL    . FUSION OF DISCS     C 3-4 4-5 5-6 VERTBRAES IN NECK DUE TO DEGEN. [place] DZ  . FUSION OF LEFT WRIST WITH PLATE AND SCREWS  2542   CADAVER BONE GRAFT AND APPLICATION OF HUMAN GROWTH FACTORS  . HEMORROIDECTOMY  age 62  . HYSTEROSCOPY     D&C,HYSTEROSCOPY FOR PED FIBROID   . INTERSTIM IMPLANT PLACEMENT    . ORIF RADIAL HEAD / NECK FRACTURE  2010   RIGHT RADIAL HED EXTENDING INTO JOINT SPACE WITH PLATE AND SCREWS  . PERCUTANEOUS PINNING Left 01/31/2017   Procedure: PERCUTANEOUS PINNING EXTREMITYLEFT SMALL FINGER;  Surgeon: Daryll Brod, MD;  Location: [facility];  Service: Orthopedics;  Laterality: Left;  . RIGHT ANKLE FRACTURE    . ROTATOR CUFF REPAIR    . TONSILLECTOMY AND ADENOIDECTOMY    . TUBAL LIGATION      Family History  Problem Relation Age of Onset  . Other Daughter        needle stick at hospital died of AIDS and hepatiitis  . Diabetes Mother   . Hypertension Mother   . Heart disease Mother   . Heart attack Mother   . Diabetes Father   . Heart disease Father   . Hypertension Father   . Rheumatic fever Father   . Diabetes Sister   . Osteoarthritis Sister   . Heart attack Sister   . Diabetes Brother   . Heart attack Brother   . Cancer Maternal Grandmother        OVARIAN OR [place]  .  Other Son        paralyzed after motorcycle accident   Social History:  reports that she has never smoked. She has never used smokeless tobacco. She reports that she does not drink alcohol or use drugs.  Allergies:  Allergies  Allergen Reactions  . Empagliflozin Other (See Comments)    Pt stated, "This gave me a yeast infection"  . Adhesive [Tape] Other (See Comments)    Causes skin to peel and blister- ok with paper tape   . Oxycodone-Acetaminophen Nausea And Vomiting  . Oxymorphone Hcl Nausea And Vomiting  . Aspirin Swelling and Rash    "IN LARGE DOSES"- Body swells  . Ivp Dye [Iodinated Diagnostic Agents] Rash    No medications prior to admission.    Results for orders placed or performed during the hospital encounter of 10/28/18 (from the past 48 hour(s))  CBC with Differential     Status: Abnormal   Collection Time: 10/28/18  3:16 PM  Result Value Ref Range   WBC 12.9 (H) 4.0 - 10.5 K/uL    RBC 3.59 (L) 3.87 - 5.11 MIL/uL   Hemoglobin 11.3 (L) 12.0 - 15.0 g/dL   HCT 40.934.6 (L) 81.136.0 - 91.446.0 %   MCV 96.4 80.0 - 100.0 fL   MCH 31.5 26.0 - 34.0 pg   MCHC 32.7 30.0 - 36.0 g/dL   RDW 78.212.2 95.611.5 - 21.315.5 %   Platelets 273 150 - 400 K/uL   nRBC 0.0 0.0 - 0.2 %   Neutrophils Relative % 56 %   Neutro Abs 7.2 1.7 - 7.7 K/uL   Lymphocytes Relative 31 %   Lymphs Abs 4.0 0.7 - 4.0 K/uL   Monocytes Relative 8 %   Monocytes Absolute 1.1 (H) 0.1 - 1.0 K/uL   Eosinophils Relative 3 %   Eosinophils Absolute 0.4 0.0 - 0.5 K/uL   Basophils Relative 1 %   Basophils Absolute 0.1 0.0 - 0.1 K/uL   Immature Granulocytes 1 %   Abs Immature Granulocytes 0.10 (H) 0.00 - 0.07 K/uL    Comment: Performed at [facility], [address] [place], [place] [address]  Basic metabolic panel     Status: Abnormal   Collection Time: 10/28/18  3:16 PM  Result Value Ref Range   Sodium 138 135 - 145 mmol/L   Potassium 4.0 3.5 - 5.1 mmol/L   Chloride 104 98 - 111 mmol/L   CO2 23 22 - 32 mmol/L   Glucose, Bld 131 (H) 70 - 99 mg/dL   BUN 26 (H) 8 - 23 mg/dL   Creatinine, Ser 8.461.60 (H) 0.44 - 1.00 mg/dL   Calcium 9.3 8.9 - 96.210.3 mg/dL   GFR calc non Af Amer 31 (L) >60 mL/min   GFR calc Af Amer 36 (L) >60 mL/min   Anion gap 11 5 - 15    Comment: Performed at [facility], [address] [place], [place] [address]    Dg Forearm Left  Result Date: 10/28/2018 CLINICAL DATA:  Pain EXAM: LEFT FOREARM - 2 VIEW COMPARISON:  None. FINDINGS: Patient is status post plate and screw fusion across the wrist the of the third metacarpal. There are advanced degenerative changes of the wrist. The hardware appears grossly intact. There is no evidence for an acute displaced fracture or dislocation. IMPRESSION: No acute displaced fracture or dislocation. Electronically Signed   By: Katherine Mantlehristopher  Green M.D.   On: 10/28/2018 15:14   Dg Finger Middle Left  Result Date:  10/28/2018 CLINICAL DATA:  Post reduction EXAM: LEFT  MIDDLE FINGER 2+V COMPARISON:  X-ray from same day FINDINGS: There is been interval reduction of the previously noted dislocation of the proximal interphalangeal joint. There is persistent surrounding soft tissue swelling. Again noted is a is a mildly displaced fracture involving the dorsal base of the middle phalanx. There is a slight irregularity of the head of the proximal phalanx which is likely projectional. IMPRESSION: 1. Interval reduction of the previously demonstrated dislocation. 2. Persistent fracture fragment involving the dorsal base of the middle phalanx of the third digit. Electronically Signed   By: Katherine Mantlehristopher  Green M.D.   On: 10/28/2018 17:14   Dg Finger Middle Left  Result Date: 10/28/2018 CLINICAL DATA:  Possible open fracture EXAM: LEFT MIDDLE FINGER 2+V COMPARISON:  None. FINDINGS: There is an acute appearing dislocation of the proximal interphalangeal joint. There is a small pocket of gas adjacent to the base of the middle phalanx. There is a mildly displaced intra-articular fracture involving the dorsal base of the middle phalanx. There is surrounding soft tissue swelling. The patient appears to be status post prior plate screw fixation of the third metacarpal. IMPRESSION: 1. Acute dislocation of the proximal interphalangeal joint of the third digit. 2. Acute mildly displaced intra-articular fracture involving the dorsal base of the middle phalanx. 3. Small pockets of gas adjacent to the base of the middle phalanx. This may be secondary to a laceration at this level. Electronically Signed   By: Katherine Mantlehristopher  Green M.D.   On: 10/28/2018 15:13     Pertinent items are noted in HPI.  Height 5\' 2"  (1.575 m), weight 82 kg.  General appearance: alert, cooperative and appears stated age Head: Normocephalic, without obvious abnormality Neck: no JVD Resp: clear to auscultation bilaterally Cardio: regular rate and rhythm, S1, S2 normal, no murmur, click, rub or gallop GI: soft,  non-tender; bowel sounds normal; no masses,  no organomegaly Extremities: Dislocation PIP joint left middle finger Pulses: 2+ and symmetric Skin: Skin color, texture, turgor normal. No rashes or lesions Neurologic: Grossly normal Incision/Wound: sutured  Assessment/Plan Assessment:  1. Dislocation of interphalangeal joint of left middle finger   Plan: Discussed the nature range of the injury with her. Would recommend debridement of this with expiration the dorsal aspect of the PIP joint are possible central slip rupture. She schedule as an outpatient under regional anesthesia. We are also recommend pinning the joint. Postoperative course are discussed along with risks and complications.    Cindee SaltGary Mekiah Cambridge 10/30/2018, 9:33 AM

## 2018-10-30 NOTE — Op Note (Signed)
I assisted Surgeon(s) and Role:    * Daryll Brod, MD - Primary    * Leanora Cover, MD - Assisting on the Procedure(s): EXPLORATION PROXIMAL INTERPHALANGEAL JOINT LEFT MIDDLE FINGER EXTENSOR TENDON REPAIR CLOSED REDUCTION WITH PERCUTANEOUS PINNING LEFT MIDDLE FINGER on 10/30/2018.  I provided assistance on this case as follows: retraction soft tissues, placement pin.  Electronically signed by: Leanora Cover, MD Date: 10/30/2018 Time: 3:05 PM

## 2018-10-30 NOTE — Brief Op Note (Signed)
10/30/2018  3:05 PM  PATIENT:  Linda Lucas  73 y.o. female  PRE-OPERATIVE DIAGNOSIS:  LEFT MIDDLE FINGER PROXIMAL INTERPHALANGEAL DISLOCATION  POST-OPERATIVE DIAGNOSIS:  LEFT MIDDLE FINGER PROXIMAL INTERPHALANGEAL DISLOCATION  PROCEDURE:  Procedure(s): EXPLORATION PROXIMAL INTERPHALANGEAL JOINT LEFT MIDDLE FINGER (Left) EXTENSOR TENDON REPAIR (Left) CLOSED REDUCTION WITH PERCUTANEOUS PINNING LEFT MIDDLE FINGER (Left)  SURGEON:  Surgeon(s) and Role:    * Daryll Brod, MD - Primary    * Leanora Cover, MD - Assisting  PHYSICIAN ASSISTANT:   ASSISTANTS: K Kelena Garrow,MD   ANESTHESIA:   local, regional and IV sedation  EBL: 52ml  BLOOD ADMINISTERED:none  DRAINS: none   LOCAL MEDICATIONS USED:  BUPIVICAINE   SPECIMEN:  No Specimen  DISPOSITION OF SPECIMEN:  N/A  COUNTS:  YES  TOURNIQUET:   Total Tourniquet Time Documented: Upper Arm (Left) - 30 minutes Total: Upper Arm (Left) - 30 minutes   DICTATION: .Viviann Spare Dictation  PLAN OF CARE: Discharge to home after PACU  PATIENT DISPOSITION:  PACU - hemodynamically stable.

## 2018-10-30 NOTE — Discharge Instructions (Addendum)

## 2018-10-30 NOTE — Transfer of Care (Signed)
Immediate Anesthesia Transfer of Care Note  Patient: Linda Lucas  Procedure(s) Performed: EXPLORATION PROXIMAL INTERPHALANGEAL JOINT LEFT MIDDLE FINGER (Left Finger) EXTENSOR TENDON REPAIR (Left Finger) CLOSED REDUCTION WITH PERCUTANEOUS PINNING LEFT MIDDLE FINGER (Left Finger)  Patient Location: PACU  Anesthesia Type:MAC and Regional  Level of Consciousness: awake, alert  and oriented  Airway & Oxygen Therapy: Patient Spontanous Breathing and Patient connected to face mask oxygen  Post-op Assessment: Report given to RN and Post -op Vital signs reviewed and stable  Post vital signs: Reviewed and stable  Last Vitals:  Vitals Value Taken Time  BP    Temp    Pulse 83 10/30/18 1506  Resp 27 10/30/18 1506  SpO2 94 % 10/30/18 1506  Vitals shown include unvalidated device data.  Last Pain:  Vitals:   10/30/18 1328  TempSrc:   PainSc: 6       Patients Stated Pain Goal: 4 (79/39/03 0092)  Complications: No apparent anesthesia complications

## 2018-10-30 NOTE — Anesthesia Postprocedure Evaluation (Signed)
Anesthesia Post Note  Patient: Linda Lucas  Procedure(s) Performed: EXPLORATION PROXIMAL INTERPHALANGEAL JOINT LEFT MIDDLE FINGER (Left Finger) EXTENSOR TENDON REPAIR (Left Finger) OPEN REDUCTION WITH FIXATION LEFT MIDDLE FINGER (Left Finger)     Patient location during evaluation: PACU Anesthesia Type: Regional and MAC Level of consciousness: awake and alert and oriented Pain management: pain level controlled Vital Signs Assessment: post-procedure vital signs reviewed and stable Respiratory status: spontaneous breathing, nonlabored ventilation and respiratory function stable Cardiovascular status: stable and blood pressure returned to baseline Postop Assessment: no apparent nausea or vomiting Anesthetic complications: no    Last Vitals:  Vitals:   10/30/18 1530 10/30/18 1545  BP: (!) 110/55 (!) 115/52  Pulse: 73 74  Resp: 16 18  Temp:  36.9 C  SpO2: 98% 97%    Last Pain:  Vitals:   10/30/18 1545  TempSrc:   PainSc: 0-No pain                 Thaddius Manes A.

## 2018-10-30 NOTE — Progress Notes (Signed)
Assisted Dr. Foster with left, ultrasound guided, supraclavicular block. Side rails up, monitors on throughout procedure. See vital signs in flow sheet. Tolerated Procedure well. ?

## 2018-10-31 ENCOUNTER — Encounter (HOSPITAL_BASED_OUTPATIENT_CLINIC_OR_DEPARTMENT_OTHER): Payer: Self-pay | Admitting: Orthopedic Surgery

## 2018-11-04 LAB — AEROBIC/ANAEROBIC CULTURE W GRAM STAIN (SURGICAL/DEEP WOUND): Culture: NO GROWTH

## 2018-12-07 ENCOUNTER — Telehealth: Payer: Self-pay | Admitting: Physical Medicine and Rehabilitation

## 2018-12-10 NOTE — Telephone Encounter (Signed)
Notification or Prior Authorization is not required for the requested services  This [employer]vant[employer] members plan does not currently require a prior authorization for [id number]  Decision ID #:[id number]

## 2018-12-10 NOTE — Telephone Encounter (Signed)
Is auth needed for [phone]? Scheduled for 10/19 with driver and no blood thinners.

## 2018-12-10 NOTE — Telephone Encounter (Signed)
Left L4-5 interlam

## 2018-12-10 NOTE — Telephone Encounter (Signed)
Left message #1

## 2018-12-20 ENCOUNTER — Other Ambulatory Visit: Payer: Self-pay

## 2018-12-20 ENCOUNTER — Encounter: Payer: Self-pay | Admitting: Gynecology

## 2018-12-20 ENCOUNTER — Encounter: Payer: Self-pay | Admitting: Physical Medicine and Rehabilitation

## 2018-12-20 ENCOUNTER — Ambulatory Visit: Payer: Self-pay

## 2018-12-20 ENCOUNTER — Ambulatory Visit (INDEPENDENT_AMBULATORY_CARE_PROVIDER_SITE_OTHER): Payer: Medicare Other | Admitting: Physical Medicine and Rehabilitation

## 2018-12-20 VITALS — BP 147/60 | HR 86

## 2018-12-20 DIAGNOSIS — M5416 Radiculopathy, lumbar region: Secondary | ICD-10-CM

## 2018-12-20 MED ORDER — METHYLPREDNISOLONE ACETATE 80 MG/ML IJ SUSP
80.0000 mg | Freq: Once | INTRAMUSCULAR | Status: AC
  Administered 2018-12-20: 80 mg

## 2018-12-20 NOTE — Progress Notes (Signed)
 .  Numeric Pain Rating Scale and Functional Assessment Average Pain 8   In the last MONTH (on 0-10 scale) has pain interfered with the following?  1. General activity like being  able to carry out your everyday physical activities such as walking, climbing stairs, carrying groceries, or moving a chair?  Rating(5)   +Driver, -BT, -Dye Allergies.  

## 2018-12-27 ENCOUNTER — Telehealth: Payer: Self-pay | Admitting: Physical Medicine and Rehabilitation

## 2018-12-27 NOTE — Telephone Encounter (Signed)
Right L4 and L5 Tf esi

## 2018-12-27 NOTE — Telephone Encounter (Signed)
Is auth needed for [phone] and [phone]? Scheduled for 11/4 with driver.

## 2018-12-27 NOTE — Telephone Encounter (Signed)
64483 Injection(s), anesthetic agent and/or st more  Notification/Prior Authorization not required if procedure performed in Office; otherwise may be required for this service.  [address]484 Injection(s), anesthetic agent and/or st more  Notification/Prior Authorization not required for this service.

## 2018-12-31 ENCOUNTER — Encounter: Payer: Medicare Other | Admitting: Physical Medicine and Rehabilitation

## 2019-01-09 ENCOUNTER — Other Ambulatory Visit: Payer: Self-pay | Admitting: Geriatric Medicine

## 2019-01-09 DIAGNOSIS — G44329 Chronic post-traumatic headache, not intractable: Secondary | ICD-10-CM

## 2019-01-11 ENCOUNTER — Other Ambulatory Visit: Payer: Medicare Other

## 2019-01-16 ENCOUNTER — Encounter: Payer: Medicare Other | Admitting: Physical Medicine and Rehabilitation

## 2019-01-21 ENCOUNTER — Other Ambulatory Visit: Payer: Medicare Other

## 2019-01-22 ENCOUNTER — Ambulatory Visit: Payer: Medicare Other | Admitting: Physical Medicine and Rehabilitation

## 2019-01-22 ENCOUNTER — Other Ambulatory Visit: Payer: Self-pay

## 2019-01-22 ENCOUNTER — Ambulatory Visit: Payer: Self-pay

## 2019-01-22 ENCOUNTER — Encounter: Payer: Self-pay | Admitting: Physical Medicine and Rehabilitation

## 2019-01-22 VITALS — BP 152/80 | HR 85

## 2019-01-22 DIAGNOSIS — M47816 Spondylosis without myelopathy or radiculopathy, lumbar region: Secondary | ICD-10-CM | POA: Diagnosis not present

## 2019-01-22 DIAGNOSIS — G8929 Other chronic pain: Secondary | ICD-10-CM

## 2019-01-22 MED ORDER — METHYLPREDNISOLONE ACETATE 80 MG/ML IJ SUSP
80.0000 mg | Freq: Once | INTRAMUSCULAR | Status: AC
  Administered 2019-01-22: 80 mg

## 2019-01-22 NOTE — Progress Notes (Signed)
Linda Lucas - 73 y.o. female MRN [id number]  Date of birth: [birth year]  Office Visit Note: Visit Date: 12/20/2018 PCP: Lajean Manes, MD Referred by: Lajean Manes, MD  Subjective: Chief Complaint  Patient presents with  . Lower Back - Pain  . Right Leg - Pain  . Left Thigh - Pain   HPI:  Linda Lucas is a 73 y.o. female who comes in today For reevaluation and management of low back pain with radicular leg pain on the right side and some pain in the left thigh.  She reports several weeks now worsening symptoms.  Prior history of lumbar issues and history of multiple orthopedic surgeries as well as anterior cervical discectomy and fusion.  She has had CT scan of the lumbar spine performed last year this is reviewed with the patient again today she has new onset moderate stenosis at L4-5 with foraminal narrowing L5-S1.  Her case is very complicated by history of depression and fibromyalgia.  She has had some pain with walking gets better with rest and sitting.  Average pain is 8 out of 10.  No red flag complaints.  A lot of pain over the musculoskeletal system including the neck and upper back as well.  Multiple musculoskeletal complaints.  No focal weakness.  Has had multiple bouts of physical therapy.  She has multiple drug intolerances including pain medications.  She does have x-ray contrast allergy.  Review of Systems  Constitutional: Negative for chills, fever, malaise/fatigue and weight loss.  HENT: Negative for hearing loss and sinus pain.   Eyes: Negative for blurred vision, double vision and photophobia.  Respiratory: Negative for cough and shortness of breath.   Cardiovascular: Negative for chest pain, palpitations and leg swelling.  Gastrointestinal: Negative for abdominal pain, nausea and vomiting.  Genitourinary: Negative for flank pain.  Musculoskeletal: Positive for back pain and joint pain. Negative for myalgias.  Skin: Negative for itching and rash.  Neurological:  Negative for tremors, focal weakness and weakness.  Endo/Heme/Allergies: Negative.   Psychiatric/Behavioral: Negative for depression.  All other systems reviewed and are negative.  Otherwise per HPI.  Assessment & Plan: Visit Diagnoses:  1. Lumbar radiculopathy     Plan: Findings:  Chronic worsening low back pain with radicular type more pain on the right leg.  Some pain in the left thigh pain across the lower back which I feel like is exacerbated by her fibromyalgia.  Lumbar spine CT scan shows new onset of more moderate stenosis at L4-5 with foraminal narrowing L5-S1.  No high-grade stenosis or nerve compression.  Ability to have MRI did InterStim device for bladder problems and interstitial cystitis.  We will complete L4-5 interlaminar injection today.  Depending on relief would look at transforaminal approach.  Very difficult patient in the past obtaining good pain control.    Meds & Orders:  Meds ordered this encounter  Medications  . methylPREDNISolone acetate (DEPO-MEDROL) injection 80 mg    Orders Placed This Encounter  Procedures  . XR C-ARM NO REPORT  . Epidural Steroid injection    Follow-up: Return if symptoms worsen or fail to improve.   Procedures: No procedures performed  Lumbar Epidural Steroid Injection - Interlaminar Approach with Fluoroscopic Guidance  Patient: Linda Lucas      Date of Birth: [birth year] MRN: [id number] PCP: Lajean Manes, MD      Visit Date: 12/20/2018   Universal Protocol:     Consent Given By: the patient  Position: PRONE  Additional Comments:  Vital signs were monitored before and after the procedure. Patient was prepped and draped in the usual sterile fashion. The correct patient, procedure, and site was verified.   Injection Procedure Details:  Procedure Site One Meds Administered:  Meds ordered this encounter  Medications  . methylPREDNISolone acetate (DEPO-MEDROL) injection 80 mg     Laterality: Right  Location/Site:   L4-L5  Needle size: 20 G  Needle type: Tuohy  Needle Placement: Paramedian epidural  Findings:   -Comments: Excellent flow of contrast into the epidural space.  Procedure Details: Using a paramedian approach from the side mentioned above, the region overlying the inferior lamina was localized under fluoroscopic visualization and the soft tissues overlying this structure were infiltrated with 4 ml. of 1% Lidocaine without Epinephrine. The Tuohy needle was inserted into the epidural space using a paramedian approach.   The epidural space was localized using loss of resistance along with lateral and bi-planar fluoroscopic views.  After negative aspirate for air, blood, and CSF, a 2 ml. volume of Isovue-250 was injected into the epidural space and the flow of contrast was observed. Radiographs were obtained for documentation purposes.    The injectate was administered into the level noted above.   Additional Comments:  The patient tolerated the procedure well Dressing: 2 x 2 sterile gauze and Band-Aid    Post-procedure details: Patient was observed during the procedure. Post-procedure instructions were reviewed.  Patient left the clinic in stable condition.   Clinical History: CT LUMBAR SPINE WITHOUT CONTRAST  TECHNIQUE: Multidetector CT imaging of the lumbar spine was performed without intravenous contrast administration. Multiplanar CT image reconstructions were also generated.  COMPARISON: Lumbar spine MRI 12/22/2007  FINDINGS: Segmentation: 5 lumbar type vertebrae.  Alignment: Chronic trace retrolisthesis of L1 on L2, L2 on L3, and L3 on L4. Trace anterolisthesis of L4 on L5. Slight left convex lumbar curvature.  Vertebrae: No acute fracture or destructive osseous process. Progressive, severe disc space narrowing at L5-S1 with prominent degenerative vertebral sclerosis. Asymmetric right-sided endplate sclerosis at L4-5. Vacuum disc from L3-4 to L5-S1.   Paraspinal and other soft tissues: Status post cholecystectomy. Nonobstructing 6 mm right lower pole renal calculus. Minimal abdominal aortic atherosclerosis.  Disc levels:  T12-L1: Mild disc bulging without stenosis.  L1-2: Chronic mild disc bulging asymmetric to the left without stenosis.  L2-3: Circumferential disc bulging, stable to slightly increased. Mild facet and ligamentum flavum hypertrophy. No stenosis.  L3-4: Increased circumferential disc bulging and mild-to-moderate facet and ligamentum flavum hypertrophy result in borderline to mild spinal stenosis without neural foraminal stenosis.  L4-5: Progressive disc and facet degeneration. Circumferential disc bulging, ligamentum flavum hypertrophy, and severe facet arthrosis result in new moderate spinal stenosis, left greater than right lateral recess stenosis, and mild-to-moderate right and mild left neural foraminal stenosis.  L5-S1: Similar broad central disc protrusion without significant spinal stenosis. Disc bulging, endplate spurring, disc space height loss, and moderate facet hypertrophy result in progressive, moderate to severe bilateral neural foraminal stenosis. Potential bilateral L5 nerve root impingement.  IMPRESSION: 1. Advanced lumbar disc and facet degeneration, progressed from 2009. 2. New moderate spinal stenosis and mild-to-moderate neural foraminal stenosis at L4-5. 3. Progressive moderate to severe bilateral neural foraminal stenosis at L5-S1. 4. Borderline to mild spinal stenosis at L3-4. 5. Nonobstructing right nephrolithiasis. 6. Aortic Atherosclerosis (ICD10-I70.0).   Electronically Signed By: Sebastian Ache M.D. On: 07/07/2017 14:21    06/03/2017 IMAGING:  X-RAYS: AP and lateral view of the lumbar spine. These were taken and  reviewed  in the office today.  INTERPRETATION: No acute findings. Diffuse degenerative arthritis  throughout.Interstitial catheter in place.  AP  view of the pelvis.Taken and reviewed in the office today.  INTERPRETATION: No acute findings.Hip joint spaces are maintained and  congruent.     Objective:  VS:  HT:    WT:   BMI:     BP:(!) 147/60  HR:86bpm  TEMP: ( )  RESP:  Physical Exam Vitals signs and nursing note reviewed.  Constitutional:      General: She is not in acute distress.    Appearance: Normal appearance. She is well-developed. She is not ill-appearing.  HENT:     Head: Normocephalic and atraumatic.  Eyes:     Conjunctiva/sclera: Conjunctivae normal.     Pupils: Pupils are equal, round, and reactive to light.  Cardiovascular:     Rate and Rhythm: Normal rate.     Pulses: Normal pulses.  Pulmonary:     Effort: Pulmonary effort is normal.  Musculoskeletal:     Right lower leg: No edema.     Left lower leg: No edema.     Comments: Patient has tenderness across the lower back in all quadrants and over the PSIS and greater trochanters.  No pain with hip rotation good distal strength no clonus.  Skin:    General: Skin is warm and dry.     Findings: No erythema or rash.  Neurological:     General: No focal deficit present.     Mental Status: She is alert and oriented to person, place, and time.     Sensory: No sensory deficit.     Motor: No abnormal muscle tone.     Coordination: Coordination normal.     Gait: Gait normal.  Psychiatric:        Mood and Affect: Mood normal.        Behavior: Behavior normal.     Ortho Exam Imaging: No results found.

## 2019-01-22 NOTE — Progress Notes (Signed)
Linda Lucas - 73 y.o. female MRN [id number]  Date of birth: January 09, 1946  Office Visit Note: Visit Date: 01/22/2019 PCP: Lajean Manes, MD Referred by: Lajean Manes, MD  Subjective: Chief Complaint  Patient presents with  . Lower Back - Pain   HPI: Linda Lucas is a 73 y.o. female who comes in today For planned bilateral L4-5 facet joint injection.  Patient had prior epidural injection completed in October that helped her leg pain greatly but she still has a lot of axial back pain.  MRI shows facet arthropathy particular at L4-5 with listhesis.  Moderate stenosis.  She is having no leg pain at this point with 7 out of 10 axial back pain.  The patient has failed conservative care including home exercise, medications, time and activity modification.  This injection will be diagnostic and hopefully therapeutic.  Please see requesting physician notes for further details and justification.   ROS Otherwise per HPI.  Assessment & Plan: Visit Diagnoses:  1. Spondylosis without myelopathy or radiculopathy, lumbar region   2. Chronic bilateral low back pain without sciatica     Plan: No additional findings.   Meds & Orders:  Meds ordered this encounter  Medications  . methylPREDNISolone acetate (DEPO-MEDROL) injection 80 mg    Orders Placed This Encounter  Procedures  . Facet Injection  . XR C-ARM NO REPORT    Follow-up: Return if symptoms worsen or fail to improve.   Procedures: No procedures performed  Lumbar Facet Joint Intra-Articular Injection(s) with Fluoroscopic Guidance  Patient: Linda Lucas      Date of Birth: 12/29/1945 MRN: [id number] PCP: Lajean Manes, MD      Visit Date: 01/22/2019   Universal Protocol:    Date/Time: 01/22/2019  Consent Given By: the patient  Position: PRONE   Additional Comments: Vital signs were monitored before and after the procedure. Patient was prepped and draped in the usual sterile fashion. The correct patient, procedure, and  site was verified.   Injection Procedure Details:  Procedure Site One Meds Administered:  Meds ordered this encounter  Medications  . methylPREDNISolone acetate (DEPO-MEDROL) injection 80 mg     Laterality: Bilateral  Location/Site:  L4-L5  Needle size: 22 guage  Needle type: Spinal  Needle Placement: Articular  Findings:  -Comments: Excellent flow of contrast producing a partial arthrogram.  Procedure Details: The fluoroscope beam is vertically oriented in AP, and the inferior recess is visualized beneath the lower pole of the inferior apophyseal process, which represents the target point for needle insertion. When direct visualization is difficult the target point is located at the medial projection of the vertebral pedicle. The region overlying each aforementioned target is locally anesthetized with a 1 to 2 ml. volume of 1% Lidocaine without Epinephrine.   The spinal needle was inserted into each of the above mentioned facet joints using biplanar fluoroscopic guidance. A 0.25 to 0.5 ml. volume of Isovue-250 was injected and a partial facet joint arthrogram was obtained. A single spot film was obtained of the resulting arthrogram.    One to 1.25 ml of the steroid/anesthetic solution was then injected into each of the facet joints noted above.   Additional Comments:  The patient tolerated the procedure well Dressing: 2 x 2 sterile gauze and Band-Aid    Post-procedure details: Patient was observed during the procedure. Post-procedure instructions were reviewed.  Patient left the clinic in stable condition.     Clinical History: CT LUMBAR SPINE WITHOUT CONTRAST  TECHNIQUE: Multidetector  CT imaging of the lumbar spine was performed without intravenous contrast administration. Multiplanar CT image reconstructions were also generated.  COMPARISON: Lumbar spine MRI 12/22/2007  FINDINGS: Segmentation: 5 lumbar type vertebrae.  Alignment: Chronic trace  retrolisthesis of L1 on L2, L2 on L3, and L3 on L4. Trace anterolisthesis of L4 on L5. Slight left convex lumbar curvature.  Vertebrae: No acute fracture or destructive osseous process. Progressive, severe disc space narrowing at L5-S1 with prominent degenerative vertebral sclerosis. Asymmetric right-sided endplate sclerosis at L4-5. Vacuum disc from L3-4 to L5-S1.  Paraspinal and other soft tissues: Status post cholecystectomy. Nonobstructing 6 mm right lower pole renal calculus. Minimal abdominal aortic atherosclerosis.  Disc levels:  T12-L1: Mild disc bulging without stenosis.  L1-2: Chronic mild disc bulging asymmetric to the left without stenosis.  L2-3: Circumferential disc bulging, stable to slightly increased. Mild facet and ligamentum flavum hypertrophy. No stenosis.  L3-4: Increased circumferential disc bulging and mild-to-moderate facet and ligamentum flavum hypertrophy result in borderline to mild spinal stenosis without neural foraminal stenosis.  L4-5: Progressive disc and facet degeneration. Circumferential disc bulging, ligamentum flavum hypertrophy, and severe facet arthrosis result in new moderate spinal stenosis, left greater than right lateral recess stenosis, and mild-to-moderate right and mild left neural foraminal stenosis.  L5-S1: Similar broad central disc protrusion without significant spinal stenosis. Disc bulging, endplate spurring, disc space height loss, and moderate facet hypertrophy result in progressive, moderate to severe bilateral neural foraminal stenosis. Potential bilateral L5 nerve root impingement.  IMPRESSION: 1. Advanced lumbar disc and facet degeneration, progressed from 2009. 2. New moderate spinal stenosis and mild-to-moderate neural foraminal stenosis at L4-5. 3. Progressive moderate to severe bilateral neural foraminal stenosis at L5-S1. 4. Borderline to mild spinal stenosis at L3-4. 5. Nonobstructing right  nephrolithiasis. 6. Aortic Atherosclerosis (ICD10-I70.0).   Electronically Signed By: Sebastian AcheAllen Grady M.D. On: 07/07/2017 14:21    06/03/2017 IMAGING:  X-RAYS: AP and lateral view of the lumbar spine. These were taken and  reviewed in the office today.  INTERPRETATION: No acute findings. Diffuse degenerative arthritis  throughout.Interstitial catheter in place.  AP view of the pelvis.Taken and reviewed in the office today.  INTERPRETATION: No acute findings.Hip joint spaces are maintained and  congruent.   She reports that she has never smoked. She has never used smokeless tobacco. No results for input(s): HGBA1C, LABURIC in the last 8760 hours.  Objective:  VS:  HT:    WT:   BMI:     BP:(!) 152/80  HR:85bpm  TEMP: ( )  RESP:  Physical Exam  Ortho Exam Imaging: No results found.  Past Medical/Family/Surgical/Social History: Medications & Allergies reviewed per EMR, new medications updated. Patient Active Problem List   Diagnosis Date Noted  . Onychomycosis 03/21/2019  . Skin fissure 03/21/2019  . Acute infection of nasal sinus 03/15/2018  . Yeast cystitis 03/14/2018  . Decreased range of motion of finger of left hand 03/06/2017  . Anxiety 02/15/2017  . Dislocation, finger, interphalangeal joint 01/30/2017  . Pain in finger of left hand 01/27/2017  . Chronic kidney disease, stage III (moderate) 08/13/2015  . Primary open angle glaucoma of both eyes, severe stage 06/29/2015  . Bronchitis 04/03/2015  . Essential (primary) hypertension 04/03/2015  . Fibromyositis 04/03/2015  . Peptic esophagitis 04/03/2015  . Hearing loss of both ears 04/03/2015  . History of colonic polyps 04/03/2015  . Hypothyroidism 04/03/2015  . Incontinence of feces 04/03/2015  . Migraine without aura and without status migrainosus, not intractable 04/03/2015  . Pain in  joint 04/03/2015  . Recurrent major depressive episodes, in full remission (HCC) 04/03/2015  . Disorder of rotator  cuff 11/26/2014  . Other specified postprocedural states 11/26/2014  . Difficulty walking 06/19/2014  . Lipodystrophy 02/13/2014  . Abnormal gait 10/15/2013  . At low risk for fall 10/15/2013  . Dyspareunia 07/25/2011  . Dysuria 07/25/2011  . Urge incontinence 07/25/2011  . Urinary urgency 07/25/2011  . Overactive bladder   . Migraine   . Fibromyalgia   . Chronic fatigue syndrome   . Metatarsal fracture   . Diabetes mellitus   . Ankle fracture   . Osteoarthritis   . IBS (irritable bowel syndrome)   . Depression   . Chronic urethritis   . Degenerative disc disease, lumbar   . Hypercholesteremia   . Prolactin increased   . Major depression in partial remission (HCC) 01/04/2011  . Restless legs syndrome 01/04/2011   Past Medical History:  Diagnosis Date  . Ankle fracture    bimeolar fracture  . Anxiety   . Chronic urethritis    frequent uti's and or urethritis  . Degenerative disc disease, lumbar 2007   bulging disc  . Depression   . Diabetes mellitus 2004  . Fibromyalgia   . Glaucoma   . Hypercholesteremia   . IBS (irritable bowel syndrome)   . 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 Migraine   . Osteoarthritis   . Overactive bladder   . Prolactin increased 05/2002   NORMAL MRI   Family History  Problem Relation Age of Onset  . Other Daughter        needle stick at hospital died of AIDS and hepatiitis  . Diabetes Mother   . Hypertension Mother   . Heart disease Mother   . Heart attack Mother   . Diabetes Father   . Heart disease Father   . Hypertension Father   . Rheumatic fever Father   . Diabetes Sister   . Osteoarthritis Sister   . Heart attack Sister   . Diabetes Brother   . Heart attack Brother   . Cancer Maternal Grandmother        [employer]  . Other Son        paralyzed after  motorcycle accident   Past Surgical History:  Procedure Laterality Date  . ABDOMINAL HYSTERECTOMY    . APPENDECTOMY  age 18  . BREAST BIOPSY     x 2  . BREAST EXCISIONAL BIOPSY Left 1994  . BREAST EXCISIONAL BIOPSY Left 1994  . BURCH PROCEDURE    . CARPECTOMY HAND     LEFT FOR KEINBACH'S DZ  . CATARACT EXTRACTION    . CHOLECYSTECTOMY OPEN     WITH CDE  . CYSTOSCOPY     X 4  . FRACTURE METARSAL    . FUSION OF DISCS     C 3-4 4-5 5-6 VERTBRAES IN NECK DUE TO DEGEN. DISC DZ  . FUSION OF LEFT WRIST WITH PLATE AND SCREWS  2009   CADAVER BONE GRAFT AND APPLICATION OF HUMAN GROWTH FACTORS  . HEMORROIDECTOMY  age 49  . HYSTEROSCOPY     D&C,HYSTEROSCOPY FOR PED FIBROID   . INTERSTIM IMPLANT PLACEMENT    . OPEN REDUCTION INTERNAL FIXATION (ORIF) PROXIMAL PHALANX Left 10/30/2018   Procedure: OPEN REDUCTION WITH FIXATION LEFT MIDDLE FINGER;  Surgeon: Cindee Salt, MD;  Location: [facility];  Service: Orthopedics;  Laterality: Left;  . ORIF RADIAL HEAD / NECK FRACTURE  2010   RIGHT RADIAL HED EXTENDING INTO JOINT SPACE WITH PLATE AND SCREWS  . PERCUTANEOUS PINNING Left 01/31/2017   Procedure: PERCUTANEOUS PINNING EXTREMITYLEFT SMALL FINGER;  Surgeon: Cindee Salt, MD;  Location: [facility];  Service: Orthopedics;  Laterality: Left;  . REPAIR EXTENSOR TENDON Left 10/30/2018   Procedure: EXTENSOR TENDON REPAIR;  Surgeon: Cindee Salt, MD;  Location: [facility];  Service: Orthopedics;  Laterality: Left;  . RIGHT ANKLE FRACTURE    . ROTATOR CUFF REPAIR    . TONSILLECTOMY AND ADENOIDECTOMY    . TUBAL LIGATION    . WOUND EXPLORATION Left 10/30/2018   Procedure: EXPLORATION PROXIMAL INTERPHALANGEAL JOINT LEFT MIDDLE FINGER;  Surgeon: Cindee Salt, MD;  Location: [facility];  Service: Orthopedics;  Laterality: Left;   Social History   Occupational History  . Not on file  Tobacco Use  . Smoking status: Never Smoker  . Smokeless tobacco:  Never Used  Substance and Sexual Activity  . Alcohol use: No    Alcohol/week: 0.0 standard drinks  . Drug use: No  . Sexual activity: Never    Birth control/protection: Surgical, Post-menopausal    Comment: 1st intercourse 73 yo--Fewer than 5 partners

## 2019-01-22 NOTE — Progress Notes (Signed)
.  Numeric Pain Rating Scale and Functional Assessment Average Pain 7   In the last MONTH (on 0-10 scale) has pain interfered with the following?  1. General activity like being  able to carry out your everyday physical activities such as walking, climbing stairs, carrying groceries, or moving a chair?  Rating(8)   +Driver, -BT, -Dye Allergies.  

## 2019-01-22 NOTE — Procedures (Signed)
Lumbar Epidural Steroid Injection - Interlaminar Approach with Fluoroscopic Guidance  Patient: Linda Lucas      Date of Birth: 1945-09-19 MRN: [id number] PCP: Lajean Manes, MD      Visit Date: 12/20/2018   Universal Protocol:     Consent Given By: the patient  Position: PRONE  Additional Comments: Vital signs were monitored before and after the procedure. Patient was prepped and draped in the usual sterile fashion. The correct patient, procedure, and site was verified.   Injection Procedure Details:  Procedure Site One Meds Administered:  Meds ordered this encounter  Medications  . methylPREDNISolone acetate (DEPO-MEDROL) injection 80 mg     Laterality: Right  Location/Site:  L4-L5  Needle size: 20 G  Needle type: Tuohy  Needle Placement: Paramedian epidural  Findings:   -Comments: Excellent flow of contrast into the epidural space.  Procedure Details: Using a paramedian approach from the side mentioned above, the region overlying the inferior lamina was localized under fluoroscopic visualization and the soft tissues overlying this structure were infiltrated with 4 ml. of 1% Lidocaine without Epinephrine. The Tuohy needle was inserted into the epidural space using a paramedian approach.   The epidural space was localized using loss of resistance along with lateral and bi-planar fluoroscopic views.  After negative aspirate for air, blood, and CSF, a 2 ml. volume of Isovue-250 was injected into the epidural space and the flow of contrast was observed. Radiographs were obtained for documentation purposes.    The injectate was administered into the level noted above.   Additional Comments:  The patient tolerated the procedure well Dressing: 2 x 2 sterile gauze and Band-Aid    Post-procedure details: Patient was observed during the procedure. Post-procedure instructions were reviewed.  Patient left the clinic in stable condition.

## 2019-01-23 ENCOUNTER — Ambulatory Visit
Admission: RE | Admit: 2019-01-23 | Discharge: 2019-01-23 | Disposition: A | Discharge status: Home / Self Care | Payer: Medicare Other | Source: Ambulatory Visit | Attending: Geriatric Medicine | Admitting: Geriatric Medicine

## 2019-01-23 DIAGNOSIS — G44329 Chronic post-traumatic headache, not intractable: Secondary | ICD-10-CM

## 2019-01-28 ENCOUNTER — Ambulatory Visit: Payer: Medicare Other | Admitting: Podiatry

## 2019-02-28 ENCOUNTER — Ambulatory Visit
Admission: RE | Admit: 2019-02-28 | Discharge: 2019-02-28 | Disposition: A | Discharge status: Home / Self Care | Payer: Medicare Other | Source: Ambulatory Visit | Attending: Geriatric Medicine | Admitting: Geriatric Medicine

## 2019-02-28 ENCOUNTER — Other Ambulatory Visit: Payer: Self-pay | Admitting: Geriatric Medicine

## 2019-02-28 DIAGNOSIS — M542 Cervicalgia: Secondary | ICD-10-CM

## 2019-03-01 ENCOUNTER — Other Ambulatory Visit: Payer: Self-pay | Admitting: Geriatric Medicine

## 2019-03-01 DIAGNOSIS — M542 Cervicalgia: Secondary | ICD-10-CM

## 2019-03-05 ENCOUNTER — Ambulatory Visit: Payer: Medicare Other | Admitting: Podiatry

## 2019-03-05 ENCOUNTER — Other Ambulatory Visit: Payer: Self-pay

## 2019-03-05 DIAGNOSIS — E119 Type 2 diabetes mellitus without complications: Secondary | ICD-10-CM

## 2019-03-05 DIAGNOSIS — R234 Changes in skin texture: Secondary | ICD-10-CM | POA: Diagnosis not present

## 2019-03-05 DIAGNOSIS — M79671 Pain in right foot: Secondary | ICD-10-CM | POA: Diagnosis not present

## 2019-03-12 ENCOUNTER — Ambulatory Visit
Admission: RE | Admit: 2019-03-12 | Discharge: 2019-03-12 | Disposition: A | Discharge status: Home / Self Care | Payer: Medicare Other | Source: Ambulatory Visit | Attending: Geriatric Medicine | Admitting: Geriatric Medicine

## 2019-03-12 DIAGNOSIS — M542 Cervicalgia: Secondary | ICD-10-CM

## 2019-03-14 NOTE — Progress Notes (Signed)
Subjective: 73 year old female presents the office today for concerns of a possible blister, cracked skin to the posterior lateral aspect of the right heel.  She states this happened about a week ago and she has noticed some redness to the area.  She states that it was the same as last time but not as bad.  Denies any drainage or pus.  Area is uncomfortable with pressure.  No recent injury or other concerns. Denies any systemic complaints such as fevers, chills, nausea, vomiting. No acute changes since last appointment, and no other complaints at this time.   Objective: AAO x3, NAD DP/PT pulses palpable bilaterally, CRT less than 3 seconds The posterior lateral aspect the right heel is an area of new, pink skin formation with some old hyperkeratotic tissue on the area.  Upon debridement there is no identification of any ulceration.  There is no open sores.  There is no drainage or pus or any signs of infection.  No open lesions or pre-ulcerative lesions.  No pain with calf compression, swelling, warmth, erythema  Assessment: Right heel preulcerative area  Plan: -All treatment options discussed with the patient including all alternatives, risks, complications.  -There is no sign of blister formation.  It did appear that there was some dried, cracked skin which I was able to debride and there is new, healthy pink skin present.  Recommend moisturizer daily and offloading.  Monitor closely for any skin breakdown. -Patient encouraged to call the office with any questions, concerns, change in symptoms.   Trula Slade DPM

## 2019-03-19 ENCOUNTER — Other Ambulatory Visit: Payer: Self-pay

## 2019-03-19 ENCOUNTER — Encounter: Payer: Self-pay | Admitting: Podiatry

## 2019-03-19 ENCOUNTER — Ambulatory Visit: Payer: Medicare PPO | Admitting: Podiatry

## 2019-03-19 ENCOUNTER — Ambulatory Visit: Payer: Medicare Other | Admitting: Podiatry

## 2019-03-19 VITALS — Temp 95.5°F

## 2019-03-19 DIAGNOSIS — R234 Changes in skin texture: Secondary | ICD-10-CM | POA: Diagnosis not present

## 2019-03-19 DIAGNOSIS — B351 Tinea unguium: Secondary | ICD-10-CM

## 2019-03-21 DIAGNOSIS — B351 Tinea unguium: Secondary | ICD-10-CM | POA: Insufficient documentation

## 2019-03-21 DIAGNOSIS — R234 Changes in skin texture: Secondary | ICD-10-CM | POA: Insufficient documentation

## 2019-03-21 NOTE — Progress Notes (Signed)
Subjective: 74 year old female presents the office today for follow evaluation of right heel preulcerative area but also she wants to discuss nail fungus treatments.  She states the right heel is doing much better and is resolved.  She does keep moisturizing the area daily.  She states that her husband trim the toenails and they have noticed the nails becoming thicker mostly on the big toes.  She will start treatment for nail fungus.  No pain in the nails no redness or drainage or any signs of infection. Denies any systemic complaints such as fevers, chills, nausea, vomiting. No acute changes since last appointment, and no other complaints at this time.   Objective: AAO x3, NAD DP/PT pulses palpable bilaterally, CRT less than 3 seconds On the lateral aspect of the right heel new skin is present but there is no skin breakdown or blister or skin fissures identified.  Bilateral hallux toenails are hypertrophic, dystrophic with yellow-brown discoloration there is no pain to the nails no signs of infection. No open lesions or pre-ulcerative lesions.  No pain with calf compression, swelling, warmth, erythema  Assessment: Preulcerative area right heel; onychomycosis  Plan: -All treatment options discussed with the patient including all alternatives, risks, complications.  -Area in the right heel is doing much better.  Continue offloading and moisturizer daily.  Monitoring skin breakdown or new skin fissures. -Order compound cream for onychomycosis today through [place].  If not able to get this will do [place]. -Patient encouraged to call the office with any questions, concerns, change in symptoms.   Vivi Barrack DPM

## 2019-04-15 NOTE — Procedures (Signed)
Lumbar Facet Joint Intra-Articular Injection(s) with Fluoroscopic Guidance  Patient: Linda Lucas      Date of Birth: 10/09/1945 MRN: [id number] PCP: Merlene Laughter, MD      Visit Date: 01/22/2019   Universal Protocol:    Date/Time: 01/22/2019  Consent Given By: the patient  Position: PRONE   Additional Comments: Vital signs were monitored before and after the procedure. Patient was prepped and draped in the usual sterile fashion. The correct patient, procedure, and site was verified.   Injection Procedure Details:  Procedure Site One Meds Administered:  Meds ordered this encounter  Medications  . methylPREDNISolone acetate (DEPO-MEDROL) injection 80 mg     Laterality: Bilateral  Location/Site:  L4-L5  Needle size: 22 guage  Needle type: Spinal  Needle Placement: Articular  Findings:  -Comments: Excellent flow of contrast producing a partial arthrogram.  Procedure Details: The fluoroscope beam is vertically oriented in AP, and the inferior recess is visualized beneath the lower pole of the inferior apophyseal process, which represents the target point for needle insertion. When direct visualization is difficult the target point is located at the medial projection of the vertebral pedicle. The region overlying each aforementioned target is locally anesthetized with a 1 to 2 ml. volume of 1% Lidocaine without Epinephrine.   The spinal needle was inserted into each of the above mentioned facet joints using biplanar fluoroscopic guidance. A 0.25 to 0.5 ml. volume of Isovue-250 was injected and a partial facet joint arthrogram was obtained. A single spot film was obtained of the resulting arthrogram.    One to 1.25 ml of the steroid/anesthetic solution was then injected into each of the facet joints noted above.   Additional Comments:  The patient tolerated the procedure well Dressing: 2 x 2 sterile gauze and Band-Aid    Post-procedure details: Patient was observed  during the procedure. Post-procedure instructions were reviewed.  Patient left the clinic in stable condition.

## 2019-04-30 ENCOUNTER — Other Ambulatory Visit: Payer: Self-pay | Admitting: *Deleted

## 2019-05-07 ENCOUNTER — Other Ambulatory Visit: Payer: Self-pay

## 2019-05-07 ENCOUNTER — Ambulatory Visit: Payer: Medicare PPO | Attending: Geriatric Medicine | Admitting: Physical Therapy

## 2019-05-07 ENCOUNTER — Encounter: Payer: Self-pay | Admitting: Physical Therapy

## 2019-05-07 DIAGNOSIS — M6281 Muscle weakness (generalized): Secondary | ICD-10-CM | POA: Diagnosis present

## 2019-05-07 DIAGNOSIS — M542 Cervicalgia: Secondary | ICD-10-CM | POA: Diagnosis present

## 2019-05-07 DIAGNOSIS — R2689 Other abnormalities of gait and mobility: Secondary | ICD-10-CM | POA: Diagnosis present

## 2019-05-07 NOTE — Therapy (Signed)
[place], [place], [address] Phone: [phone]   Fax:  [phone]  Physical Therapy Evaluation  Patient Details  Name: Linda Lucas MRN: [id number] Date of Birth: 17-Jun-1945 Referring Provider (PT): Lajean Manes, MD    Encounter Date: 05/07/2019  PT End of Session - 05/07/19 1602    Visit Number  1    Number of Visits  17    Date for PT Re-Evaluation  07/02/19    Authorization Type  [employer] Medicare: KX mod at 15th visit progress note at 10th    PT Start Time  1500    PT Stop Time  1545    PT Time Calculation (min)  45 min    Activity Tolerance  Patient tolerated treatment well    Behavior During Therapy  [facility] for tasks assessed/performed       Past Medical History:  Diagnosis Date  . Ankle fracture    bimeolar fracture  . Anxiety   . Chronic urethritis    frequent uti's and or urethritis  . Degenerative disc disease, lumbar 2007   bulging disc  . Depression   . Diabetes mellitus 2004  . Fibromyalgia   . Glaucoma   . Hypercholesteremia   . IBS (irritable bowel syndrome)   . 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 Migraine   . Osteoarthritis   . Overactive bladder   . Prolactin increased 05/2002   NORMAL MRI    Past Surgical History:  Procedure Laterality Date  . ABDOMINAL HYSTERECTOMY    . APPENDECTOMY  age 47  . BREAST BIOPSY     x 2  . BREAST EXCISIONAL BIOPSY Left 1994  . BREAST EXCISIONAL BIOPSY Left 1994  . BURCH PROCEDURE    . CARPECTOMY HAND     LEFT FOR KEINBACH'S DZ  . CATARACT EXTRACTION    . CHOLECYSTECTOMY OPEN     WITH CDE  . CYSTOSCOPY     X 4  . FRACTURE METARSAL    . FUSION OF DISCS     C 3-4 4-5 5-6 VERTBRAES IN NECK DUE TO DEGEN. [place] DZ  . FUSION OF LEFT WRIST WITH PLATE AND SCREWS  7412   CADAVER BONE GRAFT AND APPLICATION OF HUMAN GROWTH FACTORS  . HEMORROIDECTOMY  age 42  . HYSTEROSCOPY     D&C,HYSTEROSCOPY FOR PED FIBROID   . INTERSTIM IMPLANT PLACEMENT    . OPEN REDUCTION INTERNAL FIXATION (ORIF) PROXIMAL PHALANX Left 10/30/2018   Procedure: OPEN REDUCTION WITH FIXATION LEFT MIDDLE FINGER;  Surgeon: Daryll Brod, MD;  Location: [place];  Service: Orthopedics;  Laterality: Left;  . ORIF RADIAL HEAD / NECK FRACTURE  2010   RIGHT RADIAL HED EXTENDING INTO JOINT SPACE WITH PLATE AND SCREWS  . PERCUTANEOUS PINNING Left 01/31/2017   Procedure: PERCUTANEOUS PINNING EXTREMITYLEFT SMALL FINGER;  Surgeon: Daryll Brod, MD;  Location: [place];  Service: Orthopedics;  Laterality: Left;  . REPAIR EXTENSOR TENDON Left 10/30/2018   Procedure: EXTENSOR TENDON REPAIR;  Surgeon: Daryll Brod, MD;  Location: [place];  Service: Orthopedics;  Laterality: Left;  . RIGHT ANKLE FRACTURE    . ROTATOR CUFF REPAIR    . TONSILLECTOMY AND ADENOIDECTOMY    . TUBAL LIGATION    . WOUND EXPLORATION Left 10/30/2018  Procedure: EXPLORATION PROXIMAL INTERPHALANGEAL JOINT LEFT MIDDLE FINGER;  Surgeon: Cindee Salt, MD;  Location: [facility];  Service: Orthopedics;  Laterality: Left;    There were no vitals filed for this visit.   Subjective Assessment - 05/07/19 1510    Subjective  pt presents to OPPT with CC of neck pain that has been going on for over 6 months secondary to a fall where she hit her neck. Since onset the pain has stayed the same with no improvmeent. She reports significant hx of falling with pt estimated over 8 falls in the last 6 months. pain stays in neck, hx of migraines but reports they have improved over the last few months.    Limitations  Standing;Walking    How long can you sit comfortably?  1 hour    How long can you stand comfortably?  15 min with rollator    How long can you walk comfortably?  15 min with rollator     Diagnostic tests  03/12/2019 CT scan Impression: 1. No acute/traumatic cervical spine pathology.2. C4-C7 ACDF. The orthopedic hardware appears intact.    Patient Stated Goals  decrease pain in the neck, and to walk without the rollator or AD.    Currently in Pain?  Yes    Pain Score  5     Pain Location  Neck    Pain Orientation  Right;Left    Pain Descriptors / Indicators  Aching;Sore;Tightness    Pain Type  Chronic pain    Pain Onset  More than a month ago    Pain Frequency  Constant    Aggravating Factors   moving the neck around    Pain Relieving Factors  heating pad    Effect of Pain on Daily Activities  limited balance and stability in the legs, limited ROM and difficulties with ADLs    Multiple Pain Sites  Yes    Pain Score  3    Pain Location  Leg    Pain Orientation  Right;Left    Pain Descriptors / Indicators  Aching;Shooting    Pain Type  Chronic pain    Pain Frequency  Intermittent    Aggravating Factors   prolonged standing/ walking    Pain Relieving Factors  recliner         [facility] PT Assessment - 05/07/19 1517      Assessment   Medical Diagnosis  Cervicalgia M54.2, Other symptoms and signs involving the musculoskeletal system R29.898    Referring Provider (PT)  Merlene Laughter, MD     Onset Date/Surgical Date  --   6 months   Hand Dominance  Right    Next MD Visit  marching    Prior Therapy  yes      Precautions   Precautions  None      Restrictions   Weight Bearing Restrictions  No      Balance Screen   Has the patient fallen in the past 6 months  Yes    How many times?  8    Has the patient had a decrease in activity level because of a fear of falling?   Yes    Is the patient reluctant to leave their home because of a fear of falling?   Yes      Home Environment   Living Environment  Private residence    Living Arrangements  Spouse/significant other    Available Help at Discharge  Family    Type of Home  House    Home Access  Stairs to enter     Entrance Stairs-Number of Steps  1    Entrance Stairs-Rails  None    Home Layout  One level    Home Equipment  [place] - single point   rollator     Prior Function   Level of Independence  Independent with household mobility with device    Vocation  Retired      IT consultant   Overall Cognitive Status  Within Functional Limits for tasks assessed      Observation/Other Assessments   Focus on Therapeutic Outcomes (FOTO)   70% limited   predicted 56% limited     ROM / Strength   AROM / PROM / Strength  AROM;Strength;PROM      AROM   AROM Assessment Site  Cervical    Cervical Flexion  32    Cervical Extension  28    Cervical - Right Side Bend  32    Cervical - Left Side Bend  20   reproduced concordant pain    Cervical - Right Rotation  32    Cervical - Left Rotation  26   reproduced concordant pain      Strength   Strength Assessment Site  Hip;Knee    Right/Left Hip  Right;Left    Right Hip Flexion  3+/5    Right Hip Extension  4-/5    Right Hip ABduction  3+/5    Right Hip ADduction  3+/5    Left Hip Extension  4-/5    Left Hip ABduction  3+/5    Left Hip ADduction  3+/5    Right/Left Knee  Right;Left    Right Knee Flexion  4+/5    Right Knee Extension  4+/5    Left Knee Flexion  4+/5    Left Knee Extension  4+/5      Palpation   Palpation comment  TTP along bil upper trap R>L and levator with spasm noted in cervical paraspinals and sub-occipitals      Ambulation/Gait   Ambulation/Gait  Yes    Assistive device  Rollator    Gait Pattern  Decreased stride length;Trunk flexed;Antalgic;Trendelenburg                Objective measurements completed on examination: See above findings.      [facility] Adult PT Treatment/Exercise - 05/07/19 1517      Self-Care   Self-Care  Other Self-Care Comments    Other Self-Care Comments   proper from getting into / out of the car with rollator             PT Education - 05/07/19 1651    Education Details  evaluation  findings, POC, goals, HEp with proper form/ rationale. proper form of getting into / out of the car with rollator    Person(s) Educated  Patient    Methods  Explanation;Verbal cues;Handout    Comprehension  Verbalized understanding;Verbal cues required       PT Short Term Goals - 05/07/19 1702      PT SHORT TERM GOAL #1   Title  pt to be I with inital HEP    Time  4    Period  Weeks    Status  New    Target Date  06/04/19      PT SHORT TERM GOAL #2   Title  to asess BERG balance and make goals accordingly    Time  1    Period  Weeks    Status  New    Target Date  05/14/19      PT SHORT TERM GOAL #3   Title  pt to report having no falls for >/= 1 week to demonstrate improving function/ stability    Time  4    Period  Weeks    Status  New    Target Date  06/04/19      PT SHORT TERM GOAL #4   Title  pt to report decrease tension in the L upper trap and surrounding musculature to reduce pain to </= 4/10 and promote cervical ROM    Time  4    Period  Weeks    Status  New    Target Date  06/04/19        PT Long Term Goals - 05/07/19 1704      PT LONG TERM GOAL #1   Title  increase gross LE strength to >/= 4/5 to promote stability with walking / standing    Time  8    Period  Weeks    Status  New    Target Date  07/02/19      PT LONG TERM GOAL #2   Title  pt increase cervical L rotation/ sidebending by >/= 10 degrees with </= 2/10 pain for functional mobility for ADLs    Time  8    Period  Weeks    Status  New    Target Date  07/02/19      PT LONG TERM GOAL #3   Title  pt to be able to stand and walk >/= 30 min with LRAD for functional in home and community ambulation    Time  8    Period  Weeks    Status  New    Target Date  07/02/19      PT LONG TERM GOAL #4   Title  pt to report no falls or near falls for >/= 2 weeks to demonstrate improvement in function    Time  8    Period  Weeks    Status  New    Target Date  07/02/19      PT LONG TERM GOAL #5    Title  increase FOTO score to </=56% limited to demonstrated improvement in function    Time  8    Period  Weeks    Status  New    Target Date  07/02/19             Plan - 05/07/19 1654    Clinical Impression Statement  pt presents to OPPT with CC of neck pain starting 6 months ago secondary falling backward hitting her neck/ head. she has a significant hx of falling estimating > 8 int he last 6 months. She demonstrates limited cervical ROM and pain with L rotation and side bending. She exhibits weakness in bil hips and currently uses a Rollator for safety. She would benefit from physical therapy to decrease neck pain, improve bil LE strength, reduce falling/ maximize safety and function by addressing the deficits listed.    Personal Factors and Comorbidities  Comorbidity 3+;Age;Past/Current Experience;Social Background    Comorbidities  hx or multiple falls, DM, depression, fibromyalgia    Examination-Activity Limitations  Lift;Stand;Sit;Bed Mobility    Stability/Clinical Decision Making  Unstable/Unpredictable    Clinical Decision Making  High    Rehab Potential  Good    PT Frequency  2x / week    PT Duration  8 weeks    PT Treatment/Interventions  ADLs/Self Care Home Management;Cryotherapy;Electrical Stimulation;Iontophoresis 4mg /ml Dexamethasone;Moist Heat;Ultrasound;Therapeutic activities;Therapeutic exercise;Balance training;Neuromuscular re-education;Manual techniques;Patient/family education;Passive range of motion;Dry needling;Taping;DME Instruction;Gait training;Stair training    PT Next Visit Plan  review/ update HEP PRN, BERG balance (use gait belt), STW for the neck/ shoulder on the L, gross LE strengthening, proper use of DME    PT Home Exercise Plan  [id number] - chin tuck, upper trap stretch, glute set, LAQ, clamshells    Consulted and Agree with Plan of Care  Patient       Patient will benefit from skilled therapeutic intervention in order to improve the following  deficits and impairments:  Improper body mechanics, Increased muscle spasms, Pain, Decreased strength, Abnormal gait, Decreased activity tolerance, Decreased endurance, Postural dysfunction, Decreased balance, Decreased range of motion, Decreased knowledge of use of DME  Visit Diagnosis: Cervicalgia  Muscle weakness (generalized)  Other abnormalities of gait and mobility     Problem List Patient Active Problem List   Diagnosis Date Noted  . Onychomycosis 03/21/2019  . Skin fissure 03/21/2019  . Acute infection of nasal sinus 03/15/2018  . Yeast cystitis 03/14/2018  . Decreased range of motion of finger of left hand 03/06/2017  . Anxiety 02/15/2017  . Dislocation, finger, interphalangeal joint 01/30/2017  . Pain in finger of left hand 01/27/2017  . Chronic kidney disease, stage III (moderate) 08/13/2015  . Primary open angle glaucoma of both eyes, severe stage 06/29/2015  . Bronchitis 04/03/2015  . Essential (primary) hypertension 04/03/2015  . Fibromyositis 04/03/2015  . Peptic esophagitis 04/03/2015  . Hearing loss of both ears 04/03/2015  . History of colonic polyps 04/03/2015  . Hypothyroidism 04/03/2015  . Incontinence of feces 04/03/2015  . Migraine without aura and without status migrainosus, not intractable 04/03/2015  . Pain in joint 04/03/2015  . Recurrent major depressive episodes, in full remission (HCC) 04/03/2015  . Disorder of rotator cuff 11/26/2014  . Other specified postprocedural states 11/26/2014  . Difficulty walking 06/19/2014  . Lipodystrophy 02/13/2014  . Abnormal gait 10/15/2013  . At low risk for fall 10/15/2013  . Dyspareunia 07/25/2011  . Dysuria 07/25/2011  . Urge incontinence 07/25/2011  . Urinary urgency 07/25/2011  . Overactive bladder   . Migraine   . Fibromyalgia   . Chronic fatigue syndrome   . Metatarsal fracture   . Diabetes mellitus   . Ankle fracture   . Osteoarthritis   . IBS (irritable bowel syndrome)   . Depression   .  Chronic urethritis   . Degenerative disc disease, lumbar   . Hypercholesteremia   . Prolactin increased   . Major depression in partial remission (HCC) 01/04/2011  . Restless legs syndrome 01/04/2011    01/06/2011 PT, DPT, LAT, ATC  05/07/19  5:14 PM      [facility] [address] [place], [place], [place] Phone: [phone]   Fax:  [phone]  Name: Linda Lucas MRN: [id number] Date of Birth: 12-26-1945

## 2019-05-22 ENCOUNTER — Ambulatory Visit: Payer: Medicare PPO | Attending: Geriatric Medicine | Admitting: Physical Therapy

## 2019-05-22 ENCOUNTER — Encounter: Payer: Self-pay | Admitting: Physical Therapy

## 2019-05-22 ENCOUNTER — Other Ambulatory Visit: Payer: Self-pay

## 2019-05-22 DIAGNOSIS — R2689 Other abnormalities of gait and mobility: Secondary | ICD-10-CM | POA: Insufficient documentation

## 2019-05-22 DIAGNOSIS — M6281 Muscle weakness (generalized): Secondary | ICD-10-CM | POA: Insufficient documentation

## 2019-05-22 DIAGNOSIS — M542 Cervicalgia: Secondary | ICD-10-CM

## 2019-05-22 NOTE — Therapy (Signed)
[place], [place], [address] Phone: [phone]   Fax:  [phone]  Physical Therapy Treatment  Patient Details  Name: Linda Lucas MRN: [id number] Date of Birth: [birth year] Referring Provider (PT): Lajean Manes, MD    Encounter Date: 05/22/2019  PT End of Session - 05/22/19 1421    Visit Number  2    Number of Visits  17    Date for PT Re-Evaluation  07/02/19    Authorization Type  [employer]: KX mod at 15th visit    Progress Note Due on Visit  10    PT Start Time  1417    PT Stop Time  1500    PT Time Calculation (min)  43 min    Equipment Utilized During Treatment  Gait belt    Activity Tolerance  Patient tolerated treatment well    Behavior During Therapy  [facility] for tasks assessed/performed       Past Medical History:  Diagnosis Date  . Ankle fracture    bimeolar fracture  . Anxiety   . Chronic urethritis    frequent uti's and or urethritis  . Degenerative disc disease, lumbar 2007   bulging disc  . Depression   . Diabetes mellitus 2004  . Fibromyalgia   . Glaucoma   . Hypercholesteremia   . IBS (irritable bowel syndrome)   . 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 Migraine   . Osteoarthritis   . Overactive bladder   . Prolactin increased 05/2002   NORMAL MRI    Past Surgical History:  Procedure Laterality Date  . ABDOMINAL HYSTERECTOMY    . APPENDECTOMY  age 74  . BREAST BIOPSY     x 2  . BREAST EXCISIONAL BIOPSY Left 1994  . BREAST EXCISIONAL BIOPSY Left 1994  . BURCH PROCEDURE    . CARPECTOMY HAND     LEFT FOR KEINBACH'S DZ  . CATARACT EXTRACTION    . CHOLECYSTECTOMY OPEN     WITH CDE  . CYSTOSCOPY     X 4  . FRACTURE METARSAL    . FUSION OF DISCS     C 3-4 4-5 5-6 VERTBRAES IN NECK DUE TO DEGEN.  [place] DZ  . FUSION OF LEFT WRIST WITH PLATE AND SCREWS  0932   CADAVER BONE GRAFT AND APPLICATION OF HUMAN GROWTH FACTORS  . HEMORROIDECTOMY  age 74  . HYSTEROSCOPY     D&C,HYSTEROSCOPY FOR PED FIBROID   . INTERSTIM IMPLANT PLACEMENT    . OPEN REDUCTION INTERNAL FIXATION (ORIF) PROXIMAL PHALANX Left 10/30/2018   Procedure: OPEN REDUCTION WITH FIXATION LEFT MIDDLE FINGER;  Surgeon: Daryll Brod, MD;  Location: [place];  Service: Orthopedics;  Laterality: Left;  . ORIF RADIAL HEAD / NECK FRACTURE  2010   RIGHT RADIAL HED EXTENDING INTO JOINT SPACE WITH PLATE AND SCREWS  . PERCUTANEOUS PINNING Left 01/31/2017   Procedure: PERCUTANEOUS PINNING EXTREMITYLEFT SMALL FINGER;  Surgeon: Daryll Brod, MD;  Location: [place];  Service: Orthopedics;  Laterality: Left;  . REPAIR EXTENSOR TENDON Left 10/30/2018   Procedure: EXTENSOR TENDON REPAIR;  Surgeon: Daryll Brod, MD;  Location: [place];  Service: Orthopedics;  Laterality: Left;  . RIGHT ANKLE FRACTURE    . ROTATOR CUFF REPAIR    . TONSILLECTOMY  AND ADENOIDECTOMY    . TUBAL LIGATION    . WOUND EXPLORATION Left 10/30/2018   Procedure: EXPLORATION PROXIMAL INTERPHALANGEAL JOINT LEFT MIDDLE FINGER;  Surgeon: Cindee Salt, MD;  Location: [facility];  Service: Orthopedics;  Laterality: Left;    There were no vitals filed for this visit.  Subjective Assessment - 05/22/19 1422    Subjective  " I have had about 6 falls since the last session, and it usually occurs with reaching forward and I am not using any thing to help with balance"    Patient Stated Goals  decrease pain in the neck, and to walk without the rollator or AD.    Currently in Pain?  Yes    Pain Score  5     Pain Location  Neck    Pain Orientation  Right;Left    Aggravating Factors   moving th ehead    Pain Relieving Factors  resting/ heating pd         [facility] PT Assessment - 05/22/19 0001      Assessment   Medical  Diagnosis  Cervicalgia M54.2, Other symptoms and signs involving the musculoskeletal system R29.898    Referring Provider (PT)  Merlene Laughter, MD       Standardized Balance Assessment   Standardized Balance Assessment  Berg Balance Test      Berg Balance Test   Sit to Stand  Able to stand using hands after several tries    Standing Unsupported  Able to stand 2 minutes with supervision    Sitting with Back Unsupported but Feet Supported on Floor or Stool  Able to sit 2 minutes under supervision    Stand to Sit  Uses backs of legs against chair to control descent    Transfers  Needs one person to assist    Standing Unsupported with Eyes Closed  Able to stand 10 seconds with supervision    Standing Unsupported with Feet Together  Able to place feet together independently but unable to hold for 30 seconds    From Standing, Reach Forward with Outstretched Arm  Can reach forward >5 cm safely (2")    From Standing Position, Pick up Object from Floor  Unable to try/needs assist to keep balance    From Standing Position, Turn to Look Behind Over each Shoulder  Needs supervision when turning    Turn 360 Degrees  Needs assistance while turning    Standing Unsupported, Alternately Place Feet on Step/Stool  Needs assistance to keep from falling or unable to try    Standing Unsupported, One Foot in Front  Loses balance while stepping or standing    Standing on One Leg  Unable to try or needs assist to prevent fall    Total Score  19                   [facility] Adult PT Treatment/Exercise - 05/22/19 0001      Therapeutic Activites    Therapeutic Activities  Other Therapeutic Activities    Other Therapeutic Activities  transfer into/ out of vehicle using rollator to maximize safety with both pt and her husband      Exercises   Exercises  Neck      Neck Exercises: Supine   Neck Retraction  10 reps;5 secs      Manual Therapy   Manual Therapy  Manual Traction;Joint mobilization;Soft tissue  mobilization    Manual therapy comments  MTPR along bil iupper trap/ levator scapuale  x 2 ea and sub-occipital release    Joint Mobilization  cervical PA C5-C7     Soft tissue mobilization  IASTM aong upper trap    Manual Traction  cervical traction x 5 min      Neck Exercises: Stretches   Upper Trapezius Stretch  2 reps;30 seconds             PT Education - 05/22/19 1523    Education Details  based on BERG balance assessment she needs to keep her standard walker or rollator with her at all times.    Person(s) Educated  Patient    Methods  Explanation;Verbal cues;Handout    Comprehension  Verbalized understanding;Verbal cues required       PT Short Term Goals - 05/07/19 1702      PT SHORT TERM GOAL #1   Title  pt to be I with inital HEP    Time  4    Period  Weeks    Status  New    Target Date  06/04/19      PT SHORT TERM GOAL #2   Title  to asess BERG balance and make goals accordingly    Time  1    Period  Weeks    Status  New    Target Date  05/14/19      PT SHORT TERM GOAL #3   Title  pt to report having no falls for >/= 1 week to demonstrate improving function/ stability    Time  4    Period  Weeks    Status  New    Target Date  06/04/19      PT SHORT TERM GOAL #4   Title  pt to report decrease tension in the L upper trap and surrounding musculature to reduce pain to </= 4/10 and promote cervical ROM    Time  4    Period  Weeks    Status  New    Target Date  06/04/19        PT Long Term Goals - 05/07/19 1704      PT LONG TERM GOAL #1   Title  increase gross LE strength to >/= 4/5 to promote stability with walking / standing    Time  8    Period  Weeks    Status  New    Target Date  07/02/19      PT LONG TERM GOAL #2   Title  pt increase cervical L rotation/ sidebending by >/= 10 degrees with </= 2/10 pain for functional mobility for ADLs    Time  8    Period  Weeks    Status  New    Target Date  07/02/19      PT LONG TERM GOAL #3   Title   pt to be able to stand and walk >/= 30 min with LRAD for functional in home and community ambulation    Time  8    Period  Weeks    Status  New    Target Date  07/02/19      PT LONG TERM GOAL #4   Title  pt to report no falls or near falls for >/= 2 weeks to demonstrate improvement in function    Time  8    Period  Weeks    Status  New    Target Date  07/02/19      PT LONG TERM GOAL #5   Title  increase FOTO score  to </=56% limited to demonstrated improvement in function    Time  8    Period  Weeks    Status  New    Target Date  07/02/19            Plan - 05/22/19 1524    Clinical Impression Statement  pt reports having ~6 falls since the last session. BERG balance assessment she score a 19/56 suggesting high fall risk. Discussed importance of using SW or rollator all the time. focused on cervical mobs and soft tissue work along bil upper trap/levator scapuale. she rpeorted pain dropped to 1/10 in her neck end of session.    PT Next Visit Plan  review/ update HEP PRN,  STW for the neck/ shoulder on the L, gross LE strengthening, proper use of DME    PT Home Exercise Plan  ELPPTLWG - chin tuck, upper trap stretch, glute set, LAQ, clamshells       Patient will benefit from skilled therapeutic intervention in order to improve the following deficits and impairments:     Visit Diagnosis: Cervicalgia  Muscle weakness (generalized)  Other abnormalities of gait and mobility     Problem List Patient Active Problem List   Diagnosis Date Noted  . Onychomycosis 03/21/2019  . Skin fissure 03/21/2019  . Acute infection of nasal sinus 03/15/2018  . Yeast cystitis 03/14/2018  . Decreased range of motion of finger of left hand 03/06/2017  . Anxiety 02/15/2017  . Dislocation, finger, interphalangeal joint 01/30/2017  . Pain in finger of left hand 01/27/2017  . Chronic kidney disease, stage III (moderate) 08/13/2015  . Primary open angle glaucoma of both eyes, severe stage  06/29/2015  . Bronchitis 04/03/2015  . Essential (primary) hypertension 04/03/2015  . Fibromyositis 04/03/2015  . Peptic esophagitis 04/03/2015  . Hearing loss of both ears 04/03/2015  . History of colonic polyps 04/03/2015  . Hypothyroidism 04/03/2015  . Incontinence of feces 04/03/2015  . Migraine without aura and without status migrainosus, not intractable 04/03/2015  . Pain in joint 04/03/2015  . Recurrent major depressive episodes, in full remission (HCC) 04/03/2015  . Disorder of rotator cuff 11/26/2014  . Other specified postprocedural states 11/26/2014  . Difficulty walking 06/19/2014  . Lipodystrophy 02/13/2014  . Abnormal gait 10/15/2013  . At low risk for fall 10/15/2013  . Dyspareunia 07/25/2011  . Dysuria 07/25/2011  . Urge incontinence 07/25/2011  . Urinary urgency 07/25/2011  . Overactive bladder   . Migraine   . Fibromyalgia   . Chronic fatigue syndrome   . Metatarsal fracture   . Diabetes mellitus   . Ankle fracture   . Osteoarthritis   . IBS (irritable bowel syndrome)   . Depression   . Chronic urethritis   . Degenerative disc disease, lumbar   . Hypercholesteremia   . Prolactin increased   . Major depression in partial remission (HCC) 01/04/2011  . Restless legs syndrome 01/04/2011   Lulu Riding PT, DPT, LAT, ATC  05/22/19  3:36 PM      [facility] [address] [place], [place], [address] Phone: [phone]   Fax:  [phone]  Name: MARELLA VANDERPOL MRN: [id number] Date of Birth: 1945-07-13

## 2019-05-24 ENCOUNTER — Other Ambulatory Visit: Payer: Self-pay

## 2019-05-24 ENCOUNTER — Ambulatory Visit: Payer: Medicare PPO | Admitting: Physical Therapy

## 2019-05-24 DIAGNOSIS — R2689 Other abnormalities of gait and mobility: Secondary | ICD-10-CM

## 2019-05-24 DIAGNOSIS — M542 Cervicalgia: Secondary | ICD-10-CM

## 2019-05-24 DIAGNOSIS — M6281 Muscle weakness (generalized): Secondary | ICD-10-CM

## 2019-05-24 NOTE — Therapy (Signed)
[place], [place], [address] Phone: [phone]   Fax:  [phone]  Physical Therapy Treatment  Patient Details  Name: Linda Lucas MRN: [id number] Date of Birth: 03-13-1946 Referring Provider (PT): Lajean Manes, MD    Encounter Date: 05/24/2019  PT End of Session - 05/24/19 0957    Visit Number  3    Number of Visits  17    Date for PT Re-Evaluation  07/02/19    Progress Note Due on Visit  10    PT Start Time  0917    PT Stop Time  0958    PT Time Calculation (min)  41 min    Activity Tolerance  Patient tolerated treatment well    Behavior During Therapy  [facility] for tasks assessed/performed       Past Medical History:  Diagnosis Date  . Ankle fracture    bimeolar fracture  . Anxiety   . Chronic urethritis    frequent uti's and or urethritis  . Degenerative disc disease, lumbar 2007   bulging disc  . Depression   . Diabetes mellitus 2004  . Fibromyalgia   . Glaucoma   . Hypercholesteremia   . IBS (irritable bowel syndrome)   . 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 Migraine   . Osteoarthritis   . Overactive bladder   . Prolactin increased 05/2002   NORMAL MRI    Past Surgical History:  Procedure Laterality Date  . ABDOMINAL HYSTERECTOMY    . APPENDECTOMY  age 41  . BREAST BIOPSY     x 2  . BREAST EXCISIONAL BIOPSY Left 1994  . BREAST EXCISIONAL BIOPSY Left 1994  . BURCH PROCEDURE    . CARPECTOMY HAND     LEFT FOR KEINBACH'S DZ  . CATARACT EXTRACTION    . CHOLECYSTECTOMY OPEN     WITH CDE  . CYSTOSCOPY     X 4  . FRACTURE METARSAL    . FUSION OF DISCS     C 3-4 4-5 5-6 VERTBRAES IN NECK DUE TO DEGEN. [place] DZ  . FUSION OF LEFT WRIST WITH PLATE AND SCREWS  8676   CADAVER BONE GRAFT AND APPLICATION OF HUMAN  GROWTH FACTORS  . HEMORROIDECTOMY  age 41  . HYSTEROSCOPY     D&C,HYSTEROSCOPY FOR PED FIBROID   . INTERSTIM IMPLANT PLACEMENT    . OPEN REDUCTION INTERNAL FIXATION (ORIF) PROXIMAL PHALANX Left 10/30/2018   Procedure: OPEN REDUCTION WITH FIXATION LEFT MIDDLE FINGER;  Surgeon: Daryll Brod, MD;  Location: [place];  Service: Orthopedics;  Laterality: Left;  . ORIF RADIAL HEAD / NECK FRACTURE  2010   RIGHT RADIAL HED EXTENDING INTO JOINT SPACE WITH PLATE AND SCREWS  . PERCUTANEOUS PINNING Left 01/31/2017   Procedure: PERCUTANEOUS PINNING EXTREMITYLEFT SMALL FINGER;  Surgeon: Daryll Brod, MD;  Location: [place];  Service: Orthopedics;  Laterality: Left;  . REPAIR EXTENSOR TENDON Left 10/30/2018   Procedure: EXTENSOR TENDON REPAIR;  Surgeon: Daryll Brod, MD;  Location: [place];  Service: Orthopedics;  Laterality: Left;  . RIGHT ANKLE FRACTURE    . ROTATOR CUFF REPAIR    . TONSILLECTOMY AND ADENOIDECTOMY    . TUBAL LIGATION    . WOUND EXPLORATION Left 10/30/2018   Procedure: EXPLORATION PROXIMAL INTERPHALANGEAL JOINT  LEFT MIDDLE FINGER;  Surgeon: Cindee Salt, MD;  Location: [facility];  Service: Orthopedics;  Laterality: Left;    There were no vitals filed for this visit.  Subjective Assessment - 05/24/19 0920    Subjective  " I feel a little better today still some sorenessin the neck. I didn't have any falls since the last session and I used the walker inside at home"    Currently in Pain?  Yes    Pain Score  5     Pain Location  Neck    Pain Orientation  Left    Pain Descriptors / Indicators  Aching    Pain Type  Chronic pain    Pain Onset  More than a month ago         [facility] PT Assessment - 05/24/19 0001      Assessment   Medical Diagnosis  Cervicalgia M54.2, Other symptoms and signs involving the musculoskeletal system R29.898    Referring Provider (PT)  Merlene Laughter, MD                    [facility]  Adult PT Treatment/Exercise - 05/24/19 0001      Knee/Hip Exercises: Seated   Long Arc Quad  2 sets;10 reps;Weights    Long Arc Quad Weight  3 lbs.    Marching  2 sets;10 reps   3#   Sit to [employer]  2 sets;5 reps      Manual Therapy   Manual therapy comments  MTPR along bil iupper trap/ levator scapuale x 2 ea and sub-occipital release    Soft tissue mobilization  IASTM along upper trap          Balance Exercises - 05/24/19 0956      Balance Exercises: Standing   Standing Eyes Opened  Narrow base of support (BOS);30 secs;3 reps    Other Standing Exercises  seated balance on dyna disc 3 x 30 sec   mod postural sway          PT Short Term Goals - 05/07/19 1702      PT SHORT TERM GOAL #1   Title  pt to be I with inital HEP    Time  4    Period  Weeks    Status  New    Target Date  06/04/19      PT SHORT TERM GOAL #2   Title  to asess BERG balance and make goals accordingly    Time  1    Period  Weeks    Status  New    Target Date  05/14/19      PT SHORT TERM GOAL #3   Title  pt to report having no falls for >/= 1 week to demonstrate improving function/ stability    Time  4    Period  Weeks    Status  New    Target Date  06/04/19      PT SHORT TERM GOAL #4   Title  pt to report decrease tension in the L upper trap and surrounding musculature to reduce pain to </= 4/10 and promote cervical ROM    Time  4    Period  Weeks    Status  New    Target Date  06/04/19        PT Long Term Goals - 05/07/19 1704      PT LONG TERM GOAL #1   Title  increase gross LE strength to >/=  4/5 to promote stability with walking / standing    Time  8    Period  Weeks    Status  New    Target Date  07/02/19      PT LONG TERM GOAL #2   Title  pt increase cervical L rotation/ sidebending by >/= 10 degrees with </= 2/10 pain for functional mobility for ADLs    Time  8    Period  Weeks    Status  New    Target Date  07/02/19      PT LONG TERM GOAL #3   Title  pt to be able  to stand and walk >/= 30 min with LRAD for functional in home and community ambulation    Time  8    Period  Weeks    Status  New    Target Date  07/02/19      PT LONG TERM GOAL #4   Title  pt to report no falls or near falls for >/= 2 weeks to demonstrate improvement in function    Time  8    Period  Weeks    Status  New    Target Date  07/02/19      PT LONG TERM GOAL #5   Title  increase FOTO score to </=56% limited to demonstrated improvement in function    Time  8    Period  Weeks    Status  New    Target Date  07/02/19            Plan - 05/24/19 0931    Clinical Impression Statement  pt reports she has been consistent with using her standard walker at home and rpeorts no falls since the last session. continue working on STW along upper trap which she noted relief of tension follow stretching. worked on [employer] manager which she did well with but does fatigue quickly with.    PT Treatment/Interventions  ADLs/Self Care Home Management;Cryotherapy;Electrical Stimulation;Iontophoresis 4mg /ml Dexamethasone;Moist Heat;Ultrasound;Therapeutic activities;Therapeutic exercise;Balance training;Neuromuscular re-education;Manual techniques;Patient/family education;Passive range of motion;Dry needling;Taping;DME Instruction;Gait training;Stair training    PT Next Visit Plan  review/ update HEP PRN,  STW for the neck/ shoulder on the L, gross LE strengthening, proper use of DME    PT Home Exercise Plan  ELPPTLWG - chin tuck, upper trap stretch, glute set, LAQ, clamshells    Consulted and Agree with Plan of Care  Patient       Patient will benefit from skilled therapeutic intervention in order to improve the following deficits and impairments:  Improper body mechanics, Increased muscle spasms, Pain, Decreased strength, Abnormal gait, Decreased activity tolerance, Decreased endurance, Postural dysfunction, Decreased balance, Decreased range of motion, Decreased knowledge of use of  DME  Visit Diagnosis: No diagnosis found.     Problem List Patient Active Problem List   Diagnosis Date Noted  . Onychomycosis 03/21/2019  . Skin fissure 03/21/2019  . Acute infection of nasal sinus 03/15/2018  . Yeast cystitis 03/14/2018  . Decreased range of motion of finger of left hand 03/06/2017  . Anxiety 02/15/2017  . Dislocation, finger, interphalangeal joint 01/30/2017  . Pain in finger of left hand 01/27/2017  . Chronic kidney disease, stage III (moderate) 08/13/2015  . Primary open angle glaucoma of both eyes, severe stage 06/29/2015  . Bronchitis 04/03/2015  . Essential (primary) hypertension 04/03/2015  . Fibromyositis 04/03/2015  . Peptic esophagitis 04/03/2015  . Hearing loss of both ears 04/03/2015  . History of colonic polyps 04/03/2015  .  Hypothyroidism 04/03/2015  . Incontinence of feces 04/03/2015  . Migraine without aura and without status migrainosus, not intractable 04/03/2015  . Pain in joint 04/03/2015  . Recurrent major depressive episodes, in full remission (HCC) 04/03/2015  . Disorder of rotator cuff 11/26/2014  . Other specified postprocedural states 11/26/2014  . Difficulty walking 06/19/2014  . Lipodystrophy 02/13/2014  . Abnormal gait 10/15/2013  . At low risk for fall 10/15/2013  . Dyspareunia 07/25/2011  . Dysuria 07/25/2011  . Urge incontinence 07/25/2011  . Urinary urgency 07/25/2011  . Overactive bladder   . Migraine   . Fibromyalgia   . Chronic fatigue syndrome   . Metatarsal fracture   . Diabetes mellitus   . Ankle fracture   . Osteoarthritis   . IBS (irritable bowel syndrome)   . Depression   . Chronic urethritis   . Degenerative disc disease, lumbar   . Hypercholesteremia   . Prolactin increased   . Major depression in partial remission (HCC) 01/04/2011  . Restless legs syndrome 01/04/2011    Lulu Riding PT, DPT, LAT, ATC  05/24/19  9:58 AM      [facility] [address] [place], [place], [address] Phone: [phone]   Fax:  [phone]  Name: JENAVEE LAGUARDIA MRN: [id number] Date of Birth: 1945/12/28

## 2019-05-28 ENCOUNTER — Other Ambulatory Visit: Payer: Self-pay

## 2019-05-28 ENCOUNTER — Encounter: Payer: Self-pay | Admitting: Physical Therapy

## 2019-05-28 ENCOUNTER — Ambulatory Visit: Payer: Medicare PPO | Admitting: Physical Therapy

## 2019-05-28 DIAGNOSIS — R2689 Other abnormalities of gait and mobility: Secondary | ICD-10-CM

## 2019-05-28 DIAGNOSIS — M6281 Muscle weakness (generalized): Secondary | ICD-10-CM

## 2019-05-28 DIAGNOSIS — M542 Cervicalgia: Secondary | ICD-10-CM

## 2019-05-28 NOTE — Therapy (Signed)
[facility] Outpatient Rehabilitation [facility] [address] [place], [place], [address] Phone: [phone]   Fax:  [phone]  Physical Therapy Treatment  Patient Details  Name: Linda Lucas MRN: [id number] Date of Birth: 02-01-1946 Referring Provider (PT): Merlene Laughter, MD    Encounter Date: 05/28/2019  PT End of Session - 05/28/19 1146    Visit Number  4    Number of Visits  17    Date for PT Re-Evaluation  07/02/19    Authorization Type  [employer] Medicare: KX mod at 15th visit    Progress Note Due on Visit  10    PT Start Time  1142    PT Stop Time  1225    PT Time Calculation (min)  43 min    Activity Tolerance  Patient tolerated treatment well    Behavior During Therapy  [facility] for tasks assessed/performed       Past Medical History:  Diagnosis Date  . Ankle fracture    bimeolar fracture  . Anxiety   . Chronic urethritis    frequent uti's and or urethritis  . Degenerative disc disease, lumbar 2007   bulging disc  . Depression   . Diabetes mellitus 2004  . Fibromyalgia   . Glaucoma   . Hypercholesteremia   . IBS (irritable bowel syndrome)   . 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 Migraine   . Osteoarthritis   . Overactive bladder   . Prolactin increased 05/2002   NORMAL MRI    Past Surgical History:  Procedure Laterality Date  . ABDOMINAL HYSTERECTOMY    . APPENDECTOMY  age 35  . BREAST BIOPSY     x 2  . BREAST EXCISIONAL BIOPSY Left 1994  . BREAST EXCISIONAL BIOPSY Left 1994  . BURCH PROCEDURE    . CARPECTOMY HAND     LEFT FOR KEINBACH'S DZ  . CATARACT EXTRACTION    . CHOLECYSTECTOMY OPEN     WITH CDE  . CYSTOSCOPY     X 4  . FRACTURE METARSAL    . FUSION OF DISCS     C 3-4 4-5 5-6 VERTBRAES IN NECK DUE TO DEGEN. DISC DZ  . FUSION OF LEFT WRIST WITH PLATE AND  SCREWS  2009   CADAVER BONE GRAFT AND APPLICATION OF HUMAN GROWTH FACTORS  . HEMORROIDECTOMY  age 89  . HYSTEROSCOPY     D&C,HYSTEROSCOPY FOR PED FIBROID   . INTERSTIM IMPLANT PLACEMENT    . OPEN REDUCTION INTERNAL FIXATION (ORIF) PROXIMAL PHALANX Left 10/30/2018   Procedure: OPEN REDUCTION WITH FIXATION LEFT MIDDLE FINGER;  Surgeon: Cindee Salt, MD;  Location: [facility];  Service: Orthopedics;  Laterality: Left;  . ORIF RADIAL HEAD / NECK FRACTURE  2010   RIGHT RADIAL HED EXTENDING INTO JOINT SPACE WITH PLATE AND SCREWS  . PERCUTANEOUS PINNING Left 01/31/2017   Procedure: PERCUTANEOUS PINNING EXTREMITYLEFT SMALL FINGER;  Surgeon: Cindee Salt, MD;  Location: [facility];  Service: Orthopedics;  Laterality: Left;  . REPAIR EXTENSOR TENDON Left 10/30/2018   Procedure: EXTENSOR TENDON REPAIR;  Surgeon: Cindee Salt, MD;  Location:  SURGERY CENTER;  Service: Orthopedics;  Laterality: Left;  . RIGHT ANKLE FRACTURE    . ROTATOR CUFF REPAIR    . TONSILLECTOMY AND ADENOIDECTOMY    . TUBAL LIGATION    .  WOUND EXPLORATION Left 10/30/2018   Procedure: EXPLORATION PROXIMAL INTERPHALANGEAL JOINT LEFT MIDDLE FINGER;  Surgeon: Daryll Brod, MD;  Location: [place];  Service: Orthopedics;  Laterality: Left;    There were no vitals filed for this visit.  Subjective Assessment - 05/28/19 1148    Subjective  "I am feeling a little better today but still have 5/10 pain inthe neck but occurs mostly at the end of the day, I did have 1 fall the other day with my walker but it was because I dropped a drink"    Currently in Pain?  Yes    Pain Score  5     Pain Orientation  Left    Pain Descriptors / Indicators  Aching    Pain Type  Chronic pain    Pain Onset  More than a month ago    Pain Frequency  Intermittent                       [facility] Adult PT Treatment/Exercise - 05/28/19 0001      Neck Exercises: Supine   Neck Retraction  10  reps;5 secs      Knee/Hip Exercises: Seated   Sit to Sand  1 set;10 reps   cues on rocking forward with emphasis on nose over toes     Knee/Hip Exercises: Supine   Bridges  Strengthening;Both;2 sets;10 reps    Other Supine Knee/Hip Exercises  clamshell 2 x 10 with red theraband    Other Supine Knee/Hip Exercises  marching 2 x 10 with red theraband around the knees      Manual Therapy   Manual therapy comments  MTPR along L scalenes and upper trap     Joint Mobilization  cervical PA C5-C7 , L lateral gapping mobs grade III    Manual Traction  cervical traction x 5 min      Neck Exercises: Stretches   Upper Trapezius Stretch  2 reps;Left;30 seconds          Balance Exercises - 05/28/19 1227      Balance Exercises: Standing   Standing Eyes Opened  Narrow base of support (BOS);4 reps;20 secs   reaching forward outside BOS alt L/R         PT Short Term Goals - 05/07/19 1702      PT SHORT TERM GOAL #1   Title  pt to be I with inital HEP    Time  4    Period  Weeks    Status  New    Target Date  06/04/19      PT SHORT TERM GOAL #2   Title  to asess BERG balance and make goals accordingly    Time  1    Period  Weeks    Status  New    Target Date  05/14/19      PT SHORT TERM GOAL #3   Title  pt to report having no falls for >/= 1 week to demonstrate improving function/ stability    Time  4    Period  Weeks    Status  New    Target Date  06/04/19      PT SHORT TERM GOAL #4   Title  pt to report decrease tension in the L upper trap and surrounding musculature to reduce pain to </= 4/10 and promote cervical ROM    Time  4    Period  Weeks    Status  New  Target Date  06/04/19        PT Long Term Goals - 05/07/19 1704      PT LONG TERM GOAL #1   Title  increase gross LE strength to >/= 4/5 to promote stability with walking / standing    Time  8    Period  Weeks    Status  New    Target Date  07/02/19      PT LONG TERM GOAL #2   Title  pt increase  cervical L rotation/ sidebending by >/= 10 degrees with </= 2/10 pain for functional mobility for ADLs    Time  8    Period  Weeks    Status  New    Target Date  07/02/19      PT LONG TERM GOAL #3   Title  pt to be able to stand and walk >/= 30 min with LRAD for functional in home and community ambulation    Time  8    Period  Weeks    Status  New    Target Date  07/02/19      PT LONG TERM GOAL #4   Title  pt to report no falls or near falls for >/= 2 weeks to demonstrate improvement in function    Time  8    Period  Weeks    Status  New    Target Date  07/02/19      PT LONG TERM GOAL #5   Title  increase FOTO score to </=56% limited to demonstrated improvement in function    Time  8    Period  Weeks    Status  New    Target Date  07/02/19            Plan - 05/28/19 1230    Clinical Impression Statement  pt reports her neck is doing better but continues to worsen at the end of the day. she notes having 1 fall using her RW at home but states it is from dropping a cup and loosing her balance. continued STW for L upper trap/ scalenes and cervical mobs. continued hip strengthening and balance training with reaching forward outside of BOS requiring modA to maintain balance x 1. no increase in pain at end of session.    PT Next Visit Plan  review/ update HEP PRN,  STW for the neck/ shoulder on the L, gross LE strengthening, proper use of DME, updated HEP    PT Home Exercise Plan  ELPPTLWG - chin tuck, upper trap stretch, glute set, LAQ, clamshells    Consulted and Agree with Plan of Care  Patient       Patient will benefit from skilled therapeutic intervention in order to improve the following deficits and impairments:  Improper body mechanics, Increased muscle spasms, Pain, Decreased strength, Abnormal gait, Decreased activity tolerance, Decreased endurance, Postural dysfunction, Decreased balance, Decreased range of motion, Decreased knowledge of use of DME  Visit  Diagnosis: Cervicalgia  Muscle weakness (generalized)  Other abnormalities of gait and mobility     Problem List Patient Active Problem List   Diagnosis Date Noted  . Onychomycosis 03/21/2019  . Skin fissure 03/21/2019  . Acute infection of nasal sinus 03/15/2018  . Yeast cystitis 03/14/2018  . Decreased range of motion of finger of left hand 03/06/2017  . Anxiety 02/15/2017  . Dislocation, finger, interphalangeal joint 01/30/2017  . Pain in finger of left hand 01/27/2017  . Chronic kidney disease, stage III (moderate)  08/13/2015  . Primary open angle glaucoma of both eyes, severe stage 06/29/2015  . Bronchitis 04/03/2015  . Essential (primary) hypertension 04/03/2015  . Fibromyositis 04/03/2015  . Peptic esophagitis 04/03/2015  . Hearing loss of both ears 04/03/2015  . History of colonic polyps 04/03/2015  . Hypothyroidism 04/03/2015  . Incontinence of feces 04/03/2015  . Migraine without aura and without status migrainosus, not intractable 04/03/2015  . Pain in joint 04/03/2015  . Recurrent major depressive episodes, in full remission (HCC) 04/03/2015  . Disorder of rotator cuff 11/26/2014  . Other specified postprocedural states 11/26/2014  . Difficulty walking 06/19/2014  . Lipodystrophy 02/13/2014  . Abnormal gait 10/15/2013  . At low risk for fall 10/15/2013  . Dyspareunia 07/25/2011  . Dysuria 07/25/2011  . Urge incontinence 07/25/2011  . Urinary urgency 07/25/2011  . Overactive bladder   . Migraine   . Fibromyalgia   . Chronic fatigue syndrome   . Metatarsal fracture   . Diabetes mellitus   . Ankle fracture   . Osteoarthritis   . IBS (irritable bowel syndrome)   . Depression   . Chronic urethritis   . Degenerative disc disease, lumbar   . Hypercholesteremia   . Prolactin increased   . Major depression in partial remission (HCC) 01/04/2011  . Restless legs syndrome 01/04/2011    Lulu Riding PT, DPT, LAT, ATC  05/28/19  12:34  PM      [facility] [address] [place], [place], [address] Phone: [phone]   Fax:  [phone]  Name: Linda Lucas MRN: [id number] Date of Birth: Oct 26, 1945

## 2019-05-31 ENCOUNTER — Other Ambulatory Visit: Payer: Self-pay

## 2019-05-31 ENCOUNTER — Encounter: Payer: Self-pay | Admitting: Physical Therapy

## 2019-05-31 ENCOUNTER — Ambulatory Visit: Payer: Medicare PPO | Admitting: Physical Therapy

## 2019-05-31 DIAGNOSIS — M542 Cervicalgia: Secondary | ICD-10-CM | POA: Diagnosis not present

## 2019-05-31 DIAGNOSIS — R2689 Other abnormalities of gait and mobility: Secondary | ICD-10-CM

## 2019-05-31 DIAGNOSIS — M6281 Muscle weakness (generalized): Secondary | ICD-10-CM

## 2019-05-31 NOTE — Patient Instructions (Signed)

## 2019-05-31 NOTE — Therapy (Signed)
[facility] Outpatient Rehabilitation [facility] [address] [place], [place], [address] Phone: [phone]   Fax:  [phone]  Physical Therapy Treatment  Patient Details  Name: Linda Lucas MRN: [id number] Date of Birth: 31-Mar-1945 Referring Provider (PT): Merlene Laughter, MD    Encounter Date: 05/31/2019  PT End of Session - 05/31/19 0913    Visit Number  5    Number of Visits  17    Date for PT Re-Evaluation  07/02/19    Authorization Type  [employer] Medicare: KX mod at 15th visit    Progress Note Due on Visit  10    PT Start Time  0913    PT Stop Time  0958    PT Time Calculation (min)  45 min    Activity Tolerance  Patient tolerated treatment well    Behavior During Therapy  [facility] for tasks assessed/performed       Past Medical History:  Diagnosis Date  . Ankle fracture    bimeolar fracture  . Anxiety   . Chronic urethritis    frequent uti's and or urethritis  . Degenerative disc disease, lumbar 2007   bulging disc  . Depression   . Diabetes mellitus 2004  . Fibromyalgia   . Glaucoma   . Hypercholesteremia   . IBS (irritable bowel syndrome)   . 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 Migraine   . Osteoarthritis   . Overactive bladder   . Prolactin increased 05/2002   NORMAL MRI    Past Surgical History:  Procedure Laterality Date  . ABDOMINAL HYSTERECTOMY    . APPENDECTOMY  age 72  . BREAST BIOPSY     x 2  . BREAST EXCISIONAL BIOPSY Left 1994  . BREAST EXCISIONAL BIOPSY Left 1994  . BURCH PROCEDURE    . CARPECTOMY HAND     LEFT FOR KEINBACH'S DZ  . CATARACT EXTRACTION    . CHOLECYSTECTOMY OPEN     WITH CDE  . CYSTOSCOPY     X 4  . FRACTURE METARSAL    . FUSION OF DISCS     C 3-4 4-5 5-6 VERTBRAES IN NECK DUE TO DEGEN. DISC DZ  . FUSION OF LEFT WRIST WITH PLATE AND  SCREWS  2009   CADAVER BONE GRAFT AND APPLICATION OF HUMAN GROWTH FACTORS  . HEMORROIDECTOMY  age 89  . HYSTEROSCOPY     D&C,HYSTEROSCOPY FOR PED FIBROID   . INTERSTIM IMPLANT PLACEMENT    . OPEN REDUCTION INTERNAL FIXATION (ORIF) PROXIMAL PHALANX Left 10/30/2018   Procedure: OPEN REDUCTION WITH FIXATION LEFT MIDDLE FINGER;  Surgeon: Cindee Salt, MD;  Location: [facility];  Service: Orthopedics;  Laterality: Left;  . ORIF RADIAL HEAD / NECK FRACTURE  2010   RIGHT RADIAL HED EXTENDING INTO JOINT SPACE WITH PLATE AND SCREWS  . PERCUTANEOUS PINNING Left 01/31/2017   Procedure: PERCUTANEOUS PINNING EXTREMITYLEFT SMALL FINGER;  Surgeon: Cindee Salt, MD;  Location: [facility];  Service: Orthopedics;  Laterality: Left;  . REPAIR EXTENSOR TENDON Left 10/30/2018   Procedure: EXTENSOR TENDON REPAIR;  Surgeon: Cindee Salt, MD;  Location: [facility];  Service: Orthopedics;  Laterality: Left;  . RIGHT ANKLE FRACTURE    . ROTATOR CUFF REPAIR    . TONSILLECTOMY AND ADENOIDECTOMY    . TUBAL LIGATION    .  WOUND EXPLORATION Left 10/30/2018   Procedure: EXPLORATION PROXIMAL INTERPHALANGEAL JOINT LEFT MIDDLE FINGER;  Surgeon: Cindee Salt, MD;  Location: [facility];  Service: Orthopedics;  Laterality: Left;    There were no vitals filed for this visit.  Subjective Assessment - 05/31/19 0917    Subjective  " i am feeling much better inthe neck, still some soreness into the L shoulder blade. I didn't hav any falls since the last session"    Patient Stated Goals  decrease pain in the neck, and to walk without the rollator or AD.    Currently in Pain?  Yes    Pain Score  5     Pain Location  Shoulder    Pain Orientation  Left    Pain Descriptors / Indicators  Aching    Pain Type  Chronic pain    Pain Onset  More than a month ago    Pain Frequency  Intermittent    Aggravating Factors   LUE activity    Pain Relieving Factors  resting/ heating pad                        [facility] Adult PT Treatment/Exercise - 05/31/19 0001      Neck Exercises: Seated   Neck Retraction  10 reps;5 secs   with frequent tactile cues for proper form   Other Seated Exercise  scapular retraction 2 x 10 holding  2 sec    Other Seated Exercise  scaption 2 x 10 with frequent cues to keep the shoulders down       Knee/Hip Exercises: Seated   Long Arc Quad  2 sets;Weights;15 reps;Both    Long Arc Quad Weight  3 lbs.    Other Seated Knee/Hip Exercises  seated clamshells 2 x 15 with red theraband    Sit to Sand  2 sets;10 reps   cues for "nose over toes"     Manual Therapy   Manual therapy comments  MTPR along L scalenes and upper trap     Soft tissue mobilization  IASTM along upper trap      Neck Exercises: Stretches   Upper Trapezius Stretch  2 reps;Left;30 seconds             PT Education - 05/31/19 0940    Education Details  provided handout of for seated and standing posture.    Person(s) Educated  Patient    Methods  Explanation;Verbal cues;Handout    Comprehension  Verbalized understanding;Verbal cues required       PT Short Term Goals - 05/07/19 1702      PT SHORT TERM GOAL #1   Title  pt to be I with inital HEP    Time  4    Period  Weeks    Status  New    Target Date  06/04/19      PT SHORT TERM GOAL #2   Title  to asess BERG balance and make goals accordingly    Time  1    Period  Weeks    Status  New    Target Date  05/14/19      PT SHORT TERM GOAL #3   Title  pt to report having no falls for >/= 1 week to demonstrate improving function/ stability    Time  4    Period  Weeks    Status  New    Target Date  06/04/19      PT  SHORT TERM GOAL #4   Title  pt to report decrease tension in the L upper trap and surrounding musculature to reduce pain to </= 4/10 and promote cervical ROM    Time  4    Period  Weeks    Status  New    Target Date  06/04/19        PT Long Term Goals - 05/07/19 1704      PT  LONG TERM GOAL #1   Title  increase gross LE strength to >/= 4/5 to promote stability with walking / standing    Time  8    Period  Weeks    Status  New    Target Date  07/02/19      PT LONG TERM GOAL #2   Title  pt increase cervical L rotation/ sidebending by >/= 10 degrees with </= 2/10 pain for functional mobility for ADLs    Time  8    Period  Weeks    Status  New    Target Date  07/02/19      PT LONG TERM GOAL #3   Title  pt to be able to stand and walk >/= 30 min with LRAD for functional in home and community ambulation    Time  8    Period  Weeks    Status  New    Target Date  07/02/19      PT LONG TERM GOAL #4   Title  pt to report no falls or near falls for >/= 2 weeks to demonstrate improvement in function    Time  8    Period  Weeks    Status  New    Target Date  07/02/19      PT LONG TERM GOAL #5   Title  increase FOTO score to </=56% limited to demonstrated improvement in function    Time  8    Period  Weeks    Status  New    Target Date  07/02/19            Plan - 05/31/19 5573    Clinical Impression Statement  pt reports no falls since the last session and reports she feels the neck is improving but notes soreness in the L upper trap/ levator scapuale. worked on shoulder strengthenign which she required frequent cues to avoid hiking the shoulder up. continued hip / knee strengthening which she continues to do well.    PT Treatment/Interventions  ADLs/Self Care Home Management;Cryotherapy;Electrical Stimulation;Iontophoresis 4mg /ml Dexamethasone;Moist Heat;Ultrasound;Therapeutic activities;Therapeutic exercise;Balance training;Neuromuscular re-education;Manual techniques;Patient/family education;Passive range of motion;Dry needling;Taping;DME Instruction;Gait training;Stair training    PT Next Visit Plan  review/ update HEP PRN,  STW for the neck/ shoulder on the L, gross LE strengthening, proper use of DME, updated HEP    PT Home Exercise Plan  ELPPTLWG -  chin tuck, upper trap stretch, glute set, LAQ, clamshells    Consulted and Agree with Plan of Care  Patient       Patient will benefit from skilled therapeutic intervention in order to improve the following deficits and impairments:  Improper body mechanics, Increased muscle spasms, Pain, Decreased strength, Abnormal gait, Decreased activity tolerance, Decreased endurance, Postural dysfunction, Decreased balance, Decreased range of motion, Decreased knowledge of use of DME  Visit Diagnosis: Cervicalgia  Muscle weakness (generalized)  Other abnormalities of gait and mobility     Problem List Patient Active Problem List   Diagnosis Date Noted  . Onychomycosis 03/21/2019  . Skin  fissure 03/21/2019  . Acute infection of nasal sinus 03/15/2018  . Yeast cystitis 03/14/2018  . Decreased range of motion of finger of left hand 03/06/2017  . Anxiety 02/15/2017  . Dislocation, finger, interphalangeal joint 01/30/2017  . Pain in finger of left hand 01/27/2017  . Chronic kidney disease, stage III (moderate) 08/13/2015  . Primary open angle glaucoma of both eyes, severe stage 06/29/2015  . Bronchitis 04/03/2015  . Essential (primary) hypertension 04/03/2015  . Fibromyositis 04/03/2015  . Peptic esophagitis 04/03/2015  . Hearing loss of both ears 04/03/2015  . History of colonic polyps 04/03/2015  . Hypothyroidism 04/03/2015  . Incontinence of feces 04/03/2015  . Migraine without aura and without status migrainosus, not intractable 04/03/2015  . Pain in joint 04/03/2015  . Recurrent major depressive episodes, in full remission (HCC) 04/03/2015  . Disorder of rotator cuff 11/26/2014  . Other specified postprocedural states 11/26/2014  . Difficulty walking 06/19/2014  . Lipodystrophy 02/13/2014  . Abnormal gait 10/15/2013  . At low risk for fall 10/15/2013  . Dyspareunia 07/25/2011  . Dysuria 07/25/2011  . Urge incontinence 07/25/2011  . Urinary urgency 07/25/2011  . Overactive  bladder   . Migraine   . Fibromyalgia   . Chronic fatigue syndrome   . Metatarsal fracture   . Diabetes mellitus   . Ankle fracture   . Osteoarthritis   . IBS (irritable bowel syndrome)   . Depression   . Chronic urethritis   . Degenerative disc disease, lumbar   . Hypercholesteremia   . Prolactin increased   . Major depression in partial remission (HCC) 01/04/2011  . Restless legs syndrome 01/04/2011    Lulu Riding PT, DPT, LAT, ATC  05/31/19  10:25 AM      [facility] [address] [place], [place], [address] Phone: [phone]   Fax:  [phone]  Name: Linda Lucas MRN: [id number] Date of Birth: [birth year]

## 2019-06-04 ENCOUNTER — Ambulatory Visit: Payer: Medicare PPO | Admitting: Physical Therapy

## 2019-06-07 ENCOUNTER — Other Ambulatory Visit: Payer: Self-pay

## 2019-06-07 ENCOUNTER — Encounter: Payer: Self-pay | Admitting: Physical Therapy

## 2019-06-07 ENCOUNTER — Ambulatory Visit: Payer: Medicare PPO | Admitting: Physical Therapy

## 2019-06-07 DIAGNOSIS — M6281 Muscle weakness (generalized): Secondary | ICD-10-CM

## 2019-06-07 DIAGNOSIS — M542 Cervicalgia: Secondary | ICD-10-CM | POA: Diagnosis not present

## 2019-06-07 DIAGNOSIS — R2689 Other abnormalities of gait and mobility: Secondary | ICD-10-CM

## 2019-06-07 NOTE — Therapy (Signed)
[facility] [address] [place], [place], [address] Phone: [phone]   Fax:  [phone]  Physical Therapy Treatment  Patient Details  Name: Linda Lucas MRN: [id number] Date of Birth: [birth year] Referring Provider (PT): Merlene Laughter, MD    Encounter Date: 06/07/2019  PT End of Session - 06/07/19 0921    Visit Number  6    Number of Visits  17    Date for PT Re-Evaluation  07/02/19    Authorization Type  [employer] Medicare: KX mod at 15th visit    PT Start Time  0918    PT Stop Time  0956    PT Time Calculation (min)  38 min    Activity Tolerance  Patient tolerated treatment well    Behavior During Therapy  [facility] for tasks assessed/performed       Past Medical History:  Diagnosis Date  . Ankle fracture    bimeolar fracture  . Anxiety   . Chronic urethritis    frequent uti's and or urethritis  . Degenerative disc disease, lumbar 2007   bulging disc  . Depression   . Diabetes mellitus 2004  . Fibromyalgia   . Glaucoma   . Hypercholesteremia   . IBS (irritable bowel syndrome)   . 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 Migraine   . Osteoarthritis   . Overactive bladder   . Prolactin increased 05/2002   NORMAL MRI    Past Surgical History:  Procedure Laterality Date  . ABDOMINAL HYSTERECTOMY    . APPENDECTOMY  age 63  . BREAST BIOPSY     x 2  . BREAST EXCISIONAL BIOPSY Left 1994  . BREAST EXCISIONAL BIOPSY Left 1994  . BURCH PROCEDURE    . CARPECTOMY HAND     LEFT FOR KEINBACH'S DZ  . CATARACT EXTRACTION    . CHOLECYSTECTOMY OPEN     WITH CDE  . CYSTOSCOPY     X 4  . FRACTURE METARSAL    . FUSION OF DISCS     C 3-4 4-5 5-6 VERTBRAES IN NECK DUE TO DEGEN. DISC DZ  . FUSION OF LEFT WRIST WITH PLATE AND SCREWS  2009   CADAVER BONE GRAFT AND  APPLICATION OF HUMAN GROWTH FACTORS  . HEMORROIDECTOMY  age 19  . HYSTEROSCOPY     D&C,HYSTEROSCOPY FOR PED FIBROID   . INTERSTIM IMPLANT PLACEMENT    . OPEN REDUCTION INTERNAL FIXATION (ORIF) PROXIMAL PHALANX Left 10/30/2018   Procedure: OPEN REDUCTION WITH FIXATION LEFT MIDDLE FINGER;  Surgeon: Cindee Salt, MD;  Location: [facility];  Service: Orthopedics;  Laterality: Left;  . ORIF RADIAL HEAD / NECK FRACTURE  2010   RIGHT RADIAL HED EXTENDING INTO JOINT SPACE WITH PLATE AND SCREWS  . PERCUTANEOUS PINNING Left 01/31/2017   Procedure: PERCUTANEOUS PINNING EXTREMITYLEFT SMALL FINGER;  Surgeon: Cindee Salt, MD;  Location: [facility];  Service: Orthopedics;  Laterality: Left;  . REPAIR EXTENSOR TENDON Left 10/30/2018   Procedure: EXTENSOR TENDON REPAIR;  Surgeon: Cindee Salt, MD;  Location: [facility];  Service: Orthopedics;  Laterality: Left;  . RIGHT ANKLE FRACTURE    . ROTATOR CUFF REPAIR    . TONSILLECTOMY AND ADENOIDECTOMY    . TUBAL LIGATION    . WOUND EXPLORATION Left 10/30/2018   Procedure: EXPLORATION  PROXIMAL INTERPHALANGEAL JOINT LEFT MIDDLE FINGER;  Surgeon: Cindee Salt, MD;  Location:  SURGERY CENTER;  Service: Orthopedics;  Laterality: Left;    There were no vitals filed for this visit.  Subjective Assessment - 06/07/19 0922    Subjective  " I feel 3 times since the last session, and the last fall was a few days ago and I fell head first into the tub and it scared me. My neck is feeling more sore"    Diagnostic tests  03/12/2019 CT scan Impression: 1. No acute/traumatic cervical spine pathology.2. C4-C7 ACDF. The orthopedic hardware appears intact.    Patient Stated Goals  decrease pain in the neck, and to walk without the rollator or AD.    Currently in Pain?  Yes    Pain Score  5     Pain Location  Neck    Pain Orientation  Left    Pain Descriptors / Indicators  Aching    Pain Type  Chronic pain    Aggravating Factors    general         [facility] PT Assessment - 06/07/19 0001      Assessment   Medical Diagnosis  Cervicalgia M54.2, Other symptoms and signs involving the musculoskeletal system R29.898    Referring Provider (PT)  Merlene Laughter, MD                    [facility] Adult PT Treatment/Exercise - 06/07/19 0001      Neck Exercises: Theraband   Other Theraband Exercises  scapular retraction with bil ER 2 x 10 with red therabdn   increased verbal / tactile cues form issues with hearing aid     Neck Exercises: Supine   Neck Retraction  10 reps;5 secs      Knee/Hip Exercises: Seated   Sit to Sand  1 set;10 reps   cues to promote rocking foward and proper form     Knee/Hip Exercises: Supine   Bridges  1 set;10 reps    Other Supine Knee/Hip Exercises  clamshell 2 x 15 with red theraband    Other Supine Knee/Hip Exercises  marching 2 x 15 with red theraband around the knees      Neck Exercises: Stretches   Upper Trapezius Stretch  2 reps;Right;30 seconds    Levator Stretch  2 reps;30 seconds          Balance Exercises - 06/07/19 1012      Balance Exercises: Standing   Standing Eyes Opened  Narrow base of support (BOS);3 reps;30 secs   corner balance given as HEP       PT Education - 06/07/19 0941    Education Details  review importance of using RW due to increase in falling fequency and all falls occur when she isn't using an AD.    Person(s) Educated  Patient;Spouse    Methods  Explanation;Verbal cues    Comprehension  Verbalized understanding       PT Short Term Goals - 05/07/19 1702      PT SHORT TERM GOAL #1   Title  pt to be I with inital HEP    Time  4    Period  Weeks    Status  New    Target Date  06/04/19      PT SHORT TERM GOAL #2   Title  to asess BERG balance and make goals accordingly    Time  1    Period  Weeks  Status  New    Target Date  05/14/19      PT SHORT TERM GOAL #3   Title  pt to report having no falls for >/= 1 week to  demonstrate improving function/ stability    Time  4    Period  Weeks    Status  New    Target Date  06/04/19      PT SHORT TERM GOAL #4   Title  pt to report decrease tension in the L upper trap and surrounding musculature to reduce pain to </= 4/10 and promote cervical ROM    Time  4    Period  Weeks    Status  New    Target Date  06/04/19        PT Long Term Goals - 05/07/19 1704      PT LONG TERM GOAL #1   Title  increase gross LE strength to >/= 4/5 to promote stability with walking / standing    Time  8    Period  Weeks    Status  New    Target Date  07/02/19      PT LONG TERM GOAL #2   Title  pt increase cervical L rotation/ sidebending by >/= 10 degrees with </= 2/10 pain for functional mobility for ADLs    Time  8    Period  Weeks    Status  New    Target Date  07/02/19      PT LONG TERM GOAL #3   Title  pt to be able to stand and walk >/= 30 min with LRAD for functional in home and community ambulation    Time  8    Period  Weeks    Status  New    Target Date  07/02/19      PT LONG TERM GOAL #4   Title  pt to report no falls or near falls for >/= 2 weeks to demonstrate improvement in function    Time  8    Period  Weeks    Status  New    Target Date  07/02/19      PT LONG TERM GOAL #5   Title  increase FOTO score to </=56% limited to demonstrated improvement in function    Time  8    Period  Weeks    Status  New    Target Date  07/02/19            Plan - 06/07/19 0943    Clinical Impression Statement  pt arrives to PT noting 3 falls since the last session with the last one having her fall forward into the tub hitting her head. she denies HA, tinnitus, or other issues btu does have hematoma on the frontal bone. continued to discuss importance of using RW to reduce falling to prevent other potential.  Continued STW along the R upper trap and sub-occipitals, and scapular stabilizaers. continued hip and knee strengthening , which she    PT  Treatment/Interventions  ADLs/Self Care Home Management;Cryotherapy;Electrical Stimulation;Iontophoresis 4mg /ml Dexamethasone;Moist Heat;Ultrasound;Therapeutic activities;Therapeutic exercise;Balance training;Neuromuscular re-education;Manual techniques;Patient/family education;Passive range of motion;Dry needling;Taping;DME Instruction;Gait training;Stair training    PT Next Visit Plan  review/ update HEP PRN,  STW for the neck/ shoulder on the L, gross LE strengthening, proper use of DME, updated HEP    PT Home Exercise Plan  ELPPTLWG - chin tuck, upper trap stretch, glute set, LAQ, clamshells, corne rhomberg balance    Consulted and Agree  with Plan of Care  Patient       Patient will benefit from skilled therapeutic intervention in order to improve the following deficits and impairments:  Improper body mechanics, Increased muscle spasms, Pain, Decreased strength, Abnormal gait, Decreased activity tolerance, Decreased endurance, Postural dysfunction, Decreased balance, Decreased range of motion, Decreased knowledge of use of DME  Visit Diagnosis: Cervicalgia  Muscle weakness (generalized)  Other abnormalities of gait and mobility     Problem List Patient Active Problem List   Diagnosis Date Noted  . Onychomycosis 03/21/2019  . Skin fissure 03/21/2019  . Acute infection of nasal sinus 03/15/2018  . Yeast cystitis 03/14/2018  . Decreased range of motion of finger of left hand 03/06/2017  . Anxiety 02/15/2017  . Dislocation, finger, interphalangeal joint 01/30/2017  . Pain in finger of left hand 01/27/2017  . Chronic kidney disease, stage III (moderate) 08/13/2015  . Primary open angle glaucoma of both eyes, severe stage 06/29/2015  . Bronchitis 04/03/2015  . Essential (primary) hypertension 04/03/2015  . Fibromyositis 04/03/2015  . Peptic esophagitis 04/03/2015  . Hearing loss of both ears 04/03/2015  . History of colonic polyps 04/03/2015  . Hypothyroidism 04/03/2015  .  Incontinence of feces 04/03/2015  . Migraine without aura and without status migrainosus, not intractable 04/03/2015  . Pain in joint 04/03/2015  . Recurrent major depressive episodes, in full remission ([place]) 04/03/2015  . Disorder of rotator cuff 11/26/2014  . Other specified postprocedural states 11/26/2014  . Difficulty walking 06/19/2014  . Lipodystrophy 02/13/2014  . Abnormal gait 10/15/2013  . At low risk for fall 10/15/2013  . Dyspareunia 07/25/2011  . Dysuria 07/25/2011  . Urge incontinence 07/25/2011  . Urinary urgency 07/25/2011  . Overactive bladder   . Migraine   . Fibromyalgia   . Chronic fatigue syndrome   . Metatarsal fracture   . Diabetes mellitus   . Ankle fracture   . Osteoarthritis   . IBS (irritable bowel syndrome)   . Depression   . Chronic urethritis   . Degenerative disc disease, lumbar   . Hypercholesteremia   . Prolactin increased   . Major depression in partial remission ([place]) 01/04/2011  . Restless legs syndrome 01/04/2011   Starr Lake PT, DPT, LAT, ATC  06/07/19  10:15 AM      [facility] [address] [place], [place], [address] Phone: [phone]   Fax:  [phone]  Name: Linda Lucas MRN: [id number] Date of Birth: 1945-06-20

## 2019-06-11 ENCOUNTER — Other Ambulatory Visit: Payer: Self-pay | Admitting: Geriatric Medicine

## 2019-06-11 DIAGNOSIS — Z1231 Encounter for screening mammogram for malignant neoplasm of breast: Secondary | ICD-10-CM

## 2019-06-18 ENCOUNTER — Encounter: Payer: Self-pay | Admitting: Physical Therapy

## 2019-06-18 ENCOUNTER — Other Ambulatory Visit: Payer: Self-pay

## 2019-06-18 ENCOUNTER — Ambulatory Visit: Payer: Medicare PPO | Attending: Geriatric Medicine | Admitting: Physical Therapy

## 2019-06-18 DIAGNOSIS — M542 Cervicalgia: Secondary | ICD-10-CM | POA: Insufficient documentation

## 2019-06-18 DIAGNOSIS — R2689 Other abnormalities of gait and mobility: Secondary | ICD-10-CM | POA: Diagnosis present

## 2019-06-18 DIAGNOSIS — M6281 Muscle weakness (generalized): Secondary | ICD-10-CM | POA: Diagnosis present

## 2019-06-18 NOTE — Therapy (Addendum)
[facility] [address] [place], [place], [address] Phone: [phone]   Fax:  [phone]  Physical Therapy Treatment  Patient Details  Name: Linda Lucas MRN: [id number] Date of Birth: [birth year] Referring Provider (PT): Merlene Laughter, MD    Encounter Date: 06/18/2019  PT End of Session - 06/18/19 0848    Visit Number  7    Number of Visits  17    Date for PT Re-Evaluation  07/02/19    Authorization Type  [employer] Medicare: KX mod at 15th visit    Progress Note Due on Visit  10    PT Start Time  0848    PT Stop Time  0927    PT Time Calculation (min)  39 min    Equipment Utilized During Treatment  Gait belt    Activity Tolerance  Patient tolerated treatment well    Behavior During Therapy  [facility] for tasks assessed/performed       Past Medical History:  Diagnosis Date  . Ankle fracture    bimeolar fracture  . Anxiety   . Chronic urethritis    frequent uti's and or urethritis  . Degenerative disc disease, lumbar 2007   bulging disc  . Depression   . Diabetes mellitus 2004  . Fibromyalgia   . Glaucoma   . Hypercholesteremia   . IBS (irritable bowel syndrome)   . 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 Migraine   . Osteoarthritis   . Overactive bladder   . Prolactin increased 05/2002   NORMAL MRI    Past Surgical History:  Procedure Laterality Date  . ABDOMINAL HYSTERECTOMY    . APPENDECTOMY  age 60  . BREAST BIOPSY     x 2  . BREAST EXCISIONAL BIOPSY Left 1994  . BREAST EXCISIONAL BIOPSY Left 1994  . BURCH PROCEDURE    . CARPECTOMY HAND     LEFT FOR KEINBACH'S DZ  . CATARACT EXTRACTION    . CHOLECYSTECTOMY OPEN     WITH CDE  . CYSTOSCOPY     X 4  . FRACTURE METARSAL    . FUSION OF DISCS     C 3-4 4-5 5-6 VERTBRAES IN NECK DUE TO DEGEN.  DISC DZ  . FUSION OF LEFT WRIST WITH PLATE AND SCREWS  2009   CADAVER BONE GRAFT AND APPLICATION OF HUMAN GROWTH FACTORS  . HEMORROIDECTOMY  age 35  . HYSTEROSCOPY     D&C,HYSTEROSCOPY FOR PED FIBROID   . INTERSTIM IMPLANT PLACEMENT    . OPEN REDUCTION INTERNAL FIXATION (ORIF) PROXIMAL PHALANX Left 10/30/2018   Procedure: OPEN REDUCTION WITH FIXATION LEFT MIDDLE FINGER;  Surgeon: Cindee Salt, MD;  Location: [facility];  Service: Orthopedics;  Laterality: Left;  . ORIF RADIAL HEAD / NECK FRACTURE  2010   RIGHT RADIAL HED EXTENDING INTO JOINT SPACE WITH PLATE AND SCREWS  . PERCUTANEOUS PINNING Left 01/31/2017   Procedure: PERCUTANEOUS PINNING EXTREMITYLEFT SMALL FINGER;  Surgeon: Cindee Salt, MD;  Location: [facility];  Service: Orthopedics;  Laterality: Left;  . REPAIR EXTENSOR TENDON Left 10/30/2018   Procedure: EXTENSOR TENDON REPAIR;  Surgeon: Cindee Salt, MD;  Location:  SURGERY CENTER;  Service: Orthopedics;  Laterality: Left;  . RIGHT ANKLE FRACTURE    . ROTATOR CUFF REPAIR    . TONSILLECTOMY  AND ADENOIDECTOMY    . TUBAL LIGATION    . WOUND EXPLORATION Left 10/30/2018   Procedure: EXPLORATION PROXIMAL INTERPHALANGEAL JOINT LEFT MIDDLE FINGER;  Surgeon: Cindee Salt, MD;  Location: [facility];  Service: Orthopedics;  Laterality: Left;    There were no vitals filed for this visit.  Subjective Assessment - 06/18/19 0849    Subjective  " I fell 3 times this week. I don't know what is causing my falls, but I talked to a nurse and it could be related to my medicine and me shuffling". She reports that she had her walker on the side of her, but not in front when she fell.  She reports that her neck has an ache, but she felt better after the last session.    Currently in Pain?  Yes    Pain Score  6     Pain Location  Neck    Pain Orientation  Left    Pain Descriptors / Indicators  Aching    Pain Type  Chronic pain    Pain Onset  More  than a month ago    Pain Frequency  Intermittent    Aggravating Factors   general    Pain Relieving Factors  resting/ heating pad    Effect of Pain on Daily Activities  limited balance and stability in the legs, limited ROM and difficulties with ADLs    Multiple Pain Sites  No    Pain Score  4    Pain Location  Leg    Pain Orientation  Right;Left    Pain Descriptors / Indicators  Aching    Pain Type  Chronic pain    Pain Frequency  Intermittent                       [facility] Adult PT Treatment/Exercise - 06/18/19 0001      Transfers   Transfers  --      Neck Exercises: Theraband   Scapula Retraction  Red;15 reps      Neck Exercises: Supine   Neck Retraction  10 reps;3 secs;Limitations    Neck Retraction Limitations  w/ significant verbal and tactile cues       Knee/Hip Exercises: Seated   Sit to Sand  2 sets;5 reps   max cueing: Nose to toes, forward rocking, using mm. to push     Manual Therapy   Manual Therapy  Soft tissue mobilization    Manual therapy comments  MTPR along L scalenes and upper trap              PT Education - 06/18/19 0939    Education Details  Patient was educated on the importance of having rollator near to reduce falls. She was also educated on body mechanics during sit-to-stand transfers.    Person(s) Educated  Patient    Methods  Explanation;Verbal cues    Comprehension  Verbalized understanding       PT Short Term Goals - 05/07/19 1702      PT SHORT TERM GOAL #1   Title  pt to be I with inital HEP    Time  4    Period  Weeks    Status  New    Target Date  06/04/19      PT SHORT TERM GOAL #2   Title  to asess BERG balance and make goals accordingly    Time  1    Period  Weeks  Status  New    Target Date  05/14/19      PT SHORT TERM GOAL #3   Title  pt to report having no falls for >/= 1 week to demonstrate improving function/ stability    Time  4    Period  Weeks    Status  New    Target Date  06/04/19       PT SHORT TERM GOAL #4   Title  pt to report decrease tension in the L upper trap and surrounding musculature to reduce pain to </= 4/10 and promote cervical ROM    Time  4    Period  Weeks    Status  New    Target Date  06/04/19        PT Long Term Goals - 05/07/19 1704      PT LONG TERM GOAL #1   Title  increase gross LE strength to >/= 4/5 to promote stability with walking / standing    Time  8    Period  Weeks    Status  New    Target Date  07/02/19      PT LONG TERM GOAL #2   Title  pt increase cervical L rotation/ sidebending by >/= 10 degrees with </= 2/10 pain for functional mobility for ADLs    Time  8    Period  Weeks    Status  New    Target Date  07/02/19      PT LONG TERM GOAL #3   Title  pt to be able to stand and walk >/= 30 min with LRAD for functional in home and community ambulation    Time  8    Period  Weeks    Status  New    Target Date  07/02/19      PT LONG TERM GOAL #4   Title  pt to report no falls or near falls for >/= 2 weeks to demonstrate improvement in function    Time  8    Period  Weeks    Status  New    Target Date  07/02/19      PT LONG TERM GOAL #5   Title  increase FOTO score to </=56% limited to demonstrated improvement in function    Time  8    Period  Weeks    Status  New    Target Date  07/02/19            Plan - 06/18/19 0086    Clinical Impression Statement  Patient reports that she has fallen 3 times since the last session. She reports that her left side is tight and that she often gets migraines. Her scalenes, suboccipitals, and upper traps where tight, specifically on the left side. She required a great amount of cueing during therapeutic exercises. We discussed fall prevention techniques and worked on sit-to-stands for the purposes of transferring in order to perform functional tasks. Patient would benefit from PT in  order to further address balance, coordination, and strength deficits.    PT  Treatment/Interventions  ADLs/Self Care Home Management;Cryotherapy;Electrical Stimulation;Iontophoresis 4mg /ml Dexamethasone;Moist Heat;Ultrasound;Therapeutic activities;Therapeutic exercise;Balance training;Neuromuscular re-education;Manual techniques;Patient/family education;Passive range of motion;Dry needling;Taping;DME Instruction;Gait training;Stair training    PT Next Visit Plan  review/ update HEP PRN,  STW for the neck/ shoulder on the L, gross LE strengthening, proper use of DME, updated HEP, Balance    PT Home Exercise Plan  ELPPTLWG - chin tuck, upper trap stretch, glute set,  LAQ, clamshells, corne rhomberg balance    Consulted and Agree with Plan of Care  Patient       Patient will benefit from skilled therapeutic intervention in order to improve the following deficits and impairments:  Improper body mechanics, Increased muscle spasms, Pain, Decreased strength, Abnormal gait, Decreased activity tolerance, Decreased endurance, Postural dysfunction, Decreased balance, Decreased range of motion, Decreased knowledge of use of DME  Visit Diagnosis: Cervicalgia  Muscle weakness (generalized)  Other abnormalities of gait and mobility     Problem List Patient Active Problem List   Diagnosis Date Noted  . Onychomycosis 03/21/2019  . Skin fissure 03/21/2019  . Acute infection of nasal sinus 03/15/2018  . Yeast cystitis 03/14/2018  . Decreased range of motion of finger of left hand 03/06/2017  . Anxiety 02/15/2017  . Dislocation, finger, interphalangeal joint 01/30/2017  . Pain in finger of left hand 01/27/2017  . Chronic kidney disease, stage III (moderate) 08/13/2015  . Primary open angle glaucoma of both eyes, severe stage 06/29/2015  . Bronchitis 04/03/2015  . Essential (primary) hypertension 04/03/2015  . Fibromyositis 04/03/2015  . Peptic esophagitis 04/03/2015  . Hearing loss of both ears 04/03/2015  . History of colonic polyps 04/03/2015  . Hypothyroidism 04/03/2015   . Incontinence of feces 04/03/2015  . Migraine without aura and without status migrainosus, not intractable 04/03/2015  . Pain in joint 04/03/2015  . Recurrent major depressive episodes, in full remission ([facility]) 04/03/2015  . Disorder of rotator cuff 11/26/2014  . Other specified postprocedural states 11/26/2014  . Difficulty walking 06/19/2014  . Lipodystrophy 02/13/2014  . Abnormal gait 10/15/2013  . At low risk for fall 10/15/2013  . Dyspareunia 07/25/2011  . Dysuria 07/25/2011  . Urge incontinence 07/25/2011  . Urinary urgency 07/25/2011  . Overactive bladder   . Migraine   . Fibromyalgia   . Chronic fatigue syndrome   . Metatarsal fracture   . Diabetes mellitus   . Ankle fracture   . Osteoarthritis   . IBS (irritable bowel syndrome)   . Depression   . Chronic urethritis   . Degenerative disc disease, lumbar   . Hypercholesteremia   . Prolactin increased   . Major depression in partial remission ([facility]) 01/04/2011  . Restless legs syndrome 01/04/2011    Cato Mulligan, SPT  06/18/2019, 10:08 AM  [facility] [address] [place], [place], [address] Phone: [phone]   Fax:  [phone]  Name: Linda Lucas MRN: [id number] Date of Birth: [birth year]

## 2019-06-24 ENCOUNTER — Other Ambulatory Visit: Payer: Self-pay

## 2019-06-24 ENCOUNTER — Encounter: Payer: Self-pay | Admitting: Podiatry

## 2019-06-24 ENCOUNTER — Ambulatory Visit: Payer: Medicare PPO | Admitting: Podiatry

## 2019-06-24 DIAGNOSIS — R234 Changes in skin texture: Secondary | ICD-10-CM

## 2019-06-24 DIAGNOSIS — M79675 Pain in left toe(s): Secondary | ICD-10-CM

## 2019-06-24 DIAGNOSIS — B351 Tinea unguium: Secondary | ICD-10-CM | POA: Diagnosis not present

## 2019-06-24 DIAGNOSIS — M79674 Pain in right toe(s): Secondary | ICD-10-CM | POA: Diagnosis not present

## 2019-06-24 DIAGNOSIS — E119 Type 2 diabetes mellitus without complications: Secondary | ICD-10-CM

## 2019-06-28 NOTE — Progress Notes (Signed)
Subjective: 74 year old female presents the office today for evaluation of right heel preulcerative callus and for thick, elongated toenails that she cannot trim her self.  Denies any open sores and denies any redness or drainage or any swelling.   Denies any systemic complaints such as fevers, chills, nausea, vomiting. No acute changes since last appointment, and no other complaints at this time.   Objective: AAO x3, NAD-presents with husband DP/PT pulses palpable bilaterally, CRT less than 3 seconds Minimal hyperkeratotic tissue present to the right heel.  No underlying ulceration drainage or signs of infection.  No other open lesions or preulcerative lesions. Nails are hypertrophic, dystrophic, brittle, discolored, elongated 10. No surrounding redness or drainage. Tenderness nails 1-5 bilaterally.  No pain with calf compression, swelling, warmth, erythema  Assessment: Preulcerative callus right heel; symptomatic onychomycosis  Plan: -All treatment options discussed with the patient including all alternatives, risks, complications.  -Nails ablated x10 without complications or bleeding.  Continue topical antifungal -Hyperkeratotic lesion sharply debrided x1 without any complications or bleeding. -Patient encouraged to call the office with any questions, concerns, change in symptoms.   Vivi Barrack DPM

## 2019-07-01 ENCOUNTER — Other Ambulatory Visit: Payer: Self-pay

## 2019-07-01 ENCOUNTER — Ambulatory Visit: Payer: Medicare PPO | Admitting: Physical Therapy

## 2019-07-01 ENCOUNTER — Encounter: Payer: Self-pay | Admitting: Physical Therapy

## 2019-07-01 DIAGNOSIS — M542 Cervicalgia: Secondary | ICD-10-CM | POA: Diagnosis not present

## 2019-07-01 DIAGNOSIS — R2689 Other abnormalities of gait and mobility: Secondary | ICD-10-CM

## 2019-07-01 DIAGNOSIS — M6281 Muscle weakness (generalized): Secondary | ICD-10-CM

## 2019-07-01 NOTE — Therapy (Signed)
[place], [place], [address] Phone: [phone]   Fax:  [phone]  Physical Therapy Treatment  Patient Details  Name: Linda Lucas MRN: [id number] Date of Birth: 02-23-1946 Referring Provider (PT): Lajean Manes, MD    Encounter Date: 07/01/2019  PT End of Session - 07/01/19 1423    Visit Number  8    Number of Visits  17    Date for PT Re-Evaluation  07/02/19    Authorization Type  [employer] Medicare: KX mod at 15th visit    PT Start Time  1418    PT Stop Time  1459    PT Time Calculation (min)  41 min    Activity Tolerance  Patient tolerated treatment well    Behavior During Therapy  [facility] for tasks assessed/performed       Past Medical History:  Diagnosis Date  . Ankle fracture    bimeolar fracture  . Anxiety   . Chronic urethritis    frequent uti's and or urethritis  . Degenerative disc disease, lumbar 2007   bulging disc  . Depression   . Diabetes mellitus 2004  . Fibromyalgia   . Glaucoma   . Hypercholesteremia   . IBS (irritable bowel syndrome)   . 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 Migraine   . Osteoarthritis   . Overactive bladder   . Prolactin increased 05/2002   NORMAL MRI    Past Surgical History:  Procedure Laterality Date  . ABDOMINAL HYSTERECTOMY    . APPENDECTOMY  age 75  . BREAST BIOPSY     x 2  . BREAST EXCISIONAL BIOPSY Left 1994  . BREAST EXCISIONAL BIOPSY Left 1994  . BURCH PROCEDURE    . CARPECTOMY HAND     LEFT FOR KEINBACH'S DZ  . CATARACT EXTRACTION    . CHOLECYSTECTOMY OPEN     WITH CDE  . CYSTOSCOPY     X 4  . FRACTURE METARSAL    . FUSION OF DISCS     C 3-4 4-5 5-6 VERTBRAES IN NECK DUE TO DEGEN. [place] DZ  . FUSION OF LEFT WRIST WITH PLATE AND SCREWS  2703   CADAVER BONE GRAFT AND  APPLICATION OF HUMAN GROWTH FACTORS  . HEMORROIDECTOMY  age 1  . HYSTEROSCOPY     D&C,HYSTEROSCOPY FOR PED FIBROID   . INTERSTIM IMPLANT PLACEMENT    . OPEN REDUCTION INTERNAL FIXATION (ORIF) PROXIMAL PHALANX Left 10/30/2018   Procedure: OPEN REDUCTION WITH FIXATION LEFT MIDDLE FINGER;  Surgeon: Daryll Brod, MD;  Location: [facility];  Service: Orthopedics;  Laterality: Left;  . ORIF RADIAL HEAD / NECK FRACTURE  2010   RIGHT RADIAL HED EXTENDING INTO JOINT SPACE WITH PLATE AND SCREWS  . PERCUTANEOUS PINNING Left 01/31/2017   Procedure: PERCUTANEOUS PINNING EXTREMITYLEFT SMALL FINGER;  Surgeon: Daryll Brod, MD;  Location: [place];  Service: Orthopedics;  Laterality: Left;  . REPAIR EXTENSOR TENDON Left 10/30/2018   Procedure: EXTENSOR TENDON REPAIR;  Surgeon: Daryll Brod, MD;  Location: [place];  Service: Orthopedics;  Laterality: Left;  . RIGHT ANKLE FRACTURE    . ROTATOR CUFF REPAIR    . TONSILLECTOMY AND ADENOIDECTOMY    . TUBAL LIGATION    . WOUND EXPLORATION Left 10/30/2018   Procedure: EXPLORATION  PROXIMAL INTERPHALANGEAL JOINT LEFT MIDDLE FINGER;  Surgeon: Cindee Salt, MD;  Location: [facility];  Service: Orthopedics;  Laterality: Left;    There were no vitals filed for this visit.  Subjective Assessment - 07/01/19 1423    Subjective  " I had 5 falls last week, but this week I am doing better. I did have a fall that required EMS to help me get back up" pt notes her falls have occurred when she wasn't using her RW.    Diagnostic tests  03/12/2019 CT scan Impression: 1. No acute/traumatic cervical spine pathology.2. C4-C7 ACDF. The orthopedic hardware appears intact.    Currently in Pain?  Yes    Pain Score  6     Pain Location  Neck    Pain Orientation  Right;Left    Pain Descriptors / Indicators  Aching    Pain Type  Chronic pain    Pain Onset  More than a month ago    Pain Frequency  Intermittent                        [facility] Adult PT Treatment/Exercise - 07/01/19 0001      Neck Exercises: Seated   Neck Retraction  5 reps;10 secs   max cues for proper form     Knee/Hip Exercises: Seated   Other Seated Knee/Hip Exercises  seated clamshells 2 x 20 with green theraband    Other Seated Knee/Hip Exercises  seated marching 2 x 20 with red theraband around the knee    Sit to Sand  5 reps;without UE support;3 sets;with UE support   mod as     Manual Therapy   Manual therapy comments  MTPR along L scalenes and upper trap       Neck Exercises: Stretches   Upper Trapezius Stretch  2 reps;20 seconds;Right;Left          Balance Exercises - 07/01/19 1512      Balance Exercises: Standing   Standing Eyes Opened  Narrow base of support (BOS);3 reps;30 secs    Cone Rotation  Solid surface   rotating and reaching cone on elevated surface.          PT Short Term Goals - 05/07/19 1702      PT SHORT TERM GOAL #1   Title  pt to be I with inital HEP    Time  4    Period  Weeks    Status  New    Target Date  06/04/19      PT SHORT TERM GOAL #2   Title  to asess BERG balance and make goals accordingly    Time  1    Period  Weeks    Status  New    Target Date  05/14/19      PT SHORT TERM GOAL #3   Title  pt to report having no falls for >/= 1 week to demonstrate improving function/ stability    Time  4    Period  Weeks    Status  New    Target Date  06/04/19      PT SHORT TERM GOAL #4   Title  pt to report decrease tension in the L upper trap and surrounding musculature to reduce pain to </= 4/10 and promote cervical ROM    Time  4    Period  Weeks    Status  New    Target Date  06/04/19  PT Long Term Goals - 05/07/19 1704      PT LONG TERM GOAL #1   Title  increase gross LE strength to >/= 4/5 to promote stability with walking / standing    Time  8    Period  Weeks    Status  New    Target Date  07/02/19      PT LONG TERM GOAL #2   Title   pt increase cervical L rotation/ sidebending by >/= 10 degrees with </= 2/10 pain for functional mobility for ADLs    Time  8    Period  Weeks    Status  New    Target Date  07/02/19      PT LONG TERM GOAL #3   Title  pt to be able to stand and walk >/= 30 min with LRAD for functional in home and community ambulation    Time  8    Period  Weeks    Status  New    Target Date  07/02/19      PT LONG TERM GOAL #4   Title  pt to report no falls or near falls for >/= 2 weeks to demonstrate improvement in function    Time  8    Period  Weeks    Status  New    Target Date  07/02/19      PT LONG TERM GOAL #5   Title  increase FOTO score to </=56% limited to demonstrated improvement in function    Time  8    Period  Weeks    Status  New    Target Date  07/02/19            Plan - 07/01/19 1510    Clinical Impression Statement  pt continues to report neck pain secondary to frequent falls reporting 5 last week ,which she continues to report falling when she isn't using her RW. continued working sTW along the upper trap and posterior cevical structures, and continued working on strengthening of bil LE which she did well with and required cues for sit to stand. continued working on balance and reaching outside of BOS maintaining her balance.    PT Next Visit Plan  ERO next session, update HEP PRN,  STW for the neck/ shoulder on the L, gross LE strengthening, proper use of DME, updated HEP, Balance    PT Home Exercise Plan  ELPPTLWG - chin tuck, upper trap stretch, glute set, LAQ, clamshells, corne rhomberg balance    Consulted and Agree with Plan of Care  Patient       Patient will benefit from skilled therapeutic intervention in order to improve the following deficits and impairments:  Improper body mechanics, Increased muscle spasms, Pain, Decreased strength, Abnormal gait, Decreased activity tolerance, Decreased endurance, Postural dysfunction, Decreased balance, Decreased range of  motion, Decreased knowledge of use of DME  Visit Diagnosis: Cervicalgia  Muscle weakness (generalized)  Other abnormalities of gait and mobility     Problem List Patient Active Problem List   Diagnosis Date Noted  . Onychomycosis 03/21/2019  . Skin fissure 03/21/2019  . Acute infection of nasal sinus 03/15/2018  . Yeast cystitis 03/14/2018  . Decreased range of motion of finger of left hand 03/06/2017  . Anxiety 02/15/2017  . Dislocation, finger, interphalangeal joint 01/30/2017  . Pain in finger of left hand 01/27/2017  . Chronic kidney disease, stage III (moderate) 08/13/2015  . Primary open angle glaucoma of both eyes, severe stage  06/29/2015  . Bronchitis 04/03/2015  . Essential (primary) hypertension 04/03/2015  . Fibromyositis 04/03/2015  . Peptic esophagitis 04/03/2015  . Hearing loss of both ears 04/03/2015  . History of colonic polyps 04/03/2015  . Hypothyroidism 04/03/2015  . Incontinence of feces 04/03/2015  . Migraine without aura and without status migrainosus, not intractable 04/03/2015  . Pain in joint 04/03/2015  . Recurrent major depressive episodes, in full remission (HCC) 04/03/2015  . Disorder of rotator cuff 11/26/2014  . Other specified postprocedural states 11/26/2014  . Difficulty walking 06/19/2014  . Lipodystrophy 02/13/2014  . Abnormal gait 10/15/2013  . At low risk for fall 10/15/2013  . Dyspareunia 07/25/2011  . Dysuria 07/25/2011  . Urge incontinence 07/25/2011  . Urinary urgency 07/25/2011  . Overactive bladder   . Migraine   . Fibromyalgia   . Chronic fatigue syndrome   . Metatarsal fracture   . Diabetes mellitus   . Ankle fracture   . Osteoarthritis   . IBS (irritable bowel syndrome)   . Depression   . Chronic urethritis   . Degenerative disc disease, lumbar   . Hypercholesteremia   . Prolactin increased   . Major depression in partial remission (HCC) 01/04/2011  . Restless legs syndrome 01/04/2011   Lulu Riding PT,  DPT, LAT, ATC  07/01/19  3:43 PM      [facility] [address] [place], [place], [address] Phone: [phone]   Fax:  [phone]  Name: Linda Lucas MRN: [id number] Date of Birth: 10-Jul-1945

## 2019-07-03 ENCOUNTER — Other Ambulatory Visit: Payer: Self-pay

## 2019-07-03 ENCOUNTER — Ambulatory Visit: Payer: Medicare PPO | Admitting: Physical Therapy

## 2019-07-03 DIAGNOSIS — R2689 Other abnormalities of gait and mobility: Secondary | ICD-10-CM

## 2019-07-03 DIAGNOSIS — M6281 Muscle weakness (generalized): Secondary | ICD-10-CM

## 2019-07-03 DIAGNOSIS — M542 Cervicalgia: Secondary | ICD-10-CM | POA: Diagnosis not present

## 2019-07-03 NOTE — Therapy (Signed)
[facility] [address] [place], [place], [address] Phone: [phone]   Fax:  [phone]  Physical Therapy Treatment / Re-Certification Progress Note Reporting Period 05/07/2019  to 07/03/2019  See note below for Objective Data and Assessment of Progress/Goals.       Patient Details  Name: Linda Lucas MRN: [id number] Date of Birth: 10/17/1945 Referring Provider (PT): Merlene LaughterStoneking, Hal, MD    Encounter Date: 07/03/2019  PT End of Session - 07/03/19 1329    Visit Number  9    Number of Visits  17    Date for PT Re-Evaluation  08/14/19    Authorization Type  [employer] Medicare: KX mod at 15th visit    Progress Note Due on Visit  19    PT Start Time  1330    PT Stop Time  1413    PT Time Calculation (min)  43 min    Equipment Utilized During Treatment  Gait belt    Activity Tolerance  Patient tolerated treatment well    Behavior During Therapy  WFL for tasks assessed/performed       Past Medical History:  Diagnosis Date  . Ankle fracture    bimeolar fracture  . Anxiety   . Chronic urethritis    frequent uti's and or urethritis  . Degenerative disc disease, lumbar 2007   bulging disc  . Depression   . Diabetes mellitus 2004  . Fibromyalgia   . Glaucoma   . Hypercholesteremia   . IBS (irritable bowel syndrome)   . 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 Migraine   . Osteoarthritis   . Overactive bladder   . Prolactin increased 05/2002   NORMAL MRI    Past Surgical History:  Procedure Laterality Date  . ABDOMINAL HYSTERECTOMY    . APPENDECTOMY  age 74  . BREAST BIOPSY     x 2  . BREAST EXCISIONAL BIOPSY Left 1994  . BREAST EXCISIONAL BIOPSY Left 1994  . BURCH PROCEDURE    . CARPECTOMY HAND     LEFT FOR KEINBACH'S DZ  . CATARACT EXTRACTION     . CHOLECYSTECTOMY OPEN     WITH CDE  . CYSTOSCOPY     X 4  . FRACTURE METARSAL    . FUSION OF DISCS     C 3-4 4-5 5-6 VERTBRAES IN NECK DUE TO DEGEN. DISC DZ  . FUSION OF LEFT WRIST WITH PLATE AND SCREWS  2009   CADAVER BONE GRAFT AND APPLICATION OF HUMAN GROWTH FACTORS  . HEMORROIDECTOMY  age 74  . HYSTEROSCOPY     D&C,HYSTEROSCOPY FOR PED FIBROID   . INTERSTIM IMPLANT PLACEMENT    . OPEN REDUCTION INTERNAL FIXATION (ORIF) PROXIMAL PHALANX Left 10/30/2018   Procedure: OPEN REDUCTION WITH FIXATION LEFT MIDDLE FINGER;  Surgeon: Cindee SaltKuzma, Gary, MD;  Location: [facility];  Service: Orthopedics;  Laterality: Left;  . ORIF RADIAL HEAD / NECK FRACTURE  2010   RIGHT RADIAL HED EXTENDING INTO JOINT SPACE WITH PLATE AND SCREWS  . PERCUTANEOUS PINNING Left 01/31/2017   Procedure: PERCUTANEOUS PINNING EXTREMITYLEFT SMALL FINGER;  Surgeon: Cindee SaltKuzma, Gary, MD;  Location: [facility];  Service: Orthopedics;  Laterality: Left;  . REPAIR EXTENSOR TENDON Left 10/30/2018   Procedure: EXTENSOR TENDON REPAIR;  Surgeon: Cindee SaltKuzma, Gary, MD;  Location: [facility];  Service: Orthopedics;  Laterality: Left;  . RIGHT ANKLE FRACTURE    . ROTATOR CUFF REPAIR    . TONSILLECTOMY AND ADENOIDECTOMY    . TUBAL LIGATION    . WOUND EXPLORATION Left 10/30/2018   Procedure: EXPLORATION PROXIMAL INTERPHALANGEAL JOINT LEFT MIDDLE FINGER;  Surgeon: Cindee Salt, MD;  Location: [facility];  Service: Orthopedics;  Laterality: Left;    There were no vitals filed for this visit.  Subjective Assessment - 07/03/19 1335    Subjective  "I have had no falls this week, my neck is still hurting like crazy bother me down into my shoulder. the neck pain does get better following our session but it does"    Patient Stated Goals  decrease pain in the neck, and to walk without the rollator or AD.    Pain Score  6     Pain Location  Neck    Pain Orientation  Right;Left    Aggravating  Factors   anything bothers my neck    Pain Relieving Factors  resting, heating pad         [facility] PT Assessment - 07/03/19 1344      Assessment   Medical Diagnosis  Cervicalgia M54.2, Other symptoms and signs involving the musculoskeletal system R29.898    Referring Provider (PT)  Merlene Laughter, MD       AROM   Cervical Flexion  32    Cervical Extension  32    Cervical - Right Side Bend  40    Cervical - Left Side Bend  38    Cervical - Right Rotation  48    Cervical - Left Rotation  59      Berg Balance Test   Sit to Stand  Able to stand  independently using hands    Standing Unsupported  Able to stand safely 2 minutes    Sitting with Back Unsupported but Feet Supported on Floor or Stool  Able to sit safely and securely 2 minutes    Stand to Sit  Controls descent by using hands    Transfers  Able to transfer safely, definite need of hands    Standing Unsupported with Eyes Closed  Able to stand 10 seconds safely    Standing Unsupported with Feet Together  Able to place feet together independently and stand 1 minute safely    From Standing, Reach Forward with Outstretched Arm  Can reach forward >12 cm safely (5")    From Standing Position, Pick up Object from Floor  Able to pick up shoe, needs supervision    From Standing Position, Turn to Look Behind Over each Shoulder  Turn sideways only but maintains balance    Turn 360 Degrees  Able to turn 360 degrees safely but slowly    Standing Unsupported, Alternately Place Feet on Step/Stool  Able to complete >2 steps/needs minimal assist    Standing Unsupported, One Foot in [employer] balance while stepping or standing    Standing on One Leg  Unable to try or needs assist to prevent fall    Total Score  36                   [facility] Adult PT Treatment/Exercise - 07/03/19 0001      Self-Care   Self-Care  Posture    Posture  avoiding hiking the shoulder or keeping her head tilted to the side to avoid upper trap over  activation  Therapeutic Activites    Therapeutic Activities  Other Therapeutic Activities    Other Therapeutic Activities  stepping through narrow space using RW focusing on side stepping to promote safety and appropriate DME use with getting through door way and at home.       Neck Exercises: Stretches   Upper Trapezius Stretch  2 reps;30 seconds             PT Education - 07/03/19 1432    Education Details  reviewed HEP and benefits of using Walker at home. discussed with pt's husband to assist with posture to remind pt to keep shoulders down.    Person(s) Educated  Patient;Spouse    Methods  Explanation;Verbal cues;Handout    Comprehension  Verbalized understanding;Verbal cues required       PT Short Term Goals - 07/03/19 1339      PT SHORT TERM GOAL #1   Title  pt to be I with inital HEP    Period  Weeks    Status  Achieved      PT SHORT TERM GOAL #2   Title  to asess BERG balance and make goals accordingly    Period  Weeks    Status  Achieved      PT SHORT TERM GOAL #3   Title  pt to report having no falls for >/= 1 week to demonstrate improving function/ stability    Baseline  continues to have falls reporting 5 over the weekend 07/03/2019    Period  Weeks    Status  On-going    Target Date  07/25/19      PT SHORT TERM GOAL #4   Title  pt to report decrease tension in the L upper trap and surrounding musculature to reduce pain to </= 4/10 and promote cervical ROM    Period  Weeks    Status  On-going    Target Date  07/25/19        PT Long Term Goals - 07/03/19 1343      PT LONG TERM GOAL #1   Title  increase gross LE strength to >/= 4/5 to promote stability with walking / standing    Period  Weeks    Status  On-going      PT LONG TERM GOAL #2   Title  pt increase cervical L rotation/ sidebending by >/= 10 degrees with </= 2/10 pain for functional mobility for ADLs    Period  Weeks    Status  On-going      PT LONG TERM GOAL #3   Title  pt to  be able to stand and walk >/= 30 min with LRAD for functional in home and community ambulation    Period  Weeks    Status  On-going      PT LONG TERM GOAL #4   Title  pt to report no falls or near falls for >/= 2 weeks to demonstrate improvement in function    Period  Weeks    Status  On-going      PT LONG TERM GOAL #5   Title  increase FOTO score to </=56% limited to demonstrated improvement in function    Period  Weeks    Status  On-going      Additional Long Term Goals   Additional Long Term Goals  Yes      PT LONG TERM GOAL #6   Title  improve berg balance score to >/= 45/56 to demo improvement in balance and  safety with walking/ standing.    Baseline  19/ 56 initial berg    Period  Weeks    Status  On-going            Plan - 07/03/19 1420    Clinical Impression Statement  pt is making progress with physical therapy with increased cervical ROM. She does continue to note pain inthe bil upper traps and does continue to exhibit frequent shoulder hiking and keeping her head tilted to the side, requiring cues to avoid. She demonstrated improvement with BERG balance increased from 19/56 to 36/56 today. focused remainder of session navgivating narrow doorways with a RW to maximize usage and safety at home. She continues to make progress toward her goals and would benefit from cotninued physical therapy to decrease neck pain, improve bil LE strength, increase balance, and maximize DME use and safety by addressing the deficits listed.    PT Frequency  2x / week    PT Duration  4 weeks    PT Treatment/Interventions  ADLs/Self Care Home Management;Cryotherapy;Electrical Stimulation;Iontophoresis 4mg /ml Dexamethasone;Moist Heat;Ultrasound;Therapeutic activities;Therapeutic exercise;Balance training;Neuromuscular re-education;Manual techniques;Patient/family education;Passive range of motion;Dry needling;Taping;DME Instruction;Gait training;Stair training    PT Next Visit Plan  update HEP  PRN,  STW for the neck/ shoulder on the L, gross LE strengthening, proper use of DME did she use Walker at home?    PT Home Exercise Plan  ELPPTLWG - chin tuck, upper trap stretch, glute set, LAQ, clamshells, corne rhomberg balance    Consulted and Agree with Plan of Care  Patient       Patient will benefit from skilled therapeutic intervention in order to improve the following deficits and impairments:  Improper body mechanics, Increased muscle spasms, Pain, Decreased strength, Abnormal gait, Decreased activity tolerance, Decreased endurance, Postural dysfunction, Decreased balance, Decreased range of motion, Decreased knowledge of use of DME  Visit Diagnosis: Cervicalgia  Muscle weakness (generalized)  Other abnormalities of gait and mobility     Problem List Patient Active Problem List   Diagnosis Date Noted  . Onychomycosis 03/21/2019  . Skin fissure 03/21/2019  . Acute infection of nasal sinus 03/15/2018  . Yeast cystitis 03/14/2018  . Decreased range of motion of finger of left hand 03/06/2017  . Anxiety 02/15/2017  . Dislocation, finger, interphalangeal joint 01/30/2017  . Pain in finger of left hand 01/27/2017  . Chronic kidney disease, stage III (moderate) 08/13/2015  . Primary open angle glaucoma of both eyes, severe stage 06/29/2015  . Bronchitis 04/03/2015  . Essential (primary) hypertension 04/03/2015  . Fibromyositis 04/03/2015  . Peptic esophagitis 04/03/2015  . Hearing loss of both ears 04/03/2015  . History of colonic polyps 04/03/2015  . Hypothyroidism 04/03/2015  . Incontinence of feces 04/03/2015  . Migraine without aura and without status migrainosus, not intractable 04/03/2015  . Pain in joint 04/03/2015  . Recurrent major depressive episodes, in full remission ([place]) 04/03/2015  . Disorder of rotator cuff 11/26/2014  . Other specified postprocedural states 11/26/2014  . Difficulty walking 06/19/2014  . Lipodystrophy 02/13/2014  . Abnormal gait  10/15/2013  . At low risk for fall 10/15/2013  . Dyspareunia 07/25/2011  . Dysuria 07/25/2011  . Urge incontinence 07/25/2011  . Urinary urgency 07/25/2011  . Overactive bladder   . Migraine   . Fibromyalgia   . Chronic fatigue syndrome   . Metatarsal fracture   . Diabetes mellitus   . Ankle fracture   . Osteoarthritis   . IBS (irritable bowel syndrome)   . Depression   .  Chronic urethritis   . Degenerative disc disease, lumbar   . Hypercholesteremia   . Prolactin increased   . Major depression in partial remission (HCC) 01/04/2011  . Restless legs syndrome 01/04/2011   Linda Lucas PT, DPT, LAT, ATC  07/03/19  2:35 PM      [facility] [address] [place], [place], [address] Phone: [phone]   Fax:  [phone]  Name: Linda Lucas MRN: [id number] Date of Birth: [birth year]

## 2019-07-08 ENCOUNTER — Ambulatory Visit: Payer: Medicare PPO | Admitting: Physical Therapy

## 2019-07-08 ENCOUNTER — Other Ambulatory Visit: Payer: Self-pay

## 2019-07-08 DIAGNOSIS — M542 Cervicalgia: Secondary | ICD-10-CM

## 2019-07-08 DIAGNOSIS — R2689 Other abnormalities of gait and mobility: Secondary | ICD-10-CM

## 2019-07-08 DIAGNOSIS — M6281 Muscle weakness (generalized): Secondary | ICD-10-CM

## 2019-07-08 NOTE — Therapy (Signed)
[facility] Outpatient Rehabilitation [facility] [address] [place], [place], [address] Phone: [phone]   Fax:  [phone]  Physical Therapy Treatment  Patient Details  Name: Linda Lucas MRN: [id number] Date of Birth: [birth year] Referring Provider (PT): Merlene Laughter, MD    Encounter Date: 07/08/2019  PT End of Session - 07/08/19 1331    Visit Number  10    Number of Visits  17    Date for PT Re-Evaluation  08/14/19    Authorization Type  [employer] Medicare: KX mod at 15th visit    Progress Note Due on Visit  19    PT Start Time  1332    PT Stop Time  1415    PT Time Calculation (min)  43 min    Activity Tolerance  Patient tolerated treatment well    Behavior During Therapy  [facility] for tasks assessed/performed       Past Medical History:  Diagnosis Date  . Ankle fracture    bimeolar fracture  . Anxiety   . Chronic urethritis    frequent uti's and or urethritis  . Degenerative disc disease, lumbar 2007   bulging disc  . Depression   . Diabetes mellitus 2004  . Fibromyalgia   . Glaucoma   . Hypercholesteremia   . IBS (irritable bowel syndrome)   . 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 Migraine   . Osteoarthritis   . Overactive bladder   . Prolactin increased 05/2002   NORMAL MRI    Past Surgical History:  Procedure Laterality Date  . ABDOMINAL HYSTERECTOMY    . APPENDECTOMY  age 19  . BREAST BIOPSY     x 2  . BREAST EXCISIONAL BIOPSY Left 1994  . BREAST EXCISIONAL BIOPSY Left 1994  . BURCH PROCEDURE    . CARPECTOMY HAND     LEFT FOR KEINBACH'S DZ  . CATARACT EXTRACTION    . CHOLECYSTECTOMY OPEN     WITH CDE  . CYSTOSCOPY     X 4  . FRACTURE METARSAL    . FUSION OF DISCS     C 3-4 4-5 5-6 VERTBRAES IN NECK DUE TO DEGEN. DISC DZ  . FUSION OF LEFT WRIST WITH PLATE AND  SCREWS  2009   CADAVER BONE GRAFT AND APPLICATION OF HUMAN GROWTH FACTORS  . HEMORROIDECTOMY  age 50  . HYSTEROSCOPY     D&C,HYSTEROSCOPY FOR PED FIBROID   . INTERSTIM IMPLANT PLACEMENT    . OPEN REDUCTION INTERNAL FIXATION (ORIF) PROXIMAL PHALANX Left 10/30/2018   Procedure: OPEN REDUCTION WITH FIXATION LEFT MIDDLE FINGER;  Surgeon: Cindee Salt, MD;  Location: [facility];  Service: Orthopedics;  Laterality: Left;  . ORIF RADIAL HEAD / NECK FRACTURE  2010   RIGHT RADIAL HED EXTENDING INTO JOINT SPACE WITH PLATE AND SCREWS  . PERCUTANEOUS PINNING Left 01/31/2017   Procedure: PERCUTANEOUS PINNING EXTREMITYLEFT SMALL FINGER;  Surgeon: Cindee Salt, MD;  Location: [facility];  Service: Orthopedics;  Laterality: Left;  . REPAIR EXTENSOR TENDON Left 10/30/2018   Procedure: EXTENSOR TENDON REPAIR;  Surgeon: Cindee Salt, MD;  Location:  SURGERY CENTER;  Service: Orthopedics;  Laterality: Left;  . RIGHT ANKLE FRACTURE    . ROTATOR CUFF REPAIR    . TONSILLECTOMY AND ADENOIDECTOMY    . TUBAL LIGATION    .  WOUND EXPLORATION Left 10/30/2018   Procedure: EXPLORATION PROXIMAL INTERPHALANGEAL JOINT LEFT MIDDLE FINGER;  Surgeon: Daryll Brod, MD;  Location: [facility];  Service: Orthopedics;  Laterality: Left;    There were no vitals filed for this visit.  Subjective Assessment - 07/08/19 1333    Subjective  "I did fall this morning and it occurred when I went to get the door quickly and I fell forward. I ended up falling backward and hit my head on the ground"    Currently in Pain?  Yes    Pain Score  7     Pain Location  Neck    Pain Orientation  Right;Left    Pain Descriptors / Indicators  Aching    Pain Type  Chronic pain    Pain Onset  More than a month ago    Pain Frequency  Intermittent                       [facility] Adult PT Treatment/Exercise - 07/08/19 0001      Therapeutic Activites    Other Therapeutic Activities   stepping through narrow space using RW focusing on side stepping to promote safety and appropriate DME use with getting through door way and at home.  4 x 8 ft with clinic RW      Neck Exercises: Supine   Neck Retraction  10 reps;3 secs;Limitations   demonstration for proper form     Knee/Hip Exercises: Aerobic   Nustep  L5 x 10 min UE/LE   to promote endurance     Knee/Hip Exercises: Standing   Hip Flexion  Stengthening;2 sets;10 reps   using RW     Knee/Hip Exercises: Seated   Long Arc Quad  2 sets;20 reps;Both;Strengthening    Long Arc Quad Weight  3 lbs.    Other Seated Knee/Hip Exercises  seated marching 2 x 20 with red theraband around the knee   with 3#   Sit to Sand  10 reps;without UE support      Neck Exercises: Stretches   Upper Trapezius Stretch  2 reps;30 seconds;Right;Left          Balance Exercises - 07/08/19 1504      Balance Exercises: Standing   Standing Eyes Opened  Narrow base of support (BOS);3 reps;30 secs    Cone Rotation  Solid surface;Right turn;Left turn   reaching outside of BOS         PT Short Term Goals - 07/03/19 1339      PT SHORT TERM GOAL #1   Title  pt to be I with inital HEP    Period  Weeks    Status  Achieved      PT SHORT TERM GOAL #2   Title  to asess BERG balance and make goals accordingly    Period  Weeks    Status  Achieved      PT SHORT TERM GOAL #3   Title  pt to report having no falls for >/= 1 week to demonstrate improving function/ stability    Baseline  continues to have falls reporting 5 over the weekend 07/03/2019    Period  Weeks    Status  On-going    Target Date  07/25/19      PT SHORT TERM GOAL #4   Title  pt to report decrease tension in the L upper trap and surrounding musculature to reduce pain to </= 4/10 and promote cervical ROM  Period  Weeks    Status  On-going    Target Date  07/25/19        PT Long Term Goals - 07/03/19 1343      PT LONG TERM GOAL #1   Title  increase gross LE  strength to >/= 4/5 to promote stability with walking / standing    Period  Weeks    Status  On-going      PT LONG TERM GOAL #2   Title  pt increase cervical L rotation/ sidebending by >/= 10 degrees with </= 2/10 pain for functional mobility for ADLs    Period  Weeks    Status  On-going      PT LONG TERM GOAL #3   Title  pt to be able to stand and walk >/= 30 min with LRAD for functional in home and community ambulation    Period  Weeks    Status  On-going      PT LONG TERM GOAL #4   Title  pt to report no falls or near falls for >/= 2 weeks to demonstrate improvement in function    Period  Weeks    Status  On-going      PT LONG TERM GOAL #5   Title  increase FOTO score to </=56% limited to demonstrated improvement in function    Period  Weeks    Status  On-going      Additional Long Term Goals   Additional Long Term Goals  Yes      PT LONG TERM GOAL #6   Title  improve berg balance score to >/= 45/56 to demo improvement in balance and safety with walking/ standing.    Baseline  19/ 56 initial berg    Period  Weeks    Status  On-going            Plan - 07/08/19 1508    Clinical Impression Statement  pt reports she only had 1 fall in the last week which again occurred when she wasn't using her RW at home. continued working on hip / knee strength with increased reps to promote endurance. Continued  practicing balance training with reaching outside of her BOS which she did well with RW but had difficulty maintaining balance without an AD requiring mod/ max assist suggesting increased safety with a RW. practiced using the RW navigating through narrow thresholds which she did well with.    PT Treatment/Interventions  ADLs/Self Care Home Management;Cryotherapy;Electrical Stimulation;Iontophoresis 4mg /ml Dexamethasone;Moist Heat;Ultrasound;Therapeutic activities;Therapeutic exercise;Balance training;Neuromuscular re-education;Manual techniques;Patient/family education;Passive  range of motion;Dry needling;Taping;DME Instruction;Gait training;Stair training    PT Next Visit Plan  update HEP PRN,  STW for the neck/ shoulder on the L, gross LE strengthening, proper use of DME did she use Walker at home?    PT Home Exercise Plan  ELPPTLWG - chin tuck, upper trap stretch, glute set, LAQ, clamshells, corne rhomberg balance    Consulted and Agree with Plan of Care  Patient       Patient will benefit from skilled therapeutic intervention in order to improve the following deficits and impairments:  Improper body mechanics, Increased muscle spasms, Pain, Decreased strength, Abnormal gait, Decreased activity tolerance, Decreased endurance, Postural dysfunction, Decreased balance, Decreased range of motion, Decreased knowledge of use of DME  Visit Diagnosis: Cervicalgia  Muscle weakness (generalized)  Other abnormalities of gait and mobility     Problem List Patient Active Problem List   Diagnosis Date Noted  . Onychomycosis 03/21/2019  .  Skin fissure 03/21/2019  . Acute infection of nasal sinus 03/15/2018  . Yeast cystitis 03/14/2018  . Decreased range of motion of finger of left hand 03/06/2017  . Anxiety 02/15/2017  . Dislocation, finger, interphalangeal joint 01/30/2017  . Pain in finger of left hand 01/27/2017  . Chronic kidney disease, stage III (moderate) 08/13/2015  . Primary open angle glaucoma of both eyes, severe stage 06/29/2015  . Bronchitis 04/03/2015  . Essential (primary) hypertension 04/03/2015  . Fibromyositis 04/03/2015  . Peptic esophagitis 04/03/2015  . Hearing loss of both ears 04/03/2015  . History of colonic polyps 04/03/2015  . Hypothyroidism 04/03/2015  . Incontinence of feces 04/03/2015  . Migraine without aura and without status migrainosus, not intractable 04/03/2015  . Pain in joint 04/03/2015  . Recurrent major depressive episodes, in full remission (HCC) 04/03/2015  . Disorder of rotator cuff 11/26/2014  . Other specified  postprocedural states 11/26/2014  . Difficulty walking 06/19/2014  . Lipodystrophy 02/13/2014  . Abnormal gait 10/15/2013  . At low risk for fall 10/15/2013  . Dyspareunia 07/25/2011  . Dysuria 07/25/2011  . Urge incontinence 07/25/2011  . Urinary urgency 07/25/2011  . Overactive bladder   . Migraine   . Fibromyalgia   . Chronic fatigue syndrome   . Metatarsal fracture   . Diabetes mellitus   . Ankle fracture   . Osteoarthritis   . IBS (irritable bowel syndrome)   . Depression   . Chronic urethritis   . Degenerative disc disease, lumbar   . Hypercholesteremia   . Prolactin increased   . Major depression in partial remission (HCC) 01/04/2011  . Restless legs syndrome 01/04/2011   Lulu Riding PT, DPT, LAT, ATC  07/08/19  3:19 PM      [facility] Outpatient Rehabilitation [facility] [address] [place], [place], [address] Phone: [phone]   Fax:  [phone]  Name: Linda Lucas MRN: [id number] Date of Birth: 08-19-1945

## 2019-07-10 ENCOUNTER — Ambulatory Visit: Payer: Medicare PPO | Admitting: Physical Therapy

## 2019-07-10 ENCOUNTER — Other Ambulatory Visit: Payer: Self-pay

## 2019-07-10 DIAGNOSIS — M542 Cervicalgia: Secondary | ICD-10-CM

## 2019-07-10 DIAGNOSIS — R2689 Other abnormalities of gait and mobility: Secondary | ICD-10-CM

## 2019-07-10 DIAGNOSIS — M6281 Muscle weakness (generalized): Secondary | ICD-10-CM

## 2019-07-10 NOTE — Therapy (Signed)
[facility] Outpatient Rehabilitation [facility] [address] [place], [place], [address] Phone: [phone]   Fax:  [phone]  Physical Therapy Treatment  Patient Details  Name: Linda Lucas MRN: [id number] Date of Birth: May 24, 1945 Referring Provider (PT): Merlene Laughter, MD    Encounter Date: 07/10/2019  PT End of Session - 07/10/19 1321    Visit Number  11    Number of Visits  17    Date for PT Re-Evaluation  08/14/19    Authorization Type  [employer] Medicare: KX mod at 15th visit    Progress Note Due on Visit  19    PT Start Time  1328    PT Stop Time  1413    PT Time Calculation (min)  45 min    Activity Tolerance  Patient tolerated treatment well    Behavior During Therapy  [facility] for tasks assessed/performed       Past Medical History:  Diagnosis Date  . Ankle fracture    bimeolar fracture  . Anxiety   . Chronic urethritis    frequent uti's and or urethritis  . Degenerative disc disease, lumbar 2007   bulging disc  . Depression   . Diabetes mellitus 2004  . Fibromyalgia   . Glaucoma   . Hypercholesteremia   . IBS (irritable bowel syndrome)   . 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 Migraine   . Osteoarthritis   . Overactive bladder   . Prolactin increased 05/2002   NORMAL MRI    Past Surgical History:  Procedure Laterality Date  . ABDOMINAL HYSTERECTOMY    . APPENDECTOMY  age 74  . BREAST BIOPSY     x 2  . BREAST EXCISIONAL BIOPSY Left 1994  . BREAST EXCISIONAL BIOPSY Left 1994  . BURCH PROCEDURE    . CARPECTOMY HAND     LEFT FOR KEINBACH'S DZ  . CATARACT EXTRACTION    . CHOLECYSTECTOMY OPEN     WITH CDE  . CYSTOSCOPY     X 4  . FRACTURE METARSAL    . FUSION OF DISCS     C 3-4 4-5 5-6 VERTBRAES IN NECK DUE TO DEGEN. DISC DZ  . FUSION OF LEFT WRIST WITH PLATE AND  SCREWS  2009   CADAVER BONE GRAFT AND APPLICATION OF HUMAN GROWTH FACTORS  . HEMORROIDECTOMY  age 74  . HYSTEROSCOPY     D&C,HYSTEROSCOPY FOR PED FIBROID   . INTERSTIM IMPLANT PLACEMENT    . OPEN REDUCTION INTERNAL FIXATION (ORIF) PROXIMAL PHALANX Left 10/30/2018   Procedure: OPEN REDUCTION WITH FIXATION LEFT MIDDLE FINGER;  Surgeon: Cindee Salt, MD;  Location: [facility];  Service: Orthopedics;  Laterality: Left;  . ORIF RADIAL HEAD / NECK FRACTURE  2010   RIGHT RADIAL HED EXTENDING INTO JOINT SPACE WITH PLATE AND SCREWS  . PERCUTANEOUS PINNING Left 01/31/2017   Procedure: PERCUTANEOUS PINNING EXTREMITYLEFT SMALL FINGER;  Surgeon: Cindee Salt, MD;  Location: [facility];  Service: Orthopedics;  Laterality: Left;  . REPAIR EXTENSOR TENDON Left 10/30/2018   Procedure: EXTENSOR TENDON REPAIR;  Surgeon: Cindee Salt, MD;  Location: [facility];  Service: Orthopedics;  Laterality: Left;  . RIGHT ANKLE FRACTURE    . ROTATOR CUFF REPAIR    . TONSILLECTOMY AND ADENOIDECTOMY    . TUBAL LIGATION    .  WOUND EXPLORATION Left 10/30/2018   Procedure: EXPLORATION PROXIMAL INTERPHALANGEAL JOINT LEFT MIDDLE FINGER;  Surgeon: Cindee Salt, MD;  Location:  SUR[facility]ERY C[facility];  Service: Orthopedics;  Laterality: Left;    There were no vitals filed for this visit.  Subjective Assessment - 07/10/19 1330    Subjective  "I am doing better in the neck pain is about a 4/10 today, I think its more in the back of my head/ neck. I didn't have any falls since the last session, but I didn't use the RW    Patient Stated Goals  decrease pain in the neck, and to walk without the rollator or AD.    Currently in Pain?  Yes    Pain Score  4     Pain Location  Neck    Pain Orientation  Right;Left    Pain Descriptors / Indicators  Aching    Aggravating Factors   looking up and to the L/R, falling    Pain Relieving Factors  resting, heating pad         [facility] PT Assessment -  07/10/19 0001      Assessment   Medical Diagnosis  Cervicalgia M54.2, Other symptoms and signs involving the musculoskeletal system R29.898    Referring Provider (PT)  Merlene Laughter, MD                    [facility] Adult PT Treatment/Exercise - 07/10/19 0001      Neck Exercises: Seated   Neck Retraction  10 reps;5 secs      Knee/Hip Exercises: Aerobic   Nustep  L5 x 8 min UE/LE      Knee/Hip Exercises: Standing   Hip Flexion  Right;2 sets;10 reps   4#   Hip Abduction  2 sets;Knee straight   12 reps with 3#   Hip Extension  2 sets   12 reps 3#   Forward Step Up  Step Height: 6";10 reps;Both   in // with bil HHA     Knee/Hip Exercises: Seated   Long Arc Quad  2 sets;15 reps;Weights;Both;Strengthening    Long Arc Quad Weight  4 lbs.    Other Seated Knee/Hip Exercises  seated marching 2 x 20 with 4#          Balance Exercises - 07/10/19 1403      Balance Exercises: Standing   Standing Eyes Opened  Narrow base of support (BOS);30 secs;5 reps   cues for ankle strategy for balance   Standing Eyes Closed  2 reps;30 secs;Foam/compliant surface   with moderate postural sway         PT Short Term Goals - 07/03/19 1339      PT SHORT TERM GOAL #1   Title  pt to be I with inital HEP    Period  Weeks    Status  Achieved      PT SHORT TERM GOAL #2   Title  to asess BERG balance and make goals accordingly    Period  Weeks    Status  Achieved      PT SHORT TERM GOAL #3   Title  pt to report having no falls for >/= 1 week to demonstrate improving function/ stability    Baseline  continues to have falls reporting 5 over the weekend 07/03/2019    Period  Weeks    Status  On-going    Target Date  07/25/19      PT SHORT TERM GOAL #4  Title  pt to report decrease tension in the L upper trap and surrounding musculature to reduce pain to </= 4/10 and promote cervical ROM    Period  Weeks    Status  On-going    Target Date  07/25/19        PT Long Term Goals  - 07/03/19 1343      PT LONG TERM GOAL #1   Title  increase gross LE strength to >/= 4/5 to promote stability with walking / standing    Period  Weeks    Status  On-going      PT LONG TERM GOAL #2   Title  pt increase cervical L rotation/ sidebending by >/= 10 degrees with </= 2/10 pain for functional mobility for ADLs    Period  Weeks    Status  On-going      PT LONG TERM GOAL #3   Title  pt to be able to stand and walk >/= 30 min with LRAD for functional in home and community ambulation    Period  Weeks    Status  On-going      PT LONG TERM GOAL #4   Title  pt to report no falls or near falls for >/= 2 weeks to demonstrate improvement in function    Period  Weeks    Status  On-going      PT LONG TERM GOAL #5   Title  increase FOTO score to </=56% limited to demonstrated improvement in function    Period  Weeks    Status  On-going      Additional Long Term Goals   Additional Long Term Goals  Yes      PT LONG TERM GOAL #6   Title  improve berg balance score to >/= 45/56 to demo improvement in balance and safety with walking/ standing.    Baseline  19/ 56 initial berg    Period  Weeks    Status  On-going            Plan - 07/10/19 1430    Clinical Impression Statement  pt reports no falls since the last session. continued working hip Terressa Koyanagi strengthening with increased weight and reps. progress exercises to standing hwich she required frequent verbal cues for proper form. she did well with balance training which she required cues for ankle strategy for balance. no pain in the neck end of session but reported feeling fatigued.    PT Treatment/Interventions  ADLs/Self Care Home Management;Cryotherapy;Electrical Stimulation;Iontophoresis 4mg /ml Dexamethasone;Moist Heat;Ultrasound;Therapeutic activities;Therapeutic exercise;Balance training;Neuromuscular re-education;Manual techniques;Patient/family education;Passive range of motion;Dry needling;Taping;DME Instruction;Gait  training;Stair training    PT Next Visit Plan  update HEP PRN,  STW for the neck/ shoulder on the L, gross LE strengthening, proper use of DME did she use Walker at home?    PT Home Exercise Plan  ELPPTLWG - chin tuck, upper trap stretch, glute set, LAQ, clamshells, corne rhomberg balance       Patient will benefit from skilled therapeutic intervention in order to improve the following deficits and impairments:  Improper body mechanics, Increased muscle spasms, Pain, Decreased strength, Abnormal gait, Decreased activity tolerance, Decreased endurance, Postural dysfunction, Decreased balance, Decreased range of motion, Decreased knowledge of use of DME  Visit Diagnosis: Cervicalgia  Muscle weakness (generalized)  Other abnormalities of gait and mobility     Problem List Patient Active Problem List   Diagnosis Date Noted  . Onychomycosis 03/21/2019  . Skin fissure 03/21/2019  . Acute infection of nasal  sinus 03/15/2018  . Yeast cystitis 03/14/2018  . Decreased range of motion of finger of left hand 03/06/2017  . Anxiety 02/15/2017  . Dislocation, finger, interphalangeal joint 01/30/2017  . Pain in finger of left hand 01/27/2017  . Chronic kidney disease, stage III (moderate) 08/13/2015  . Primary open angle glaucoma of both eyes, severe stage 06/29/2015  . Bronchitis 04/03/2015  . Essential (primary) hypertension 04/03/2015  . Fibromyositis 04/03/2015  . Peptic esophagitis 04/03/2015  . Hearing loss of both ears 04/03/2015  . History of colonic polyps 04/03/2015  . Hypothyroidism 04/03/2015  . Incontinence of feces 04/03/2015  . Migraine without aura and without status migrainosus, not intractable 04/03/2015  . Pain in joint 04/03/2015  . Recurrent major depressive episodes, in full remission ([place]) 04/03/2015  . Disorder of rotator cuff 11/26/2014  . Other specified postprocedural states 11/26/2014  . Difficulty walking 06/19/2014  . Lipodystrophy 02/13/2014  . Abnormal  gait 10/15/2013  . At low risk for fall 10/15/2013  . Dyspareunia 07/25/2011  . Dysuria 07/25/2011  . Urge incontinence 07/25/2011  . Urinary urgency 07/25/2011  . Overactive bladder   . Migraine   . Fibromyalgia   . Chronic fatigue syndrome   . Metatarsal fracture   . Diabetes mellitus   . Ankle fracture   . Osteoarthritis   . IBS (irritable bowel syndrome)   . Depression   . Chronic urethritis   . Degenerative disc disease, lumbar   . Hypercholesteremia   . Prolactin increased   . Major depression in partial remission ([place]) 01/04/2011  . Restless legs syndrome 01/04/2011    Starr Lake PT, DPT, LAT, ATC  07/10/19  2:33 PM      [facility] [address] [place], [place], [address] Phone: [phone]   Fax:  [phone]  Name: UNDINE NEALIS MRN: [id number] Date of Birth: Jul 17, 1945

## 2019-07-12 IMAGING — MG DIGITAL SCREENING BILATERAL MAMMOGRAM WITH TOMO AND CAD
6 of 10 series · 6 of 30 positions shown · non-contrast
Comparison: Previous exam(s).

CLINICAL DATA: Screening.

EXAM:
DIGITAL SCREENING BILATERAL MAMMOGRAM WITH TOMO AND CAD

[R CC synth-2D]
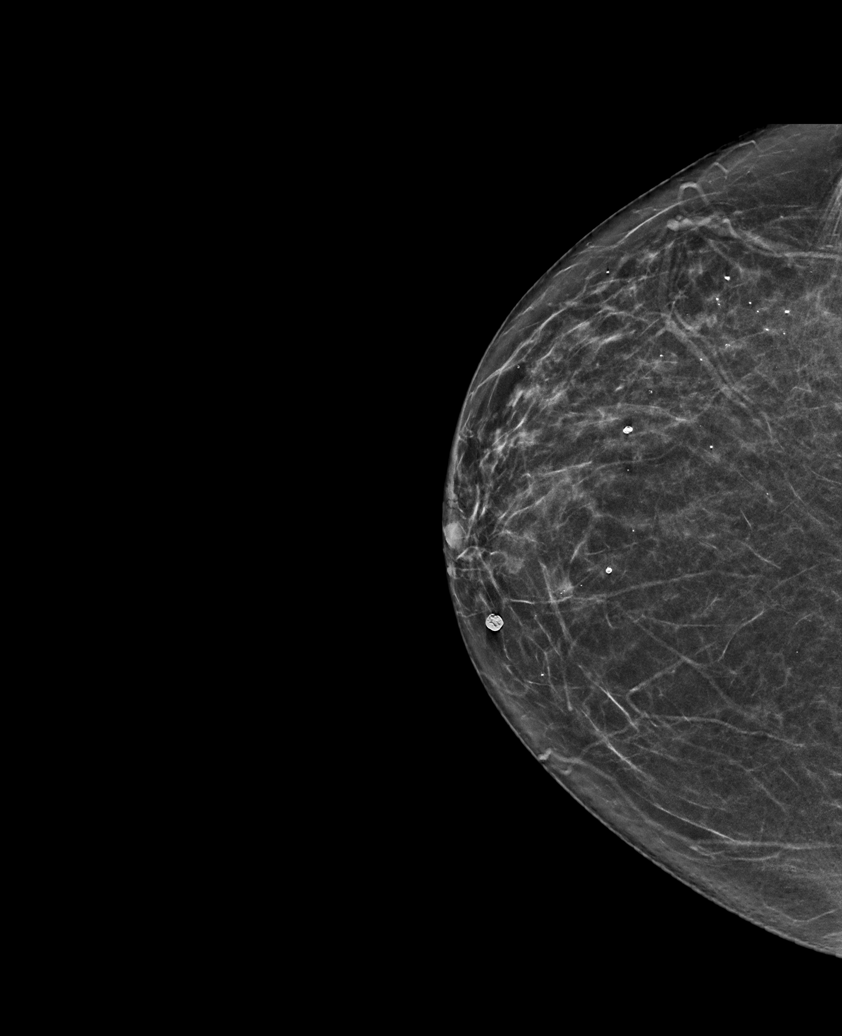

[R MLO synth-2D]
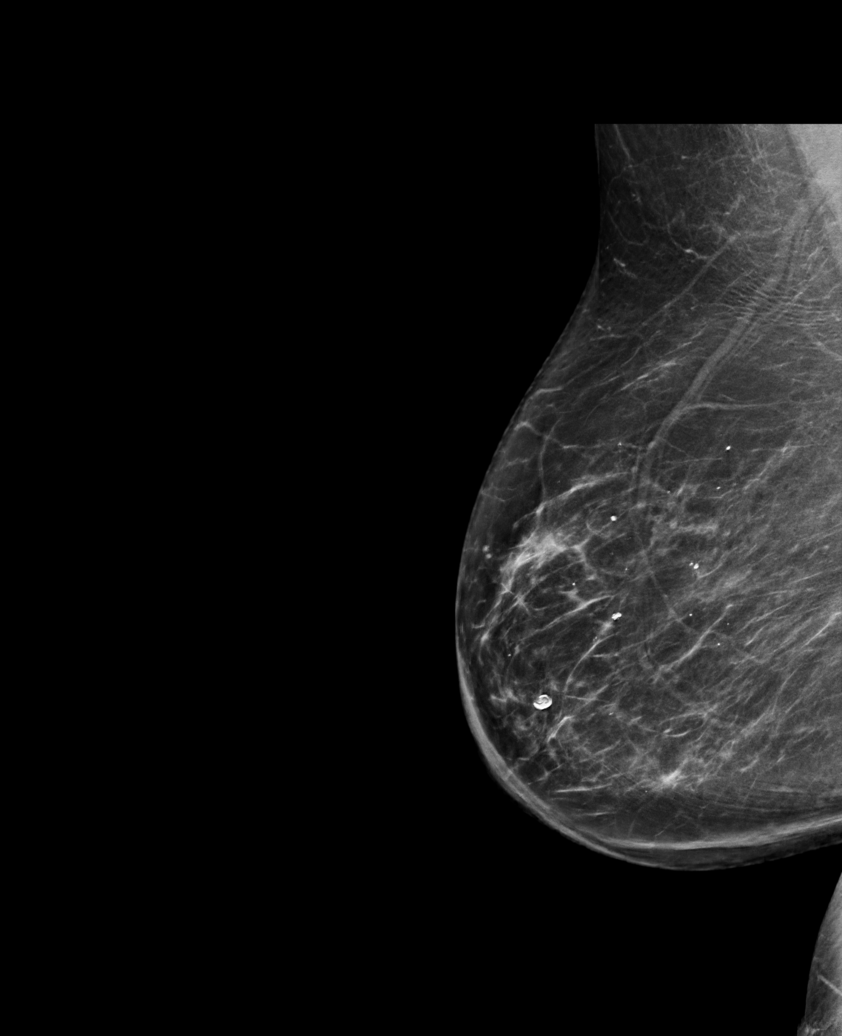

[L MLO synth-2D (1 of 2)]
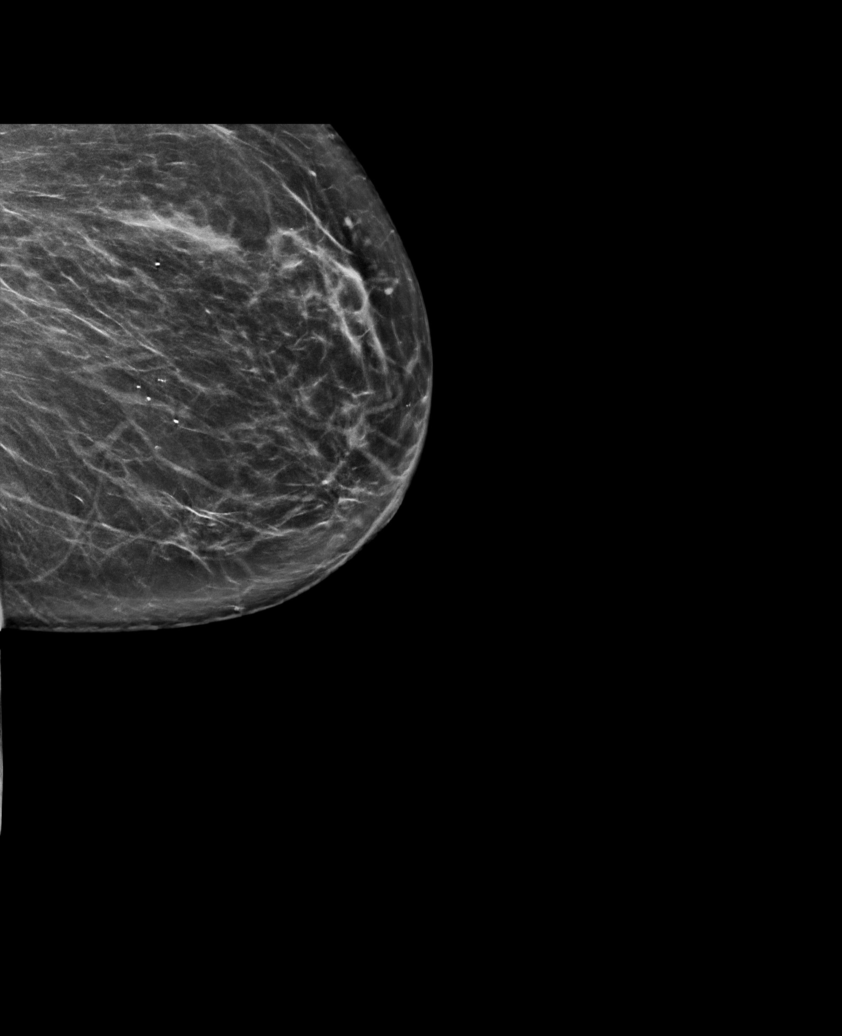

[L CC synth-2D]
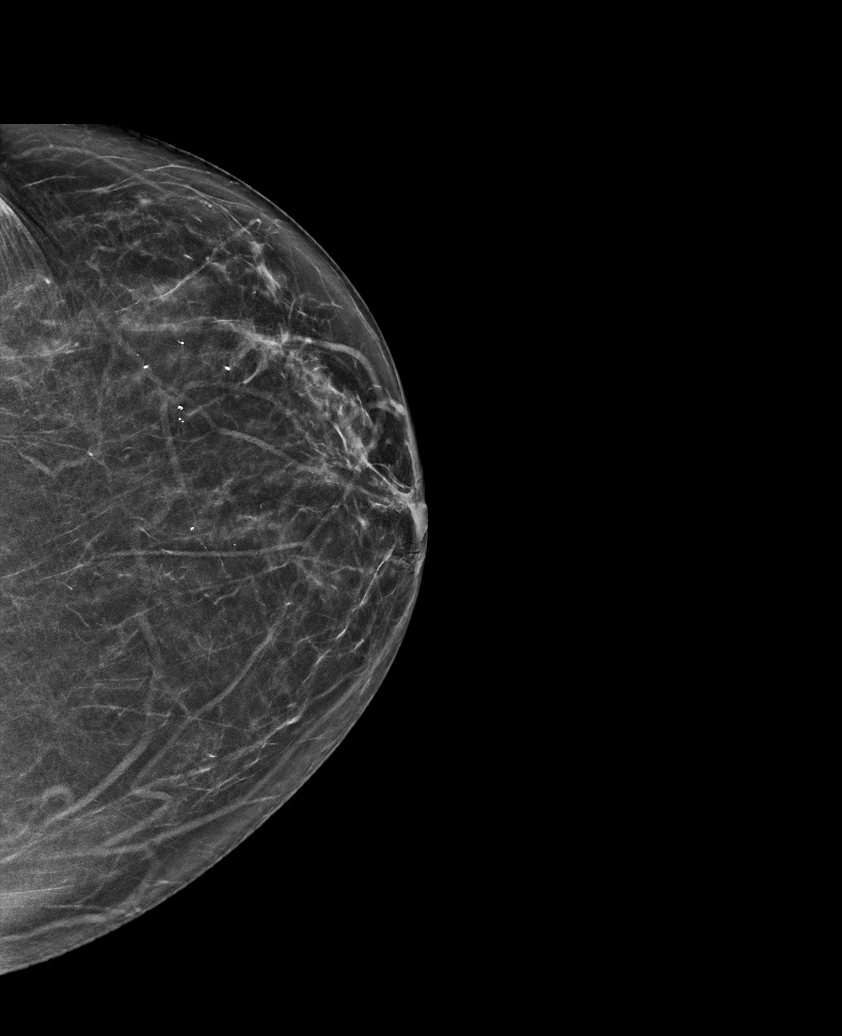

[L MLO synth-2D (2 of 2)]
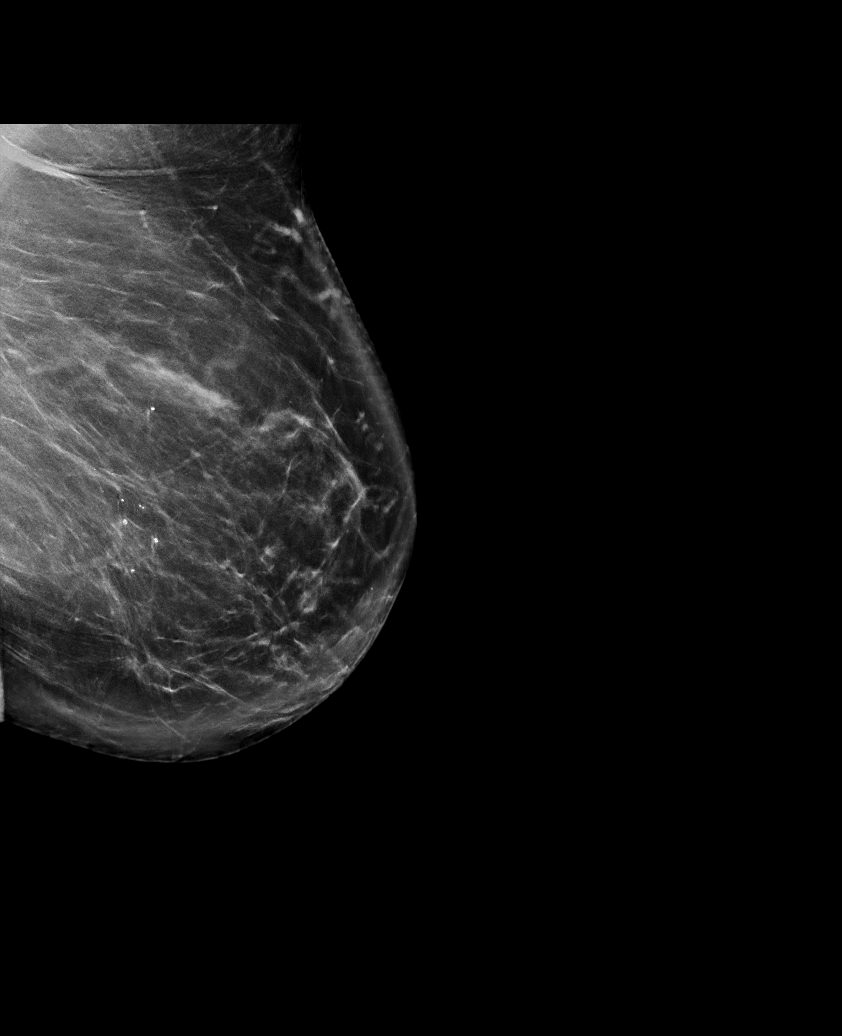

[L MLO tomo · tomo slice 40/79.0]
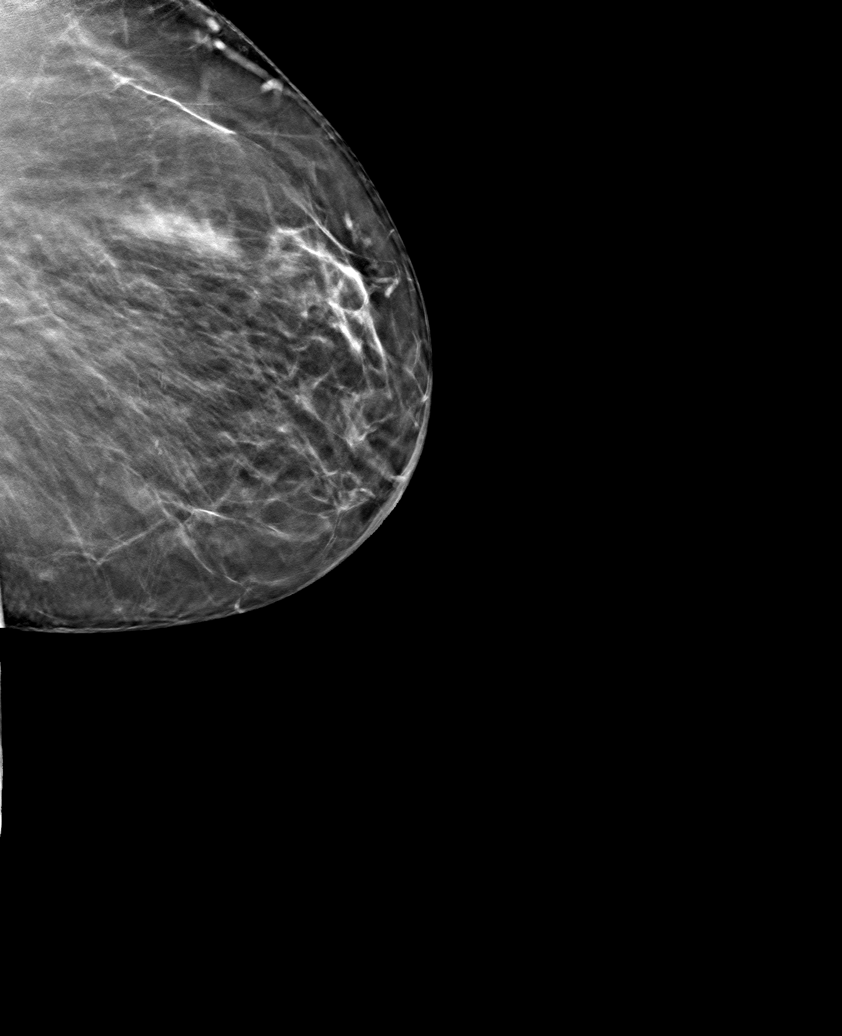

[6 of 30 positions shown; findings below may reference images not displayed]

ACR Breast Density Category c: The breast tissue is heterogeneously
dense, which may obscure small masses.
FINDINGS: There are no findings suspicious for malignancy. Images were
processed with CAD.
IMPRESSION: No mammographic evidence of malignancy. A result letter of this
screening mammogram will be mailed directly to the patient.

RECOMMENDATION:
Screening mammogram in one year. (Code:[id number]-U-LHB)

BI-RADS CATEGORY  1: Negative.

## 2019-07-16 ENCOUNTER — Encounter: Payer: Self-pay | Admitting: Physical Therapy

## 2019-07-16 ENCOUNTER — Other Ambulatory Visit: Payer: Self-pay

## 2019-07-16 ENCOUNTER — Ambulatory Visit: Payer: Medicare PPO | Attending: Geriatric Medicine | Admitting: Physical Therapy

## 2019-07-16 DIAGNOSIS — M542 Cervicalgia: Secondary | ICD-10-CM | POA: Insufficient documentation

## 2019-07-16 DIAGNOSIS — R2689 Other abnormalities of gait and mobility: Secondary | ICD-10-CM | POA: Insufficient documentation

## 2019-07-16 DIAGNOSIS — M6281 Muscle weakness (generalized): Secondary | ICD-10-CM | POA: Insufficient documentation

## 2019-07-16 DIAGNOSIS — G43719 Chronic migraine without aura, intractable, without status migrainosus: Secondary | ICD-10-CM | POA: Diagnosis not present

## 2019-07-16 DIAGNOSIS — R52 Pain, unspecified: Secondary | ICD-10-CM | POA: Diagnosis not present

## 2019-07-16 DIAGNOSIS — G8929 Other chronic pain: Secondary | ICD-10-CM | POA: Diagnosis not present

## 2019-07-16 DIAGNOSIS — G2581 Restless legs syndrome: Secondary | ICD-10-CM | POA: Diagnosis not present

## 2019-07-16 NOTE — Therapy (Signed)
[place], [place], [address] Phone: [phone]   Fax:  [phone]  Physical Therapy Treatment Progress Note Reporting Period 05/07/2019 to 07/16/2019  See note below for Objective Data and Assessment of Progress/Goals.       Patient Details  Name: Linda Lucas MRN: [id number] Date of Birth: 1945-12-15 Referring Provider (PT): Lajean Manes, MD    Encounter Date: 07/16/2019  PT End of Session - 07/16/19 1501    Visit Number  12    Number of Visits  17    Date for PT Re-Evaluation  08/14/19    Authorization Type  [employer] Medicare: KX mod at 15th visit    Progress Note Due on Visit  19    PT Start Time  1501    PT Stop Time  1543    PT Time Calculation (min)  42 min    Activity Tolerance  Patient tolerated treatment well    Behavior During Therapy  [facility] for tasks assessed/performed       Past Medical History:  Diagnosis Date  . Ankle fracture    bimeolar fracture  . Anxiety   . Chronic urethritis    frequent uti's and or urethritis  . Degenerative disc disease, lumbar 2007   bulging disc  . Depression   . Diabetes mellitus 2004  . Fibromyalgia   . Glaucoma   . Hypercholesteremia   . IBS (irritable bowel syndrome)   . 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 Migraine   . Osteoarthritis   . Overactive bladder   . Prolactin increased 05/2002   NORMAL MRI    Past Surgical History:  Procedure Laterality Date  . ABDOMINAL HYSTERECTOMY    . APPENDECTOMY  age 61  . BREAST BIOPSY     x 2  . BREAST EXCISIONAL BIOPSY Left 1994  . BREAST EXCISIONAL BIOPSY Left 1994  . BURCH PROCEDURE    . CARPECTOMY HAND     LEFT FOR KEINBACH'S DZ  . CATARACT EXTRACTION    . CHOLECYSTECTOMY OPEN     WITH CDE  . CYSTOSCOPY     X 4  .  FRACTURE METARSAL    . FUSION OF DISCS     C 3-4 4-5 5-6 VERTBRAES IN NECK DUE TO DEGEN. [place] DZ  . FUSION OF LEFT WRIST WITH PLATE AND SCREWS  4481   CADAVER BONE GRAFT AND APPLICATION OF HUMAN GROWTH FACTORS  . HEMORROIDECTOMY  age 74  . HYSTEROSCOPY     D&C,HYSTEROSCOPY FOR PED FIBROID   . INTERSTIM IMPLANT PLACEMENT    . OPEN REDUCTION INTERNAL FIXATION (ORIF) PROXIMAL PHALANX Left 10/30/2018   Procedure: OPEN REDUCTION WITH FIXATION LEFT MIDDLE FINGER;  Surgeon: Daryll Brod, MD;  Location: [facility];  Service: Orthopedics;  Laterality: Left;  . ORIF RADIAL HEAD / NECK FRACTURE  2010   RIGHT RADIAL HED EXTENDING INTO JOINT SPACE WITH PLATE AND SCREWS  . PERCUTANEOUS PINNING Left 01/31/2017   Procedure: PERCUTANEOUS PINNING EXTREMITYLEFT SMALL FINGER;  Surgeon: Daryll Brod, MD;  Location: [place];  Service: Orthopedics;  Laterality: Left;  . REPAIR EXTENSOR TENDON Left 10/30/2018   Procedure: EXTENSOR TENDON REPAIR;  Surgeon: Daryll Brod, MD;  Location: [facility];  Service: Orthopedics;  Laterality: Left;  . RIGHT ANKLE  FRACTURE    . ROTATOR CUFF REPAIR    . TONSILLECTOMY AND ADENOIDECTOMY    . TUBAL LIGATION    . WOUND EXPLORATION Left 10/30/2018   Procedure: EXPLORATION PROXIMAL INTERPHALANGEAL JOINT LEFT MIDDLE FINGER;  Surgeon: Cindee Salt, MD;  Location: [facility];  Service: Orthopedics;  Laterality: Left;    There were no vitals filed for this visit.  Subjective Assessment - 07/16/19 1507    Subjective  "I really haven't had a good week, my neighbors are giving me issues with playing music late. I did also have 4 falls since the last session, which I wasn't using my walker at home"    Diagnostic tests  03/12/2019 CT scan Impression: 1. No acute/traumatic cervical spine pathology.2. C4-C7 ACDF. The orthopedic hardware appears intact.    Patient Stated Goals  decrease pain in the neck, and to walk without the rollator  or AD.    Currently in Pain?  Yes    Pain Score  5     Pain Location  Neck    Pain Orientation  Right;Left    Pain Descriptors / Indicators  Aching    Pain Type  Chronic pain    Pain Onset  More than a month ago    Pain Frequency  Intermittent         [facility] PT Assessment - 07/16/19 0001      Assessment   Medical Diagnosis  Cervicalgia M54.2, Other symptoms and signs involving the musculoskeletal system R29.898    Referring Provider (PT)  Merlene Laughter, MD                    [facility] Adult PT Treatment/Exercise - 07/16/19 0001      Knee/Hip Exercises: Aerobic   Nustep  L5 x 8 min UE/LE      Knee/Hip Exercises: Standing   Hip Flexion  Both;2 sets;10 reps    Hip Abduction  2 sets;10 reps    Forward Step Up  Step Height: 6";10 reps;Both    Other Standing Knee Exercises  stepping over 1/2 foam roll 2 x 10       Knee/Hip Exercises: Seated   Sit to Sand  10 reps;without UE support          Balance Exercises - 07/16/19 1730      Balance Exercises: Standing   Rockerboard  Anterior/posterior   2 x 10  in // for ankle strategy       PT Education - 07/16/19 1733    Education Details  discussed with pt and husband about if she continued to refuse to use the walker at home and falls over the course of the next few visits which keeps producing pain in the neck and could potentiall cause other issues, which doesn't occur when she uses her RW. If she continues to fall then plan to refer back to her MD for further assessment.    Person(s) Educated  Patient    Methods  Explanation;Verbal cues;Handout    Comprehension  Verbalized understanding;Verbal cues required       PT Short Term Goals - 07/03/19 1339      PT SHORT TERM GOAL #1   Title  pt to be I with inital HEP    Period  Weeks    Status  Achieved      PT SHORT TERM GOAL #2   Title  to asess BERG balance and make goals accordingly    Period  Weeks  Status  Achieved      PT SHORT TERM GOAL #3   Title   pt to report having no falls for >/= 1 week to demonstrate improving function/ stability    Baseline  continues to have falls reporting 5 over the weekend 07/03/2019    Period  Weeks    Status  On-going    Target Date  07/25/19      PT SHORT TERM GOAL #4   Title  pt to report decrease tension in the L upper trap and surrounding musculature to reduce pain to </= 4/10 and promote cervical ROM    Period  Weeks    Status  On-going    Target Date  07/25/19        PT Long Term Goals - 07/03/19 1343      PT LONG TERM GOAL #1   Title  increase gross LE strength to >/= 4/5 to promote stability with walking / standing    Period  Weeks    Status  On-going      PT LONG TERM GOAL #2   Title  pt increase cervical L rotation/ sidebending by >/= 10 degrees with </= 2/10 pain for functional mobility for ADLs    Period  Weeks    Status  On-going      PT LONG TERM GOAL #3   Title  pt to be able to stand and walk >/= 30 min with LRAD for functional in home and community ambulation    Period  Weeks    Status  On-going      PT LONG TERM GOAL #4   Title  pt to report no falls or near falls for >/= 2 weeks to demonstrate improvement in function    Period  Weeks    Status  On-going      PT LONG TERM GOAL #5   Title  increase FOTO score to </=56% limited to demonstrated improvement in function    Period  Weeks    Status  On-going      Additional Long Term Goals   Additional Long Term Goals  Yes      PT LONG TERM GOAL #6   Title  improve berg balance score to >/= 45/56 to demo improvement in balance and safety with walking/ standing.    Baseline  19/ 56 initial berg    Period  Weeks    Status  On-going            Plan - 07/16/19 1735    Clinical Impression Statement  pt reports that she continues to fall reporting 4 since the last session with the last requiring EMS to help get her off the floor. She reports all her falls occur when she isn't using her RW or assistive device. discussed  if she doesn't continue to fall as a result to not using her AD then we willhave to refer back to her MD due to lack of functional progression, which pt and her husband agreed. continued working on hip/ knee strength an balance which she did well with all activity.    PT Treatment/Interventions  ADLs/Self Care Home Management;Cryotherapy;Electrical Stimulation;Iontophoresis 4mg /ml Dexamethasone;Moist Heat;Ultrasound;Therapeutic activities;Therapeutic exercise;Balance training;Neuromuscular re-education;Manual techniques;Patient/family education;Passive range of motion;Dry needling;Taping;DME Instruction;Gait training;Stair training    PT Next Visit Plan  update HEP PRN,  STW for the neck/ shoulder on the L, gross LE strengthening, proper use of DME did she use [facility] at home?    PT Home Exercise Plan  ELPPTLWG -  chin tuck, upper trap stretch, glute set, LAQ, clamshells, corne rhomberg balance    Consulted and Agree with Plan of Care  Patient       Patient will benefit from skilled therapeutic intervention in order to improve the following deficits and impairments:  Improper body mechanics, Increased muscle spasms, Pain, Decreased strength, Abnormal gait, Decreased activity tolerance, Decreased endurance, Postural dysfunction, Decreased balance, Decreased range of motion, Decreased knowledge of use of DME  Visit Diagnosis: Cervicalgia  Muscle weakness (generalized)  Other abnormalities of gait and mobility     Problem List Patient Active Problem List   Diagnosis Date Noted  . Onychomycosis 03/21/2019  . Skin fissure 03/21/2019  . Acute infection of nasal sinus 03/15/2018  . Yeast cystitis 03/14/2018  . Decreased range of motion of finger of left hand 03/06/2017  . Anxiety 02/15/2017  . Dislocation, finger, interphalangeal joint 01/30/2017  . Pain in finger of left hand 01/27/2017  . Chronic kidney disease, stage III (moderate) 08/13/2015  . Primary open angle glaucoma of both eyes,  severe stage 06/29/2015  . Bronchitis 04/03/2015  . Essential (primary) hypertension 04/03/2015  . Fibromyositis 04/03/2015  . Peptic esophagitis 04/03/2015  . Hearing loss of both ears 04/03/2015  . History of colonic polyps 04/03/2015  . Hypothyroidism 04/03/2015  . Incontinence of feces 04/03/2015  . Migraine without aura and without status migrainosus, not intractable 04/03/2015  . Pain in joint 04/03/2015  . Recurrent major depressive episodes, in full remission (HCC) 04/03/2015  . Disorder of rotator cuff 11/26/2014  . Other specified postprocedural states 11/26/2014  . Difficulty walking 06/19/2014  . Lipodystrophy 02/13/2014  . Abnormal gait 10/15/2013  . At low risk for fall 10/15/2013  . Dyspareunia 07/25/2011  . Dysuria 07/25/2011  . Urge incontinence 07/25/2011  . Urinary urgency 07/25/2011  . Overactive bladder   . Migraine   . Fibromyalgia   . Chronic fatigue syndrome   . Metatarsal fracture   . Diabetes mellitus   . Ankle fracture   . Osteoarthritis   . IBS (irritable bowel syndrome)   . Depression   . Chronic urethritis   . Degenerative disc disease, lumbar   . Hypercholesteremia   . Prolactin increased   . Major depression in partial remission (HCC) 01/04/2011  . Restless legs syndrome 01/04/2011   Lulu Riding PT, DPT, LAT, ATC  07/16/19  5:47 PM      [facility] [address] [place], [place], [address] Phone: [phone]   Fax:  [phone]  Name: ZAWADI APLIN MRN: [id number] Date of Birth: [birth year]

## 2019-07-17 DIAGNOSIS — Z79899 Other long term (current) drug therapy: Secondary | ICD-10-CM | POA: Diagnosis not present

## 2019-07-17 DIAGNOSIS — F419 Anxiety disorder, unspecified: Secondary | ICD-10-CM | POA: Diagnosis not present

## 2019-07-17 DIAGNOSIS — F333 Major depressive disorder, recurrent, severe with psychotic symptoms: Secondary | ICD-10-CM | POA: Diagnosis not present

## 2019-07-19 DIAGNOSIS — I129 Hypertensive chronic kidney disease with stage 1 through stage 4 chronic kidney disease, or unspecified chronic kidney disease: Secondary | ICD-10-CM | POA: Diagnosis not present

## 2019-07-22 DIAGNOSIS — H401122 Primary open-angle glaucoma, left eye, moderate stage: Secondary | ICD-10-CM | POA: Diagnosis not present

## 2019-07-22 DIAGNOSIS — H532 Diplopia: Secondary | ICD-10-CM | POA: Diagnosis not present

## 2019-07-22 DIAGNOSIS — Z961 Presence of intraocular lens: Secondary | ICD-10-CM | POA: Diagnosis not present

## 2019-07-22 DIAGNOSIS — H43813 Vitreous degeneration, bilateral: Secondary | ICD-10-CM | POA: Diagnosis not present

## 2019-07-22 DIAGNOSIS — H401113 Primary open-angle glaucoma, right eye, severe stage: Secondary | ICD-10-CM | POA: Diagnosis not present

## 2019-07-22 DIAGNOSIS — H0520 Unspecified exophthalmos: Secondary | ICD-10-CM | POA: Diagnosis not present

## 2019-07-22 DIAGNOSIS — H26491 Other secondary cataract, right eye: Secondary | ICD-10-CM | POA: Diagnosis not present

## 2019-07-22 DIAGNOSIS — E119 Type 2 diabetes mellitus without complications: Secondary | ICD-10-CM | POA: Diagnosis not present

## 2019-07-23 ENCOUNTER — Ambulatory Visit: Payer: Medicare PPO | Admitting: Physical Therapy

## 2019-07-23 ENCOUNTER — Other Ambulatory Visit: Payer: Self-pay

## 2019-07-23 DIAGNOSIS — M6281 Muscle weakness (generalized): Secondary | ICD-10-CM | POA: Diagnosis not present

## 2019-07-23 DIAGNOSIS — M542 Cervicalgia: Secondary | ICD-10-CM | POA: Diagnosis not present

## 2019-07-23 DIAGNOSIS — R2689 Other abnormalities of gait and mobility: Secondary | ICD-10-CM

## 2019-07-23 NOTE — Therapy (Signed)
[place], [place], [address] Phone: [phone]   Fax:  [phone]  Physical Therapy Treatment  Patient Details  Name: Linda Lucas MRN: [id number] Date of Birth: 05/21/1945 Referring Provider (PT): Lajean Manes, MD    Encounter Date: 07/23/2019  PT End of Session - 07/23/19 1415    Visit Number  13    Number of Visits  17    Date for PT Re-Evaluation  08/14/19    Authorization Type  [employer] Medicare: KX mod at 15th visit    PT Start Time  1410    PT Stop Time  1455    PT Time Calculation (min)  45 min    Equipment Utilized During Treatment  Gait belt    Activity Tolerance  Patient tolerated treatment well    Behavior During Therapy  [facility] for tasks assessed/performed       Past Medical History:  Diagnosis Date  . Ankle fracture    bimeolar fracture  . Anxiety   . Chronic urethritis    frequent uti's and or urethritis  . Degenerative disc disease, lumbar 2007   bulging disc  . Depression   . Diabetes mellitus 2004  . Fibromyalgia   . Glaucoma   . Hypercholesteremia   . IBS (irritable bowel syndrome)   . 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 Migraine   . Osteoarthritis   . Overactive bladder   . Prolactin increased 05/2002   NORMAL MRI    Past Surgical History:  Procedure Laterality Date  . ABDOMINAL HYSTERECTOMY    . APPENDECTOMY  age 73  . BREAST BIOPSY     x 2  . BREAST EXCISIONAL BIOPSY Left 1994  . BREAST EXCISIONAL BIOPSY Left 1994  . BURCH PROCEDURE    . CARPECTOMY HAND     LEFT FOR KEINBACH'S DZ  . CATARACT EXTRACTION    . CHOLECYSTECTOMY OPEN     WITH CDE  . CYSTOSCOPY     X 4  . FRACTURE METARSAL    . FUSION OF DISCS     C 3-4 4-5 5-6 VERTBRAES IN NECK DUE TO DEGEN. [place] DZ  . FUSION OF LEFT WRIST  WITH PLATE AND SCREWS  0973   CADAVER BONE GRAFT AND APPLICATION OF HUMAN GROWTH FACTORS  . HEMORROIDECTOMY  age 86  . HYSTEROSCOPY     D&C,HYSTEROSCOPY FOR PED FIBROID   . INTERSTIM IMPLANT PLACEMENT    . OPEN REDUCTION INTERNAL FIXATION (ORIF) PROXIMAL PHALANX Left 10/30/2018   Procedure: OPEN REDUCTION WITH FIXATION LEFT MIDDLE FINGER;  Surgeon: Daryll Brod, MD;  Location: [facility];  Service: Orthopedics;  Laterality: Left;  . ORIF RADIAL HEAD / NECK FRACTURE  2010   RIGHT RADIAL HED EXTENDING INTO JOINT SPACE WITH PLATE AND SCREWS  . PERCUTANEOUS PINNING Left 01/31/2017   Procedure: PERCUTANEOUS PINNING EXTREMITYLEFT SMALL FINGER;  Surgeon: Daryll Brod, MD;  Location: [facility];  Service: Orthopedics;  Laterality: Left;  . REPAIR EXTENSOR TENDON Left 10/30/2018   Procedure: EXTENSOR TENDON REPAIR;  Surgeon: Daryll Brod, MD;  Location: [facility];  Service: Orthopedics;  Laterality: Left;  . RIGHT ANKLE FRACTURE    . ROTATOR CUFF REPAIR    . TONSILLECTOMY AND ADENOIDECTOMY    . TUBAL LIGATION    .  WOUND EXPLORATION Left 10/30/2018   Procedure: EXPLORATION PROXIMAL INTERPHALANGEAL JOINT LEFT MIDDLE FINGER;  Surgeon: Cindee Salt, MD;  Location: [facility];  Service: Orthopedics;  Laterality: Left;    There were no vitals filed for this visit.  Subjective Assessment - 07/23/19 1415    Subjective  "I didn't have any falls since the last time I came in. My neck is doing much better today as well. I did have some issues with double vision so I went to the eye Dr. and he said I might have a brain tumor so I am planning to get some imaging done.         [facility] PT Assessment - 07/23/19 0001      Assessment   Medical Diagnosis  Cervicalgia M54.2, Other symptoms and signs involving the musculoskeletal system R29.898    Referring Provider (PT)  Merlene Laughter, MD                    [facility] Adult PT Treatment/Exercise -  07/23/19 0001      Knee/Hip Exercises: Aerobic   Nustep  L5 x 8 min UE/LE   working on endurance training     Knee/Hip Exercises: Standing   Hip Flexion  Stengthening;Both;2 sets;15 reps;Knee bent   3#   Hip Abduction  2 sets;10 reps;Knee straight    Hip Extension  2 sets;10 reps;Knee straight    Forward Step Up  Step Height: 6";10 reps;Both      Knee/Hip Exercises: Seated   Long Arc Quad  Both;1 set;20 reps;Strengthening    Long Arc Quad Weight  4 lbs.    Other Seated Knee/Hip Exercises  seated marching 2 x 20 with 4#          Balance Exercises - 07/23/19 1425      Balance Exercises: Standing   Standing Eyes Opened  Narrow base of support (BOS);3 reps   with 20 pertubations in multiple positions   Rockerboard  Anterior/posterior   ankle strategy in //   Other Standing Exercises  alternating toe taps on 6 inch step 2 x 15   with 3#         PT Short Term Goals - 07/03/19 1339      PT SHORT TERM GOAL #1   Title  pt to be I with inital HEP    Period  Weeks    Status  Achieved      PT SHORT TERM GOAL #2   Title  to asess BERG balance and make goals accordingly    Period  Weeks    Status  Achieved      PT SHORT TERM GOAL #3   Title  pt to report having no falls for >/= 1 week to demonstrate improving function/ stability    Baseline  continues to have falls reporting 5 over the weekend 07/03/2019    Period  Weeks    Status  On-going    Target Date  07/25/19      PT SHORT TERM GOAL #4   Title  pt to report decrease tension in the L upper trap and surrounding musculature to reduce pain to </= 4/10 and promote cervical ROM    Period  Weeks    Status  On-going    Target Date  07/25/19        PT Long Term Goals - 07/16/19 1745      PT LONG TERM GOAL #1   Title  increase gross LE strength  to >/= 4/5 to promote stability with walking / standing    Period  Weeks    Status  On-going      PT LONG TERM GOAL #2   Title  pt increase cervical L rotation/  sidebending by >/= 10 degrees with </= 2/10 pain for functional mobility for ADLs    Period  Weeks    Status  On-going      PT LONG TERM GOAL #3   Title  pt to be able to stand and walk >/= 30 min with LRAD for functional in home and community ambulation    Period  Weeks    Status  On-going      PT LONG TERM GOAL #4   Title  pt to report no falls or near falls for >/= 2 weeks to demonstrate improvement in function    Period  Weeks    Status  On-going      PT LONG TERM GOAL #5   Title  increase FOTO score to </=56% limited to demonstrated improvement in function    Period  Weeks    Status  On-going      PT LONG TERM GOAL #6   Title  improve berg balance score to >/= 45/56 to demo improvement in balance and safety with walking/ standing.    Period  Weeks    Status  On-going            Plan - 07/23/19 1455    Clinical Impression Statement  pt reported no falls since the last session and denies any pain in the neck. continued working on hip/ knee strengthening in seated and standing with 1 HHA. continued balacne training working on reaction with multi-angle pertubations.    PT Treatment/Interventions  ADLs/Self Care Home Management;Cryotherapy;Electrical Stimulation;Iontophoresis 4mg /ml Dexamethasone;Moist Heat;Ultrasound;Therapeutic activities;Therapeutic exercise;Balance training;Neuromuscular re-education;Manual techniques;Patient/family education;Passive range of motion;Dry needling;Taping;DME Instruction;Gait training;Stair training    PT Next Visit Plan  update HEP PRN,  STW for the neck/ shoulder on the L, gross LE strengthening, proper use of DME did she use [facility] at home?    PT Home Exercise Plan  ELPPTLWG - chin tuck, upper trap stretch, glute set, LAQ, clamshells, corne rhomberg balance    Consulted and Agree with Plan of Care  Patient       Patient will benefit from skilled therapeutic intervention in order to improve the following deficits and impairments:  Improper  body mechanics, Increased muscle spasms, Pain, Decreased strength, Abnormal gait, Decreased activity tolerance, Decreased endurance, Postural dysfunction, Decreased balance, Decreased range of motion, Decreased knowledge of use of DME  Visit Diagnosis: Cervicalgia  Muscle weakness (generalized)  Other abnormalities of gait and mobility     Problem List Patient Active Problem List   Diagnosis Date Noted  . Onychomycosis 03/21/2019  . Skin fissure 03/21/2019  . Acute infection of nasal sinus 03/15/2018  . Yeast cystitis 03/14/2018  . Decreased range of motion of finger of left hand 03/06/2017  . Anxiety 02/15/2017  . Dislocation, finger, interphalangeal joint 01/30/2017  . Pain in finger of left hand 01/27/2017  . Chronic kidney disease, stage III (moderate) 08/13/2015  . Primary open angle glaucoma of both eyes, severe stage 06/29/2015  . Bronchitis 04/03/2015  . Essential (primary) hypertension 04/03/2015  . Fibromyositis 04/03/2015  . Peptic esophagitis 04/03/2015  . Hearing loss of both ears 04/03/2015  . History of colonic polyps 04/03/2015  . Hypothyroidism 04/03/2015  . Incontinence of feces 04/03/2015  . Migraine without aura and without  status migrainosus, not intractable 04/03/2015  . Pain in joint 04/03/2015  . Recurrent major depressive episodes, in full remission (HCC) 04/03/2015  . Disorder of rotator cuff 11/26/2014  . Other specified postprocedural states 11/26/2014  . Difficulty walking 06/19/2014  . Lipodystrophy 02/13/2014  . Abnormal gait 10/15/2013  . At low risk for fall 10/15/2013  . Dyspareunia 07/25/2011  . Dysuria 07/25/2011  . Urge incontinence 07/25/2011  . Urinary urgency 07/25/2011  . Overactive bladder   . Migraine   . Fibromyalgia   . Chronic fatigue syndrome   . Metatarsal fracture   . Diabetes mellitus   . Ankle fracture   . Osteoarthritis   . IBS (irritable bowel syndrome)   . Depression   . Chronic urethritis   . Degenerative  disc disease, lumbar   . Hypercholesteremia   . Prolactin increased   . Major depression in partial remission (HCC) 01/04/2011  . Restless legs syndrome 01/04/2011    Lulu Riding PT, DPT, LAT, ATC  07/23/19  2:59 PM      [facility] [address] [place], [place], [address] Phone: [phone]   Fax:  [phone]  Name: Linda Lucas MRN: [id number] Date of Birth: 01/19/1946

## 2019-07-24 DIAGNOSIS — Z974 Presence of external hearing-aid: Secondary | ICD-10-CM | POA: Diagnosis not present

## 2019-07-24 DIAGNOSIS — H903 Sensorineural hearing loss, bilateral: Secondary | ICD-10-CM | POA: Diagnosis not present

## 2019-07-25 ENCOUNTER — Ambulatory Visit: Payer: Medicare PPO | Admitting: Physical Therapy

## 2019-07-25 ENCOUNTER — Other Ambulatory Visit: Payer: Self-pay

## 2019-07-25 ENCOUNTER — Encounter: Payer: Self-pay | Admitting: Physical Therapy

## 2019-07-25 DIAGNOSIS — R2689 Other abnormalities of gait and mobility: Secondary | ICD-10-CM

## 2019-07-25 DIAGNOSIS — M6281 Muscle weakness (generalized): Secondary | ICD-10-CM | POA: Diagnosis not present

## 2019-07-25 DIAGNOSIS — M542 Cervicalgia: Secondary | ICD-10-CM | POA: Diagnosis not present

## 2019-07-25 NOTE — Therapy (Signed)
[place], [place], [address] Phone: [phone]   Fax:  [phone]  Physical Therapy Treatment /  Discharge   Patient Details  Name: Linda Lucas MRN: [id number] Date of Birth: May 06, 1945 Referring Provider (PT): Lajean Manes, MD    Encounter Date: 07/25/2019  PT End of Session - 07/25/19 1422    Visit Number  14    Number of Visits  17    Date for PT Re-Evaluation  08/14/19    Authorization Type  [employer] Medicare: KX mod at 15th visit    Progress Note Due on Visit  19    PT Start Time  1421   pt arrived 6 min late   PT Stop Time  1459    PT Time Calculation (min)  38 min    Activity Tolerance  Patient tolerated treatment well    Behavior During Therapy  [facility] for tasks assessed/performed       Past Medical History:  Diagnosis Date  . Ankle fracture    bimeolar fracture  . Anxiety   . Chronic urethritis    frequent uti's and or urethritis  . Degenerative disc disease, lumbar 2007   bulging disc  . Depression   . Diabetes mellitus 2004  . Fibromyalgia   . Glaucoma   . Hypercholesteremia   . IBS (irritable bowel syndrome)   . 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 Migraine   . Osteoarthritis   . Overactive bladder   . Prolactin increased 05/2002   NORMAL MRI    Past Surgical History:  Procedure Laterality Date  . ABDOMINAL HYSTERECTOMY    . APPENDECTOMY  age 75  . BREAST BIOPSY     x 2  . BREAST EXCISIONAL BIOPSY Left 1994  . BREAST EXCISIONAL BIOPSY Left 1994  . BURCH PROCEDURE    . CARPECTOMY HAND     LEFT FOR KEINBACH'S DZ  . CATARACT EXTRACTION    . CHOLECYSTECTOMY OPEN     WITH CDE  . CYSTOSCOPY     X 4  . FRACTURE METARSAL    . FUSION OF DISCS     C 3-4 4-5 5-6 VERTBRAES IN NECK DUE TO DEGEN. [place] DZ  .  FUSION OF LEFT WRIST WITH PLATE AND SCREWS  1601   CADAVER BONE GRAFT AND APPLICATION OF HUMAN GROWTH FACTORS  . HEMORROIDECTOMY  age 78  . HYSTEROSCOPY     D&C,HYSTEROSCOPY FOR PED FIBROID   . INTERSTIM IMPLANT PLACEMENT    . OPEN REDUCTION INTERNAL FIXATION (ORIF) PROXIMAL PHALANX Left 10/30/2018   Procedure: OPEN REDUCTION WITH FIXATION LEFT MIDDLE FINGER;  Surgeon: Daryll Brod, MD;  Location: [place];  Service: Orthopedics;  Laterality: Left;  . ORIF RADIAL HEAD / NECK FRACTURE  2010   RIGHT RADIAL HED EXTENDING INTO JOINT SPACE WITH PLATE AND SCREWS  . PERCUTANEOUS PINNING Left 01/31/2017   Procedure: PERCUTANEOUS PINNING EXTREMITYLEFT SMALL FINGER;  Surgeon: Daryll Brod, MD;  Location: [place];  Service: Orthopedics;  Laterality: Left;  . REPAIR EXTENSOR TENDON Left 10/30/2018   Procedure: EXTENSOR TENDON REPAIR;  Surgeon: Daryll Brod, MD;  Location: [place];  Service: Orthopedics;  Laterality: Left;  . RIGHT ANKLE FRACTURE    . ROTATOR CUFF REPAIR    . TONSILLECTOMY  AND ADENOIDECTOMY    . TUBAL LIGATION    . WOUND EXPLORATION Left 10/30/2018   Procedure: EXPLORATION PROXIMAL INTERPHALANGEAL JOINT LEFT MIDDLE FINGER;  Surgeon: Daryll Brod, MD;  Location: [place];  Service: Orthopedics;  Laterality: Left;    There were no vitals filed for this visit.  Subjective Assessment - 07/25/19 1427    Subjective  " I saw my ENT and they think I have a brain tumor too. I have to get some imaging and I think i need to focus on that to make sure this is better before resuming physical therapy"         [facility] PT Assessment - 07/25/19 0001      Assessment   Medical Diagnosis  Cervicalgia M54.2, Other symptoms and signs involving the musculoskeletal system R29.898    Referring Provider (PT)  Lajean Manes, MD       Observation/Other Assessments   Focus on Therapeutic Outcomes (FOTO)   56% limited      AROM   Cervical  Flexion  48    Cervical Extension  30    Cervical - Right Side Bend  40   L side tightness at end range   Cervical - Left Side Bend  35    Cervical - Right Rotation  56    Cervical - Left Rotation  58      Strength   Right Hip Flexion  4/5    Right Hip ABduction  4-/5    Left Hip Extension  4/5    Left Hip ABduction  3+/5    Right Knee Flexion  4+/5    Right Knee Extension  4+/5    Left Knee Flexion  4+/5      Berg Balance Test   Sit to Stand  Able to stand  independently using hands    Standing Unsupported  Able to stand safely 2 minutes    Sitting with Back Unsupported but Feet Supported on Floor or Stool  Able to sit safely and securely 2 minutes    Stand to Sit  Controls descent by using hands    Transfers  Able to transfer safely, definite need of hands    Standing Unsupported with Eyes Closed  Able to stand 10 seconds safely    Standing Unsupported with Feet Together  Able to place feet together independently and stand 1 minute safely    From Standing, Reach Forward with Outstretched Arm  Can reach forward >12 cm safely (5")    From Standing Position, Pick up Object from Floor  Unable to pick up shoe, but reaches 2-5 cm (1-2") from shoe and balances independently    From Standing Position, Turn to Look Behind Over each Shoulder  Turn sideways only but maintains balance    Turn 360 Degrees  Able to turn 360 degrees safely one side only in 4 seconds or less    Standing Unsupported, Alternately Place Feet on Step/Stool  Able to complete 4 steps without aid or supervision    Standing Unsupported, One Foot in [employer] balance while stepping or standing    Standing on One Leg  Unable to try or needs assist to prevent fall    Total Score  37                            PT Education - 07/25/19 1500    Education Details  Reviewed HEP and importance of  maintaining consistency with her exercises to progress her current level of funciton. reviewed that she needs  to keep her RW / rollator with her at all times and shoulder be within reach anywhere she is. reviewed progress compared to inital assessment.    Person(s) Educated  Patient;Spouse    Methods  Explanation;Verbal cues;Handout    Comprehension  Verbalized understanding;Verbal cues required       PT Short Term Goals - 07/25/19 1430      PT SHORT TERM GOAL #1   Title  pt to be I with inital HEP    Period  Weeks    Status  Achieved      PT SHORT TERM GOAL #2   Title  to asess BERG balance and make goals accordingly    Period  Weeks    Status  Achieved      PT SHORT TERM GOAL #3   Title  pt to report having no falls for >/= 1 week to demonstrate improving function/ stability    Period  Weeks    Status  Achieved      PT SHORT TERM GOAL #4   Title  pt to report decrease tension in the L upper trap and surrounding musculature to reduce pain to </= 4/10 and promote cervical ROM    Period  Weeks    Status  Achieved        PT Long Term Goals - 07/25/19 1430      PT LONG TERM GOAL #1   Title  increase gross LE strength to >/= 4/5 to promote stability with walking / standing    Period  Weeks    Status  Partially Met      PT LONG TERM GOAL #2   Title  pt increase cervical L rotation/ sidebending by >/= 10 degrees with </= 2/10 pain for functional mobility for ADLs    Period  Weeks    Status  Achieved      PT LONG TERM GOAL #3   Title  pt to be able to stand and walk >/= 30 min with LRAD for functional in home and community ambulation    Period  Weeks    Status  Achieved      PT LONG TERM GOAL #4   Title  pt to report no falls or near falls for >/= 2 weeks to demonstrate improvement in function    Period  Weeks    Status  Achieved      PT LONG TERM GOAL #5   Title  increase FOTO score to </=56% limited to demonstrated improvement in function    Period  Weeks    Status  Achieved      PT LONG TERM GOAL #6   Title  improve berg balance score to >/= 45/56 to demo improvement  in balance and safety with walking/ standing.    Baseline  intial 19/56, today 37/56    Period  Weeks    Status  Not Met            Plan - 07/25/19 1456    Clinical Impression Statement  Linda Lucas has made some progres with physical therapy increasing her BERG score, increased her LE strength and additionally reports pain as intermittent in her Neck, and reports being free from falls for about 1-2 weeks. She does continue to exhibit weakness in the hip abductors/ extensors and does continue to score at a 37/56 on the BERG indicating a increased level of falls.  She has met or partially met all goals except for LTG # 6. she reports due to other medical issues that require increased attention from other providers and based onher progress with PT she would like to discharge today from PT toda.    PT Next Visit Plan  D/C    PT Home Exercise Plan  ELPPTLWG - chin tuck, upper trap stretch, glute set, LAQ, clamshells, corner rhomberg balance    Consulted and Agree with Plan of Care  Patient       Patient will benefit from skilled therapeutic intervention in order to improve the following deficits and impairments:  Improper body mechanics, Increased muscle spasms, Pain, Decreased strength, Abnormal gait, Decreased activity tolerance, Decreased endurance, Postural dysfunction, Decreased balance, Decreased range of motion, Decreased knowledge of use of DME  Visit Diagnosis: Cervicalgia  Muscle weakness (generalized)  Other abnormalities of gait and mobility     Problem List Patient Active Problem List   Diagnosis Date Noted  . Onychomycosis 03/21/2019  . Skin fissure 03/21/2019  . Acute infection of nasal sinus 03/15/2018  . Yeast cystitis 03/14/2018  . Decreased range of motion of finger of left hand 03/06/2017  . Anxiety 02/15/2017  . Dislocation, finger, interphalangeal joint 01/30/2017  . Pain in finger of left hand 01/27/2017  . Chronic kidney disease, stage III (moderate)  08/13/2015  . Primary open angle glaucoma of both eyes, severe stage 06/29/2015  . Bronchitis 04/03/2015  . Essential (primary) hypertension 04/03/2015  . Fibromyositis 04/03/2015  . Peptic esophagitis 04/03/2015  . Hearing loss of both ears 04/03/2015  . History of colonic polyps 04/03/2015  . Hypothyroidism 04/03/2015  . Incontinence of feces 04/03/2015  . Migraine without aura and without status migrainosus, not intractable 04/03/2015  . Pain in joint 04/03/2015  . Recurrent major depressive episodes, in full remission ([place]) 04/03/2015  . Disorder of rotator cuff 11/26/2014  . Other specified postprocedural states 11/26/2014  . Difficulty walking 06/19/2014  . Lipodystrophy 02/13/2014  . Abnormal gait 10/15/2013  . At low risk for fall 10/15/2013  . Dyspareunia 07/25/2011  . Dysuria 07/25/2011  . Urge incontinence 07/25/2011  . Urinary urgency 07/25/2011  . Overactive bladder   . Migraine   . Fibromyalgia   . Chronic fatigue syndrome   . Metatarsal fracture   . Diabetes mellitus   . Ankle fracture   . Osteoarthritis   . IBS (irritable bowel syndrome)   . Depression   . Chronic urethritis   . Degenerative disc disease, lumbar   . Hypercholesteremia   . Prolactin increased   . Major depression in partial remission ([place]) 01/04/2011  . Restless legs syndrome 01/04/2011    Starr Lake 07/25/2019, 3:02 PM  [facility] [address] [place], [place], [address] Phone: [phone]   Fax:  [phone]  Name: Linda Lucas MRN: [id number] Date of Birth: Apr 22, 1945       PHYSICAL THERAPY DISCHARGE SUMMARY  Visits from Start of Care: 14  Current functional level related to goals / functional outcomes: See goals, FOTO 56% limited   Remaining deficits: See assessment above   Education / Equipment: HEP, theraband, balance training, use of DME  Plan: Patient agrees to discharge.  Patient goals were  partially met. Patient is being discharged due to a change in medical status.  ????? and being pleased with her function         Starr Lake PT, DPT, LAT, ATC  07/25/19  3:03 PM

## 2019-07-30 DIAGNOSIS — H518 Other specified disorders of binocular movement: Secondary | ICD-10-CM | POA: Diagnosis not present

## 2019-07-30 DIAGNOSIS — H532 Diplopia: Secondary | ICD-10-CM | POA: Diagnosis not present

## 2019-07-30 DIAGNOSIS — R519 Headache, unspecified: Secondary | ICD-10-CM | POA: Diagnosis not present

## 2019-08-01 DIAGNOSIS — H401113 Primary open-angle glaucoma, right eye, severe stage: Secondary | ICD-10-CM | POA: Diagnosis not present

## 2019-08-14 DIAGNOSIS — F333 Major depressive disorder, recurrent, severe with psychotic symptoms: Secondary | ICD-10-CM | POA: Diagnosis not present

## 2019-08-14 DIAGNOSIS — F419 Anxiety disorder, unspecified: Secondary | ICD-10-CM | POA: Diagnosis not present

## 2019-08-19 DIAGNOSIS — G4701 Insomnia due to medical condition: Secondary | ICD-10-CM | POA: Diagnosis not present

## 2019-08-19 DIAGNOSIS — R52 Pain, unspecified: Secondary | ICD-10-CM | POA: Diagnosis not present

## 2019-08-19 DIAGNOSIS — G43719 Chronic migraine without aura, intractable, without status migrainosus: Secondary | ICD-10-CM | POA: Diagnosis not present

## 2019-08-19 DIAGNOSIS — G8929 Other chronic pain: Secondary | ICD-10-CM | POA: Diagnosis not present

## 2019-08-23 ENCOUNTER — Ambulatory Visit: Payer: Medicare PPO

## 2019-08-28 ENCOUNTER — Ambulatory Visit
Admission: RE | Admit: 2019-08-28 | Discharge: 2019-08-28 | Disposition: A | Discharge status: Home / Self Care | Payer: Medicare PPO | Source: Ambulatory Visit | Attending: Geriatric Medicine | Admitting: Geriatric Medicine

## 2019-08-28 ENCOUNTER — Other Ambulatory Visit: Payer: Self-pay

## 2019-08-28 DIAGNOSIS — Z1231 Encounter for screening mammogram for malignant neoplasm of breast: Secondary | ICD-10-CM | POA: Diagnosis not present

## 2019-09-05 ENCOUNTER — Encounter (HOSPITAL_COMMUNITY): Payer: Self-pay | Admitting: Emergency Medicine

## 2019-09-05 ENCOUNTER — Emergency Department (HOSPITAL_COMMUNITY): Payer: Medicare PPO

## 2019-09-05 ENCOUNTER — Observation Stay (HOSPITAL_COMMUNITY)
Admission: EM | Admit: 2019-09-05 | Discharge: 2019-09-07 | Disposition: A | Discharge status: Home / Self Care | Payer: Medicare PPO | Attending: Internal Medicine | Admitting: Internal Medicine

## 2019-09-05 ENCOUNTER — Other Ambulatory Visit: Payer: Self-pay

## 2019-09-05 DIAGNOSIS — R918 Other nonspecific abnormal finding of lung field: Secondary | ICD-10-CM | POA: Insufficient documentation

## 2019-09-05 DIAGNOSIS — I129 Hypertensive chronic kidney disease with stage 1 through stage 4 chronic kidney disease, or unspecified chronic kidney disease: Secondary | ICD-10-CM | POA: Diagnosis not present

## 2019-09-05 DIAGNOSIS — F419 Anxiety disorder, unspecified: Secondary | ICD-10-CM | POA: Insufficient documentation

## 2019-09-05 DIAGNOSIS — F329 Major depressive disorder, single episode, unspecified: Secondary | ICD-10-CM | POA: Diagnosis not present

## 2019-09-05 DIAGNOSIS — Z79891 Long term (current) use of opiate analgesic: Secondary | ICD-10-CM | POA: Insufficient documentation

## 2019-09-05 DIAGNOSIS — E119 Type 2 diabetes mellitus without complications: Secondary | ICD-10-CM

## 2019-09-05 DIAGNOSIS — H409 Unspecified glaucoma: Secondary | ICD-10-CM | POA: Insufficient documentation

## 2019-09-05 DIAGNOSIS — M797 Fibromyalgia: Secondary | ICD-10-CM | POA: Insufficient documentation

## 2019-09-05 DIAGNOSIS — Z79899 Other long term (current) drug therapy: Secondary | ICD-10-CM | POA: Diagnosis not present

## 2019-09-05 DIAGNOSIS — X58XXXA Exposure to other specified factors, initial encounter: Secondary | ICD-10-CM | POA: Insufficient documentation

## 2019-09-05 DIAGNOSIS — E1122 Type 2 diabetes mellitus with diabetic chronic kidney disease: Secondary | ICD-10-CM | POA: Diagnosis not present

## 2019-09-05 DIAGNOSIS — T17808A Unspecified foreign body in other parts of respiratory tract causing other injury, initial encounter: Secondary | ICD-10-CM | POA: Diagnosis present

## 2019-09-05 DIAGNOSIS — T17890A Other foreign object in other parts of respiratory tract causing asphyxiation, initial encounter: Principal | ICD-10-CM | POA: Insufficient documentation

## 2019-09-05 DIAGNOSIS — E7800 Pure hypercholesterolemia, unspecified: Secondary | ICD-10-CM | POA: Insufficient documentation

## 2019-09-05 DIAGNOSIS — N3281 Overactive bladder: Secondary | ICD-10-CM | POA: Insufficient documentation

## 2019-09-05 DIAGNOSIS — Z20822 Contact with and (suspected) exposure to covid-19: Secondary | ICD-10-CM | POA: Diagnosis not present

## 2019-09-05 DIAGNOSIS — Z7982 Long term (current) use of aspirin: Secondary | ICD-10-CM | POA: Insufficient documentation

## 2019-09-05 DIAGNOSIS — Z79890 Hormone replacement therapy (postmenopausal): Secondary | ICD-10-CM | POA: Insufficient documentation

## 2019-09-05 DIAGNOSIS — T17908A Unspecified foreign body in respiratory tract, part unspecified causing other injury, initial encounter: Secondary | ICD-10-CM | POA: Diagnosis present

## 2019-09-05 DIAGNOSIS — R0602 Shortness of breath: Secondary | ICD-10-CM | POA: Diagnosis not present

## 2019-09-05 DIAGNOSIS — I10 Essential (primary) hypertension: Secondary | ICD-10-CM | POA: Diagnosis not present

## 2019-09-05 DIAGNOSIS — N183 Chronic kidney disease, stage 3 unspecified: Secondary | ICD-10-CM | POA: Insufficient documentation

## 2019-09-05 DIAGNOSIS — T17900A Unspecified foreign body in respiratory tract, part unspecified causing asphyxiation, initial encounter: Secondary | ICD-10-CM | POA: Diagnosis not present

## 2019-09-05 DIAGNOSIS — R05 Cough: Secondary | ICD-10-CM | POA: Diagnosis not present

## 2019-09-05 DIAGNOSIS — Z7984 Long term (current) use of oral hypoglycemic drugs: Secondary | ICD-10-CM | POA: Diagnosis not present

## 2019-09-05 NOTE — ED Triage Notes (Signed)
Patient took her evening meds about one hour ago, she has a pill stuck in her throat.  Patient is diaphoretic, coughing and can speak a few words in between coughing.  She states that it was a half of a bigger pill.  Patient is CAOx4.

## 2019-09-05 NOTE — ED Notes (Signed)
Blood drawn in triage

## 2019-09-05 NOTE — ED Provider Notes (Signed)
TIME SEEN: 11:40 PM  CHIEF COMPLAINT: Choked on a pill  HPI: Patient is a 74 year old female with history of CKD, hyperlipidemia, diabetes, fibromyalgia who presents to the emergency department after she reports choking on a large multivitamin at 10:30 PM tonight.  States she has been coughing, short of breath ever since.  States that she believes the pill is in her airway.  She is not having any trouble swallowing her saliva.  No history of asthma or COPD.  Had a similar episode several weeks ago but was able to cough the pill up on her own.  Is continuously coughing now.  Denies wearing oxygen chronically.  Patient is a retired Airline pilot.  ROS: Level five caveat secondary to acuity and discomfort  PAST MEDICAL HISTORY/PAST SURGICAL HISTORY:  Past Medical History:  Diagnosis Date  . Ankle fracture    bimeolar fracture  . Anxiety   . Chronic urethritis    frequent uti's and or urethritis  . Degenerative disc disease, lumbar 2007   bulging disc  . Depression   . Diabetes mellitus 2004  . Fibromyalgia   . Glaucoma   . Hypercholesteremia   . IBS (irritable bowel syndrome)   . 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 Migraine   . Osteoarthritis   . Overactive bladder   . Prolactin increased 05/2002   NORMAL MRI    MEDICATIONS:  Prior to Admission medications   Medication Sig Start Date End Date Taking? Authorizing Provider  aspirin 81 MG tablet Take 81 mg by mouth daily.     [provider]  atorvastatin (LIPITOR) 40 MG tablet Take 40 mg by mouth every evening.     [provider]  BELSOMRA 20 MG TABS Take 20 mg by mouth at bedtime.  08/03/18   [provider]  brimonidine-timolol (COMBIGAN) 0.2-0.5 % ophthalmic solution Place 1 drop into both eyes every 12 (twelve) hours.  02/13/13    [provider]  calcium citrate-vitamin D (CITRACAL+D) 315-200 MG-UNIT tablet Take 1 tablet by mouth daily.     [provider]  clonazePAM (KLONOPIN) 0.5 MG tablet Take 1 mg by mouth at bedtime.  05/17/17   [provider]  conjugated estrogens (PREMARIN) vaginal cream INSERT 1 APPLICATORFUL  VAGINALLY 2 TIMES A WEEK. Patient taking differently: Place 1 Applicatorful vaginally 2 (two) times a week.  05/29/18   Fontaine, Belinda Block, MD  diphenhydrAMINE (BENADRYL) 25 MG tablet Take 25 mg by mouth at bedtime.    [provider]  dorzolamide (TRUSOPT) 2 % ophthalmic solution Place 1 drop into both eyes 2 (two) times daily.  10/11/14   [provider]  gabapentin (NEURONTIN) 800 MG tablet Take 800 mg by mouth at bedtime.    [provider]  Glucosamine-Chondroit-MSM-C-Mn (FLEXI JOINT PO) Take 2 capsules by mouth daily with breakfast.    [provider]  HYDROcodone-acetaminophen (NORCO) 5-325 MG tablet Take 1 tablet by mouth every 6 (six) hours as needed. 10/30/18   Daryll Brod, MD  HYDROmorphone (DILAUDID) 4 MG tablet Take 4 mg by mouth 2 (two) times daily as needed (for breakthrough pain).  02/02/15   [provider]  HYDROmorphone HCl 16 MG T24A Take 16 mg by mouth daily.  01/23/15   [provider]  lansoprazole (PREVACID 24HR) 15 MG capsule Take  15 mg by mouth at bedtime as needed (for reflux).    [provider]  latanoprost (XALATAN) 0.005 % ophthalmic solution Place 1 drop into both eyes at bedtime.     [provider]  levothyroxine (SYNTHROID) 100 MCG tablet Take 100 mcg by mouth daily.      [provider]  liraglutide (VICTOZA) 18 MG/3ML SOPN Inject 1.8 mg into the skin every morning.     [provider]  loratadine (CLARITIN) 10 MG tablet Take 10 mg by mouth daily.    [provider]  Multiple Vitamin (MULTIVITAMIN) tablet Take 1 tablet by mouth at bedtime.     [provider]  nadolol (CORGARD) 20 MG tablet Take 20 mg by mouth at bedtime.     [provider]  NON FORMULARY [facility]  Antifungal (nail)-#1    [provider]  oxybutynin (DITROPAN-XL) 10 MG 24 hr tablet Take 10 mg by mouth 2 (two) times daily.     [provider]  Probiotic Product (PROBIOTIC ADVANCED) CAPS Take 1 capsule by mouth daily.    [provider]  promethazine (PHENERGAN) 25 MG tablet Take 25 mg by mouth every 6 (six) hours as needed for nausea or vomiting.     [provider]  repaglinide (PRANDIN) 2 MG tablet Take 4 mg by mouth See admin instructions. Take 4 mg by mouth in the morning before breakfast and 4 mg before evening meal 06/21/17   [provider]  rOPINIRole (REQUIP) 0.5 MG tablet Take 1.5 mg by mouth at bedtime.     [provider]  trimethoprim (TRIMPEX) 100 MG tablet Take 100 mg by mouth at bedtime.     [provider]  venlafaxine (EFFEXOR) 100 MG tablet Take 200 mg by mouth every morning.    [provider]  venlafaxine (EFFEXOR) 75 MG tablet Take 75 mg by mouth at bedtime.    [provider]    ALLERGIES:  Allergies  Allergen Reactions  . Empagliflozin Other (See Comments)    Pt stated, "This gave me a yeast infection"  . Adhesive [Tape] Other (See Comments)    Causes skin to peel and blister- ok with paper tape   . Oxycodone-Acetaminophen Nausea And Vomiting  . Oxymorphone Hcl Nausea And Vomiting  . Aspirin Swelling and Rash    "IN LARGE DOSES"- Body swells  . Ivp Dye [Iodinated Diagnostic Agents] Rash    SOCIAL HISTORY:  Social History   Tobacco Use  . Smoking status: Never Smoker  . Smokeless tobacco: Never Used  Substance Use Topics  . Alcohol use: No    Alcohol/week: 0.0 standard drinks    FAMILY HISTORY: Family History  Problem Relation Age of Onset  . Other Daughter        needle stick at hospital died of AIDS and hepatiitis  .  Diabetes Mother   . Hypertension Mother   . Heart disease Mother   . Heart attack Mother   . Diabetes Father   . Heart disease Father   . Hypertension Father   . Rheumatic fever Father   . Diabetes Sister   . Osteoarthritis Sister   . Heart attack Sister   . Diabetes Brother   . Heart attack Brother   . Cancer Maternal Grandmother        OVARIAN OR [place]  . Other Son        paralyzed after motorcycle accident    EXAM: BP (!) 210/150 (BP  Location: Left Leg)   Pulse 99   Temp (!) 96.9 F (36.1 C) (Axillary)   Resp 18   SpO2 96%  CONSTITUTIONAL: Alert and oriented and responds appropriately to questions.  Elderly, obese, appears very uncomfortable, diaphoretic HEAD: Normocephalic EYES: Conjunctivae clear, pupils appear equal, EOM appear intact ENT: normal nose; moist mucous membranes; no angioedema, normal phonation, no stridor, no trismus or drooling, handling secretions, dry cough NECK: Supple, normal ROM CARD: Regular and intermittently tachycardic; S1 and S2 appreciated; no murmurs, no clicks, no rubs, no gallops RESP: Normal chest excursion without splinting or tachypnea; breath sounds clear and equal bilaterally; minimal scattered wheezes at times, no rhonchi, no rales, appears to be in distress, no hypoxia on room air, speaking short sentences between coughing ABD/GI: Normal bowel sounds; non-distended; soft, non-tender, no rebound, no guarding, no peritoneal signs, no hepatosplenomegaly BACK:  The back appears normal EXT: Normal ROM in all joints; no deformity noted, no edema; no cyanosis SKIN: Normal color for age and race; warm; no rash on exposed skin NEURO: Moves all extremities equally PSYCH: The patient's mood and manner are appropriate.   MEDICAL DECISION MAKING: Patient here after she choked on a pill.  She is coughing, appears very uncomfortable and in distress and is extremely diaphoretic.  Attempted Heimlich maneuver x3 without any relief and patient declined any  further efforts.  Chest x-ray obtained and does not show any signs of pneumonia, volume overload, aspiration or radiopaque foreign body.  Labs, Covid swab pending.  Will discuss with pulmonology on call as she may need a bronch.  I do not think this is an esophageal foreign body.  She has no difficulty swallowing her secretions.  ED PROGRESS: 12:00 AM  Spoke with Dr. Warrick Parisian with critical care.  Ground team will see patient for evaluation in the ED.  Will give dose of IV Ativan to help with cough, anxiety.  12:50 AM  CCM/Pulm MD at bedside.  Minimal improvement with initial dose of IV Ativan.  Will give second dose of Ativan.  1:00 AM  Spoke with CCM physician.  Appreciate pulmonology help.  Recommend CT chest without contrast.  They will order DuoNeb to see if this helps relieve any of her symptoms.  Recommend hospital admission for observation.  If no improvement in the morning, patient may need bronchoscopy but would need full team in the morning and this does not need to be done emergently.  Patient updated with plan.  1:25 AM Discussed patient's case with hospitalist, Dr. Julian Reil.  I have recommended admission and patient (and family if present) agree with this plan. Admitting physician will place admission orders.   I reviewed all nursing notes, vitals, pertinent previous records and reviewed/interpreted all EKGs, lab and urine results, imaging (as available).    EKG Interpretation  Date/Time:  Thursday September 05 2019 23:55:54 EDT Ventricular Rate:  90 PR Interval:    QRS Duration: 95 QT Interval:  367 QTC Calculation: 449 R Axis:   38 Text Interpretation: Ventricular-paced complexes No further rhythm analysis attempted due to paced rhythm Confirmed by Rochele Raring [phone]) on 09/06/2019 12:00:55 AM       CRITICAL CARE Performed by: Baxter Hire Synethia Endicott   Total critical care time: 45 minutes  Critical care time was exclusive of separately billable procedures and treating other  patients.  Critical care was necessary to treat or prevent imminent or life-threatening deterioration.  Critical care was time spent personally by me on the following activities: development of treatment plan  with patient and/or surrogate as well as nursing, discussions with consultants, evaluation of patient's response to treatment, examination of patient, obtaining history from patient or surrogate, ordering and performing treatments and interventions, ordering and review of laboratory studies, ordering and review of radiographic studies, pulse oximetry and re-evaluation of patient's condition.   Linda Lucas was evaluated in Emergency Department on 09/05/2019 for the symptoms described in the history of present illness. She was evaluated in the context of the global COVID-19 pandemic, which necessitated consideration that the patient might be at risk for infection with the SARS-CoV-2 virus that causes COVID-19. Institutional protocols and algorithms that pertain to the evaluation of patients at risk for COVID-19 are in a state of rapid change based on information released by regulatory bodies including the CDC and federal and state organizations. These policies and algorithms were followed during the patient's care in the ED.      Takima Encina, Layla Maw, DO 09/06/19 [phone]

## 2019-09-05 NOTE — ED Notes (Signed)
Attempted Heimlich maneuver x3 with MD Ward and PA Sanders at bedside. Pt did not cough up the pill. Pt requested [place] to stop after the third thrust.

## 2019-09-06 ENCOUNTER — Emergency Department (HOSPITAL_COMMUNITY): Payer: Medicare PPO

## 2019-09-06 DIAGNOSIS — T17808A Unspecified foreign body in other parts of respiratory tract causing other injury, initial encounter: Secondary | ICD-10-CM | POA: Diagnosis not present

## 2019-09-06 DIAGNOSIS — T17908A Unspecified foreign body in respiratory tract, part unspecified causing other injury, initial encounter: Secondary | ICD-10-CM | POA: Diagnosis present

## 2019-09-06 DIAGNOSIS — I10 Essential (primary) hypertension: Secondary | ICD-10-CM

## 2019-09-06 DIAGNOSIS — E119 Type 2 diabetes mellitus without complications: Secondary | ICD-10-CM | POA: Diagnosis not present

## 2019-09-06 DIAGNOSIS — R918 Other nonspecific abnormal finding of lung field: Secondary | ICD-10-CM | POA: Diagnosis not present

## 2019-09-06 LAB — CBC WITH DIFFERENTIAL/PLATELET
Abs Immature Granulocytes: 0.11 10*3/uL — ABNORMAL HIGH (ref 0.00–0.07)
Basophils Absolute: 0.2 10*3/uL — ABNORMAL HIGH (ref 0.0–0.1)
Basophils Relative: 1 %
Eosinophils Absolute: 0.4 10*3/uL (ref 0.0–0.5)
Eosinophils Relative: 3 %
HCT: 39.9 % (ref 36.0–46.0)
Hemoglobin: 13.4 g/dL (ref 12.0–15.0)
Immature Granulocytes: 1 %
Lymphocytes Relative: 33 %
Lymphs Abs: 4.7 10*3/uL — ABNORMAL HIGH (ref 0.7–4.0)
MCH: 31.8 pg (ref 26.0–34.0)
MCHC: 33.6 g/dL (ref 30.0–36.0)
MCV: 94.8 fL (ref 80.0–100.0)
Monocytes Absolute: 1.4 10*3/uL — ABNORMAL HIGH (ref 0.1–1.0)
Monocytes Relative: 10 %
Neutro Abs: 7.5 10*3/uL (ref 1.7–7.7)
Neutrophils Relative %: 52 %
Platelets: 294 10*3/uL (ref 150–400)
RBC: 4.21 MIL/uL (ref 3.87–5.11)
RDW: 12.1 % (ref 11.5–15.5)
WBC: 14.2 10*3/uL — ABNORMAL HIGH (ref 4.0–10.5)
nRBC: 0 % (ref 0.0–0.2)

## 2019-09-06 LAB — BASIC METABOLIC PANEL
Anion gap: 12 (ref 5–15)
BUN: 17 mg/dL (ref 8–23)
CO2: 23 mmol/L (ref 22–32)
Calcium: 9.8 mg/dL (ref 8.9–10.3)
Chloride: 105 mmol/L (ref 98–111)
Creatinine, Ser: 1.5 mg/dL — ABNORMAL HIGH (ref 0.44–1.00)
GFR calc Af Amer: 39 mL/min — ABNORMAL LOW (ref >60)
GFR calc non Af Amer: 34 mL/min — ABNORMAL LOW (ref >60)
Glucose, Bld: 138 mg/dL — ABNORMAL HIGH (ref 70–99)
Potassium: 4.5 mmol/L (ref 3.5–5.1)
Sodium: 140 mmol/L (ref 135–145)

## 2019-09-06 LAB — GLUCOSE, CAPILLARY
Glucose-Capillary: 53 mg/dL — ABNORMAL LOW (ref 70–99)
Glucose-Capillary: 116 mg/dL — ABNORMAL HIGH (ref 70–99)
Glucose-Capillary: 79 mg/dL (ref 70–99)
Glucose-Capillary: 144 mg/dL — ABNORMAL HIGH (ref 70–99)
Glucose-Capillary: 238 mg/dL — ABNORMAL HIGH (ref 70–99)

## 2019-09-06 LAB — HEMOGLOBIN A1C
Hgb A1c MFr Bld: 7.1 % — ABNORMAL HIGH (ref 4.8–5.6)
Mean Plasma Glucose: 157.07 mg/dL

## 2019-09-06 LAB — SARS CORONAVIRUS 2 BY RT PCR (HOSPITAL ORDER, PERFORMED IN ~~LOC~~ HOSPITAL LAB): SARS Coronavirus 2: NEGATIVE

## 2019-09-06 MED ORDER — SODIUM CHLORIDE 0.9 % IV SOLN: INTRAVENOUS | Status: DC

## 2019-09-06 MED ORDER — TIMOLOL MALEATE 0.5 % OP SOLN
1.0000 [drp] | Freq: Two times a day (BID) | OPHTHALMIC | Status: DC
  Administered 2019-09-06 – 2019-09-07 (×3): 1 [drp] via OPHTHALMIC
  Filled 2019-09-06: qty 5

## 2019-09-06 MED ORDER — ONDANSETRON HCL 4 MG/2ML IJ SOLN: 4.0000 mg | Freq: Four times a day (QID) | INTRAMUSCULAR | Status: DC | PRN

## 2019-09-06 MED ORDER — DEXTROSE 50 % IV SOLN
25.0000 mL | Freq: Once | INTRAVENOUS | Status: AC
  Administered 2019-09-06: 25 mL via INTRAVENOUS

## 2019-09-06 MED ORDER — DORZOLAMIDE HCL 2 % OP SOLN
1.0000 [drp] | Freq: Two times a day (BID) | OPHTHALMIC | Status: DC
  Administered 2019-09-06 – 2019-09-07 (×3): 1 [drp] via OPHTHALMIC
  Filled 2019-09-06: qty 10

## 2019-09-06 MED ORDER — LORAZEPAM 2 MG/ML IJ SOLN
0.5000 mg | INTRAMUSCULAR | Status: DC | PRN
  Administered 2019-09-06 (×2): 1 mg via INTRAVENOUS
  Filled 2019-09-06 (×2): qty 1

## 2019-09-06 MED ORDER — INSULIN ASPART 100 UNIT/ML ~~LOC~~ SOLN
0.0000 [IU] | SUBCUTANEOUS | Status: DC
  Administered 2019-09-06: 3 [IU] via SUBCUTANEOUS
  Administered 2019-09-06 – 2019-09-07 (×2): 1 [IU] via SUBCUTANEOUS
  Administered 2019-09-07: 3 [IU] via SUBCUTANEOUS

## 2019-09-06 MED ORDER — LORAZEPAM 2 MG/ML IJ SOLN
0.5000 mg | Freq: Once | INTRAMUSCULAR | Status: AC
  Administered 2019-09-06: 0.5 mg via INTRAVENOUS
  Filled 2019-09-06: qty 1

## 2019-09-06 MED ORDER — DEXTROSE-NACL 5-0.9 % IV SOLN: INTRAVENOUS | Status: DC

## 2019-09-06 MED ORDER — IPRATROPIUM-ALBUTEROL 0.5-2.5 (3) MG/3ML IN SOLN
3.0000 mL | Freq: Once | RESPIRATORY_TRACT | Status: AC
  Administered 2019-09-06: 3 mL via RESPIRATORY_TRACT
  Filled 2019-09-06: qty 3

## 2019-09-06 MED ORDER — HYDROMORPHONE HCL 1 MG/ML IJ SOLN: 1.0000 mg | INTRAMUSCULAR | Status: DC | PRN

## 2019-09-06 MED ORDER — BRIMONIDINE TARTRATE 0.2 % OP SOLN
1.0000 [drp] | Freq: Two times a day (BID) | OPHTHALMIC | Status: DC
  Administered 2019-09-06 – 2019-09-07 (×3): 1 [drp] via OPHTHALMIC
  Filled 2019-09-06 (×2): qty 5

## 2019-09-06 MED ORDER — DEXTROSE 50 % IV SOLN
INTRAVENOUS | Status: AC
  Filled 2019-09-06: qty 50

## 2019-09-06 MED ORDER — BRIMONIDINE TARTRATE-TIMOLOL 0.2-0.5 % OP SOLN: 1.0000 [drp] | Freq: Two times a day (BID) | OPHTHALMIC | Status: DC

## 2019-09-06 MED ORDER — ONDANSETRON HCL 4 MG PO TABS: 4.0000 mg | ORAL_TABLET | Freq: Four times a day (QID) | ORAL | Status: DC | PRN

## 2019-09-06 MED ORDER — LATANOPROST 0.005 % OP SOLN
1.0000 [drp] | Freq: Every day | OPHTHALMIC | Status: DC
  Administered 2019-09-06: 1 [drp] via OPHTHALMIC
  Filled 2019-09-06: qty 2.5

## 2019-09-06 MED ORDER — LORAZEPAM 2 MG/ML IJ SOLN: 0.5000 mg | INTRAMUSCULAR | Status: DC | PRN

## 2019-09-06 NOTE — Progress Notes (Signed)
Responded to consult for PIV. On arrival, noted PIV obtained by RN. Consult cleared.

## 2019-09-06 NOTE — Care Management Obs Status (Signed)
MEDICARE OBSERVATION STATUS NOTIFICATION   Patient Details  Name: PRIM MORACE MRN: [id number] Date of Birth: 13-Feb-1946   Medicare Observation Status Notification Given:  Yes    Lockie Pares, RN 09/06/2019, 5:33 PM

## 2019-09-06 NOTE — Progress Notes (Signed)
SLP Cancellation Note  Patient Details Name: Linda Lucas MRN: [id number] DOB: [birth year]   Cancelled treatment:       Reason Eval/Treat Not Completed: Medical issues which prohibited therapy. Per chart review and discussion with RN, pt is NPO this morning pending possible procedure. Will f/u as able for swallow evaluation once she is able to resume POs.    Mahala Menghini., M.A. CCC-SLP Acute Rehabilitation Services Pager [phone] Office [phone]  09/06/2019, 8:35 AM

## 2019-09-06 NOTE — Consult Note (Addendum)
NAME:  Linda Lucas, MRN:  [id number], DOB:  05/28/1945, LOS: 0 ADMISSION DATE:  09/05/2019, CONSULTATION DATE:  09/06/2019 REFERRING MD:  ED physician, CHIEF COMPLAINT:  Aspiration   Brief History   74 year old woman with history of DM, CKD3 presenting after potential aspiration of a pill.  History of present illness   74 year old woman with history of DM, CKD3 presenting after potential aspiration of a pill around 10:30pm tonight. She reports her husband had split her multivitamin tablet in half for her. She has had cough productive of yellow mucous. Denies hemoptysis. She felt like it was stuck in the center of her chest. She has been having some dyspnea. This happened recently where she was able to cough up the pill.   In the ED, heimlich maneuver was attempted. O2 sats have been 96-98% on room air. 1 view CXR with no pneumomediastinum or radiopaque foreign body. No focal abnormalities in lung parenchyma.  Past Medical History  DM, CKD3, anxiety  Significant Hospital Events   09/06/2019> Admit  Consults:  PCCM 6/25  Procedures:  None  Significant Diagnostic Tests:  CXR 09/05/2019: Lungs symmetrically inflated. No evidence of pneumomediastinum or radiopaque foreign body. No focal airspace disease or pleural effusion.  Micro Data:  COVID19 6/24> neg  Antimicrobials:  None  Interim history/subjective:  Reports she still feels need to cough  Objective   Blood pressure (!) 123/104, pulse 88, temperature (!) 96.9 F (36.1 C), temperature source Axillary, resp. rate 18, SpO2 97 %.       No intake or output data in the 24 hours ending 09/06/19 0104 There were no vitals filed for this visit.  Examination: General: Coughing frequently HENT: [place]/AT, moist mucous membranes, oropharynx clear Lungs: Focal wheeze in right mid lower lung field, otherwise clear to auscultation, no stridor, no rales Cardiovascular: RRR Abdomen: Soft, nondistended, nontender Extremities: No LE  edema Neuro: Awake, alert, able to answer questions appropriately, moving all four extremities  Assessment & Plan:  74 year old woman with history of DM, CKD3 presenting after potential aspiration of a pill.  Possible foreign body aspiration: O2 sat 96-98% on room air. No large airway obstruction based on CXR findings. No radiopaque object identified. --Given the focal wheeze, obtain CT chest w/o contrast.  --Can try Duoneb prn  --If foreign body present in airway on CT chest, may consider bronchoscopy in the morning. Presently, no emergent indication. --Consider SLP swallow evaluation --Admit for observation to medicine team  Best practice:  Diet: NPO Pain/Anxiety/Delirium protocol (if indicated): N/A VAP protocol (if indicated): N/A DVT prophylaxis: SCDs GI prophylaxis: N/A Glucose control: per primary Mobility: OOB when able Family Communication: Discussed with patient and her husband at bedside Disposition: admit for obs to medicine  Labs   CBC: Recent Labs  Lab 09/05/19 2344  WBC 14.2*  NEUTROABS 7.5  HGB 13.4  HCT 39.9  MCV 94.8  PLT 294    Basic Metabolic Panel: Recent Labs  Lab 09/05/19 2344  NA 140  K 4.5  CL 105  CO2 23  GLUCOSE 138*  BUN 17  CREATININE 1.50*  CALCIUM 9.8   GFR: CrCl cannot be calculated (Unknown ideal weight.). Recent Labs  Lab 09/05/19 2344  WBC 14.2*   ABG    Component Value Date/Time   TCO2 26 06/06/2008 1118     Review of Systems:   Complete review of systems performed and negative except per HPI  Past Medical History  She,  has a past medical history of Ankle  fracture, Anxiety, Chronic urethritis, Degenerative disc disease, lumbar (2007), Depression, Diabetes mellitus (2004), Fibromyalgia, Glaucoma, Hypercholesteremia, IBS (irritable bowel syndrome), Metatarsal fracture, Migraine, Osteoarthritis, Overactive bladder, and Prolactin increased (05/2002).   Surgical History    Past Surgical History:  Procedure  Laterality Date  . ABDOMINAL HYSTERECTOMY    . APPENDECTOMY  age 73  . BREAST BIOPSY     x 2  . BREAST EXCISIONAL BIOPSY Left 1994  . BREAST EXCISIONAL BIOPSY Left 1994  . BURCH PROCEDURE    . CARPECTOMY HAND     LEFT FOR KEINBACH'S DZ  . CATARACT EXTRACTION    . CHOLECYSTECTOMY OPEN     WITH CDE  . CYSTOSCOPY     X 4  . FRACTURE METARSAL    . FUSION OF DISCS     C 3-4 4-5 5-6 VERTBRAES IN NECK DUE TO DEGEN. [place] DZ  . FUSION OF LEFT WRIST WITH PLATE AND SCREWS  1660   CADAVER BONE GRAFT AND APPLICATION OF HUMAN GROWTH FACTORS  . HEMORROIDECTOMY  age 52  . HYSTEROSCOPY     D&C,HYSTEROSCOPY FOR PED FIBROID   . INTERSTIM IMPLANT PLACEMENT    . OPEN REDUCTION INTERNAL FIXATION (ORIF) PROXIMAL PHALANX Left 10/30/2018   Procedure: OPEN REDUCTION WITH FIXATION LEFT MIDDLE FINGER;  Surgeon: Daryll Brod, MD;  Location: [facility];  Service: Orthopedics;  Laterality: Left;  . ORIF RADIAL HEAD / NECK FRACTURE  2010   RIGHT RADIAL HED EXTENDING INTO JOINT SPACE WITH PLATE AND SCREWS  . PERCUTANEOUS PINNING Left 01/31/2017   Procedure: PERCUTANEOUS PINNING EXTREMITYLEFT SMALL FINGER;  Surgeon: Daryll Brod, MD;  Location: [facility];  Service: Orthopedics;  Laterality: Left;  . REPAIR EXTENSOR TENDON Left 10/30/2018   Procedure: EXTENSOR TENDON REPAIR;  Surgeon: Daryll Brod, MD;  Location: [facility];  Service: Orthopedics;  Laterality: Left;  . RIGHT ANKLE FRACTURE    . ROTATOR CUFF REPAIR    . TONSILLECTOMY AND ADENOIDECTOMY    . TUBAL LIGATION    . WOUND EXPLORATION Left 10/30/2018   Procedure: EXPLORATION PROXIMAL INTERPHALANGEAL JOINT LEFT MIDDLE FINGER;  Surgeon: Daryll Brod, MD;  Location: [facility];  Service: Orthopedics;  Laterality: Left;     Social History   reports that she has never smoked. She has never used smokeless tobacco. She reports that she does not drink alcohol and does not use drugs.   Family History    Her family history includes Cancer in her maternal grandmother; Diabetes in her brother, father, mother, and sister; Heart attack in her brother, mother, and sister; Heart disease in her father and mother; Hypertension in her father and mother; Osteoarthritis in her sister; Other in her daughter and son; Rheumatic fever in her father.   Allergies Allergies  Allergen Reactions  . Empagliflozin Other (See Comments)    Pt stated, "This gave me a yeast infection"  . Adhesive [Tape] Other (See Comments)    Causes skin to peel and blister- ok with paper tape   . Oxycodone-Acetaminophen Nausea And Vomiting  . Oxymorphone Hcl Nausea And Vomiting  . Aspirin Swelling and Rash    "IN LARGE DOSES"- Body swells  . Ivp Dye [Iodinated Diagnostic Agents] Rash     Home Medications  Prior to Admission medications   Medication Sig Start Date End Date Taking? Authorizing Provider  aspirin 81 MG tablet Take 81 mg by mouth daily.     [provider]  atorvastatin (LIPITOR) 40 MG tablet  Take 40 mg by mouth every evening.     [provider]  BELSOMRA 20 MG TABS Take 20 mg by mouth at bedtime.  08/03/18   [provider]  brimonidine-timolol (COMBIGAN) 0.2-0.5 % ophthalmic solution Place 1 drop into both eyes every 12 (twelve) hours.  02/13/13   [provider]  calcium citrate-vitamin D (CITRACAL+D) 315-200 MG-UNIT tablet Take 1 tablet by mouth daily.     [provider]  clonazePAM (KLONOPIN) 0.5 MG tablet Take 1 mg by mouth at bedtime.  05/17/17   [provider]  conjugated estrogens (PREMARIN) vaginal cream INSERT 1 APPLICATORFUL  VAGINALLY 2 TIMES A WEEK. Patient taking differently: Place 1 Applicatorful vaginally 2 (two) times a week.  05/29/18   Fontaine, Nadyne Coombes, MD  diphenhydrAMINE (BENADRYL) 25 MG tablet Take 25 mg by mouth at bedtime.    [provider]  dorzolamide (TRUSOPT) 2 % ophthalmic solution Place 1 drop into both eyes 2 (two)  times daily.  10/11/14   [provider]  gabapentin (NEURONTIN) 800 MG tablet Take 800 mg by mouth at bedtime.    [provider]  Glucosamine-Chondroit-MSM-C-Mn (FLEXI JOINT PO) Take 2 capsules by mouth daily with breakfast.    [provider]  HYDROcodone-acetaminophen (NORCO) 5-325 MG tablet Take 1 tablet by mouth every 6 (six) hours as needed. 10/30/18   Cindee Salt, MD  HYDROmorphone (DILAUDID) 4 MG tablet Take 4 mg by mouth 2 (two) times daily as needed (for breakthrough pain).  02/02/15   [provider]  HYDROmorphone HCl 16 MG T24A Take 16 mg by mouth daily.  01/23/15   [provider]  lansoprazole (PREVACID 24HR) 15 MG capsule Take 15 mg by mouth at bedtime as needed (for reflux).    [provider]  latanoprost (XALATAN) 0.005 % ophthalmic solution Place 1 drop into both eyes at bedtime.     [provider]  levothyroxine (SYNTHROID) 100 MCG tablet Take 100 mcg by mouth daily.      [provider]  liraglutide (VICTOZA) 18 MG/3ML SOPN Inject 1.8 mg into the skin every morning.     [provider]  loratadine (CLARITIN) 10 MG tablet Take 10 mg by mouth daily.    [provider]  Multiple Vitamin (MULTIVITAMIN) tablet Take 1 tablet by mouth at bedtime.     [provider]  nadolol (CORGARD) 20 MG tablet Take 20 mg by mouth at bedtime.     [provider]  NON FORMULARY [facility]  Antifungal (nail)-#1    [provider]  oxybutynin (DITROPAN-XL) 10 MG 24 hr tablet Take 10 mg by mouth 2 (two) times daily.     [provider]  Probiotic Product (PROBIOTIC ADVANCED) CAPS Take 1 capsule by mouth daily.    [provider]  promethazine (PHENERGAN) 25 MG tablet Take 25 mg by mouth every 6 (six) hours as needed for nausea or vomiting.     [provider]  repaglinide (PRANDIN) 2 MG tablet Take 4 mg by mouth See admin instructions. Take 4 mg by  mouth in the morning before breakfast and 4 mg before evening meal 06/21/17   [provider]  rOPINIRole (REQUIP) 0.5 MG tablet Take 1.5 mg by mouth at bedtime.     [provider]  trimethoprim (TRIMPEX) 100 MG tablet Take 100 mg by mouth at bedtime.     [provider]  venlafaxine (EFFEXOR) 100 MG tablet Take 200 mg by mouth  every morning.    [provider]  venlafaxine (EFFEXOR) 75 MG tablet Take 75 mg by mouth at bedtime.    [provider]     Critical care time: The patient is critically ill with multiple organ systems failure and requires high complexity decision making for assessment and support, frequent evaluation and titration of therapies, application of advanced monitoring technologies and extensive interpretation of multiple databases.   Critical Care Time devoted to patient care services described in this note is  35 Minutes. This time reflects time of care of this signee. This critical care time does not reflect procedure time, or teaching time or supervisory time of PA/NP/Med student/Med Resident etc but could involve care discussion time.  Griffin Basil, M.D. [facility] Pulmonary/Critical Care Medicine After hours pager: [phone].

## 2019-09-06 NOTE — Progress Notes (Signed)
09/06/19: Patient seen and examined at bedside with her husband present.  Her cough is improved.  Personally reviewed CT scan done on admission.  No evidence of obstruction.  Seen by pulmonary this morning, appreciate Dr. Myrlene Broker evaluation.  No indication for bronchoscopy.  Speech therapist has been consulted for swallow evaluation.  N.p.o. until cleared by speech therapist.  Continue aspiration precautions.  Please refer to H&P dictated by my partner Dr. Julian Reil on 09/06/2019 for further details of the assessment and plan.

## 2019-09-06 NOTE — H&P (Signed)
History and Physical    Linda Lucas IHK:[id number] DOB: 1945-10-30 DOA: 09/05/2019  PCP: Merlene Laughter, MD  Patient coming from: Home  I have personally briefly reviewed patient's old medical records in [facility]  Chief Complaint: Aspiration of Pill  HPI: Linda Lucas is a 74 y.o. female with medical history significant of DM2, CKD3.  Pt presents to ED after apparently choking on and aspirating half of a large pill around 1030pm.  Pt continuing to have cough and choking symptoms in ED.   ED Course: Abdominal thrusts attempted by EDP x3.  Pt given ativan with improvement of choking symptoms.  CXR without obvious radio-opaque foreign body.  PCCM called, see their consult note, but they want to wait till AM before attempting bronchoscopy.  CT scan read pending.   Review of Systems: As per HPI, otherwise all review of systems negative.  Past Medical History:  Diagnosis Date  . Ankle fracture    bimeolar fracture  . Anxiety   . Chronic urethritis    frequent uti's and or urethritis  . Degenerative disc disease, lumbar 2007   bulging disc  . Depression   . Diabetes mellitus 2004  . Fibromyalgia   . Glaucoma   . Hypercholesteremia   . IBS (irritable bowel syndrome)   . 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 Migraine   . Osteoarthritis   . Overactive bladder   . Prolactin increased 05/2002   NORMAL MRI    Past Surgical History:  Procedure Laterality Date  . ABDOMINAL HYSTERECTOMY    . APPENDECTOMY  age 69  . BREAST BIOPSY     x 2  . BREAST EXCISIONAL BIOPSY Left 1994  . BREAST EXCISIONAL BIOPSY Left 1994  . BURCH PROCEDURE    . CARPECTOMY HAND     LEFT FOR KEINBACH'S DZ  . CATARACT EXTRACTION    . CHOLECYSTECTOMY OPEN     WITH CDE  . CYSTOSCOPY     X 4  . FRACTURE METARSAL    .  FUSION OF DISCS     C 3-4 4-5 5-6 VERTBRAES IN NECK DUE TO DEGEN. DISC DZ  . FUSION OF LEFT WRIST WITH PLATE AND SCREWS  2009   CADAVER BONE GRAFT AND APPLICATION OF HUMAN GROWTH FACTORS  . HEMORROIDECTOMY  age 47  . HYSTEROSCOPY     D&C,HYSTEROSCOPY FOR PED FIBROID   . INTERSTIM IMPLANT PLACEMENT    . OPEN REDUCTION INTERNAL FIXATION (ORIF) PROXIMAL PHALANX Left 10/30/2018   Procedure: OPEN REDUCTION WITH FIXATION LEFT MIDDLE FINGER;  Surgeon: Cindee Salt, MD;  Location: [facility];  Service: Orthopedics;  Laterality: Left;  . ORIF RADIAL HEAD / NECK FRACTURE  2010   RIGHT RADIAL HED EXTENDING INTO JOINT SPACE WITH PLATE AND SCREWS  . PERCUTANEOUS PINNING Left 01/31/2017   Procedure: PERCUTANEOUS PINNING EXTREMITYLEFT SMALL FINGER;  Surgeon: Cindee Salt, MD;  Location: [facility];  Service: Orthopedics;  Laterality: Left;  . REPAIR EXTENSOR TENDON Left 10/30/2018   Procedure: EXTENSOR TENDON REPAIR;  Surgeon: Cindee Salt, MD;  Location: [facility];  Service: Orthopedics;  Laterality: Left;  . RIGHT ANKLE FRACTURE    . ROTATOR CUFF REPAIR    . TONSILLECTOMY AND ADENOIDECTOMY    . TUBAL LIGATION    . WOUND EXPLORATION Left  10/30/2018   Procedure: EXPLORATION PROXIMAL INTERPHALANGEAL JOINT LEFT MIDDLE FINGER;  Surgeon: Cindee SaltKuzma, Gary, MD;  Location: [facility];  Service: Orthopedics;  Laterality: Left;     reports that she has never smoked. She has never used smokeless tobacco. She reports that she does not drink alcohol and does not use drugs.  Allergies  Allergen Reactions  . Empagliflozin Other (See Comments)    Pt stated, "This gave me a yeast infection"  . Adhesive [Tape] Other (See Comments)    Causes skin to peel and blister- ok with paper tape   . Oxycodone-Acetaminophen Nausea And Vomiting  . Oxymorphone Hcl Nausea And Vomiting  . Aspirin Swelling and Rash    "IN LARGE DOSES"- Body swells  . Ivp Dye [Iodinated  Diagnostic Agents] Rash    Family History  Problem Relation Age of Onset  . Other Daughter        needle stick at hospital died of AIDS and hepatiitis  . Diabetes Mother   . Hypertension Mother   . Heart disease Mother   . Heart attack Mother   . Diabetes Father   . Heart disease Father   . Hypertension Father   . Rheumatic fever Father   . Diabetes Sister   . Osteoarthritis Sister   . Heart attack Sister   . Diabetes Brother   . Heart attack Brother   . Cancer Maternal Grandmother        OVARIAN OR [place]  . Other Son        paralyzed after motorcycle accident     Prior to Admission medications   Medication Sig Start Date End Date Taking? Authorizing Provider  acetaminophen (TYLENOL) 500 MG tablet Take 1,000 mg by mouth every 6 (six) hours as needed.   Yes [provider]  aspirin 81 MG tablet Take 81 mg by mouth daily.    Yes [provider]  atorvastatin (LIPITOR) 40 MG tablet Take 40 mg by mouth every evening.    Yes [provider]  brimonidine-timolol (COMBIGAN) 0.2-0.5 % ophthalmic solution Place 1 drop into both eyes every 12 (twelve) hours.  02/13/13  Yes [provider]  buPROPion (WELLBUTRIN XL) 300 MG 24 hr tablet Take 300 mg by mouth daily. 08/17/19  Yes [provider]  calcium citrate-vitamin D (CITRACAL+D) 315-200 MG-UNIT tablet Take 1 tablet by mouth daily.    Yes [provider]  clonazepam (KLONOPIN) 0.125 MG disintegrating tablet Take 0.125 mg by mouth at bedtime as needed for sleep. 08/14/19  Yes [provider]  conjugated estrogens (PREMARIN) vaginal cream INSERT 1 APPLICATORFUL  VAGINALLY 2 TIMES A WEEK. Patient taking differently: Place 1 Applicatorful vaginally 2 (two) times a week.  05/29/18  Yes Fontaine, Nadyne Coombesimothy P, MD  diphenhydrAMINE (BENADRYL) 25 MG tablet Take 25 mg by mouth at bedtime.   Yes [provider]  diphenoxylate-atropine (LOMOTIL) 2.5-0.025 MG tablet Take 1 tablet by mouth 4  (four) times daily as needed for diarrhea or loose stools. 03/12/19  Yes [provider]  dorzolamide (TRUSOPT) 2 % ophthalmic solution Place 1 drop into both eyes 2 (two) times daily.  10/11/14  Yes [provider]  esomeprazole (NEXIUM) 20 MG capsule Take 20 mg by mouth daily at 12 noon.   Yes [provider]  gabapentin (NEURONTIN) 800 MG tablet Take 800 mg by mouth at bedtime.   Yes [provider]  HYDROmorphone (DILAUDID) 4 MG tablet Take 4 mg by mouth 2 (two) times daily  as needed (for breakthrough pain).  02/02/15  Yes [provider]  HYDROmorphone HCl (EXALGO) 12 MG TB24 Take 12 mg by mouth 2 (two) times daily as needed (Pain).   Yes [provider]  latanoprost (XALATAN) 0.005 % ophthalmic solution Place 1 drop into both eyes at bedtime.    Yes [provider]  levothyroxine (SYNTHROID) 100 MCG tablet Take 100 mcg by mouth daily.     Yes [provider]  loratadine (CLARITIN) 10 MG tablet Take 10 mg by mouth daily.   Yes [provider]  lubiprostone (AMITIZA) 8 MCG capsule Take 8 mcg by mouth at bedtime. 05/07/19  Yes [provider]  Multiple Vitamin (MULTIVITAMIN) tablet Take 1 tablet by mouth at bedtime.    Yes [provider]  nadolol (CORGARD) 20 MG tablet Take 20 mg by mouth at bedtime.    Yes [provider]  NON FORMULARY  apothecary  Antifungal (nail)-#1   Yes [provider]  OZEMPIC, 0.25 OR 0.5 MG/DOSE, 2 MG/1.5ML SOPN Inject 0.5 mg into the skin once a week. 07/30/19  Yes [provider]  Probiotic Product (PROBIOTIC ADVANCED) CAPS Take 1 capsule by mouth daily.   Yes [provider]  promethazine (PHENERGAN) 25 MG tablet Take 25 mg by mouth every 6 (six) hours as needed for nausea or vomiting.    Yes [provider]  repaglinide (PRANDIN) 2 MG tablet Take 4 mg by mouth 2 (two) times daily before a meal.  06/21/17  Yes [provider]  rOPINIRole (REQUIP) 0.5 MG tablet Take 1.5 mg by mouth at bedtime.    Yes [provider]  SSD 1 % cream Apply 1 application topically daily. 06/19/19  Yes [provider]  SUMAtriptan (IMITREX) 100 MG tablet Take 100 mg by mouth as needed for migraine. 08/24/19  Yes [provider]  trimethoprim (TRIMPEX) 100 MG tablet Take 100 mg by mouth at bedtime.    Yes [provider]  venlafaxine (EFFEXOR) 100 MG tablet Take 200 mg by mouth every morning.   Yes [provider]  venlafaxine (EFFEXOR) 75 MG tablet Take 75 mg by mouth at bedtime.   Yes [provider]  zolpidem (AMBIEN) 10 MG tablet Take 10 mg by mouth at bedtime. 07/27/19  Yes [provider]    Physical Exam: Vitals:   09/06/19 0120 09/06/19 0130 09/06/19 0138 09/06/19 0140  BP:  (!) 122/111    Pulse:  83    Resp: 18 (!) 26  (!) 23  Temp:      TempSrc:      SpO2:  96% 97%     Constitutional: NAD, calm, comfortable Eyes: PERRL, lids and conjunctivae normal ENMT: Mucous membranes are moist. Posterior pharynx clear of any exudate or lesions.Normal dentition.  Neck: normal, supple, no masses, no thyromegaly Respiratory: clear to auscultation bilaterally, no wheezing, no crackles. Normal respiratory effort. No accessory muscle use.  Cardiovascular: Regular rate and rhythm, no murmurs / rubs / gallops. No extremity edema. 2+ pedal pulses. No carotid bruits.  Abdomen: no tenderness, no masses palpated. No hepatosplenomegaly. Bowel sounds positive.  Musculoskeletal: no clubbing / cyanosis. No joint deformity upper and lower extremities. Good ROM, no contractures. Normal muscle tone.  Skin: no rashes, lesions, ulcers. No induration Neurologic: CN 2-12 grossly intact. Sensation intact, DTR normal. Strength 5/5 in all 4.  Psychiatric: Normal judgment and insight. Alert and oriented x 3. Normal mood.    Labs on Admission: I  have personally reviewed following labs  and imaging studies  CBC: Recent Labs  Lab 09/05/19 2344  WBC 14.2*  NEUTROABS 7.5  HGB 13.4  HCT 39.9  MCV 94.8  PLT 294   Basic Metabolic Panel: Recent Labs  Lab 09/05/19 2344  NA 140  K 4.5  CL 105  CO2 23  GLUCOSE 138*  BUN 17  CREATININE 1.50*  CALCIUM 9.8   GFR: CrCl cannot be calculated (Unknown ideal weight.). Liver Function Tests: No results for input(s): AST, ALT, ALKPHOS, BILITOT, PROT, ALBUMIN in the last 168 hours. No results for input(s): LIPASE, AMYLASE in the last 168 hours. No results for input(s): AMMONIA in the last 168 hours. Coagulation Profile: No results for input(s): INR, PROTIME in the last 168 hours. Cardiac Enzymes: No results for input(s): CKTOTAL, CKMB, CKMBINDEX, TROPONINI in the last 168 hours. BNP (last 3 results) No results for input(s): PROBNP in the last 8760 hours. HbA1C: No results for input(s): HGBA1C in the last 72 hours. CBG: No results for input(s): GLUCAP in the last 168 hours. Lipid Profile: No results for input(s): CHOL, HDL, LDLCALC, TRIG, CHOLHDL, LDLDIRECT in the last 72 hours. Thyroid Function Tests: No results for input(s): TSH, T4TOTAL, FREET4, T3FREE, THYROIDAB in the last 72 hours. Anemia Panel: No results for input(s): VITAMINB12, FOLATE, FERRITIN, TIBC, IRON, RETICCTPCT in the last 72 hours. Urine analysis:    Component Value Date/Time   COLORURINE YELLOW 06/12/2013 1612   APPEARANCEUR CLEAR 06/12/2013 1612   LABSPEC 1.022 06/12/2013 1612   PHURINE 5.0 06/12/2013 1612   GLUCOSEU 250 (A) 06/12/2013 1612   HGBUR NEG 06/12/2013 1612   BILIRUBINUR NEG 06/12/2013 1612   KETONESUR NEG 06/12/2013 1612   PROTEINUR NEG 06/12/2013 1612   UROBILINOGEN 0.2 06/12/2013 1612   NITRITE NEG 06/12/2013 1612   LEUKOCYTESUR TRACE (A) 06/12/2013 1612    Radiological Exams on Admission: DG Chest Portable 1 View  Result Date: 09/05/2019 CLINICAL DATA:  Cough and shortness of breath.  Choked on film. EXAM: PORTABLE  CHEST 1 VIEW COMPARISON:  Remote radiograph 03/08/2007 FINDINGS: Lungs symmetrically inflated. No evidence of pneumomediastinum or radiopaque foreign body. Upper normal heart size with normal mediastinal contours. No focal airspace disease or pleural effusion. No pulmonary edema. Hardware in the lower cervical spine is partially included. Chronic change in the right shoulder. IMPRESSION: No acute chest findings. Electronically Signed   By: Narda Rutherford M.D.   On: 09/05/2019 23:59    EKG: Independently reviewed.  Assessment/Plan Principal Problem:   Aspiration of foreign body into lung Active Problems:   DM2 (diabetes mellitus, type 2) (HCC)   Essential (primary) hypertension    1. Aspiration of foreign body (pill) - 1. CT chest shows no evidence of aspiration 1. Just an incidental 32mm pulm nodule, rec repeat CT in 6-12 months. 2. PCCM consulting 3. Strict NPO until we know what PCCM wants to do in AM 4. IVF: NS at 100 cc/hr to prevent dehydration 5. Tele monitor 6. Ativan PRN choking 7. Dilaudid PRN pain (takes PO dilaudid chronically) 8. Possible bronchoscopy in AM 2. DM2 - 1. Hold home hypoglycemics 2. Sensitive SSI Q4H 3. HTN - 1. Holding all home PO meds and keeping patient strict NPO due to aspiration  DVT prophylaxis: Lovenox Code Status: Full Family Communication: Husband at bedside Disposition Plan: Home after treatment for aspiration / SLP eval Consults called: pccm Admission status: Place in 87   Geralda Baumgardner M. DO [facility]  How to contact the [facility]  Attending or Consulting provider [place] or covering provider during after hours [place], for this patient?  1. Check the care team in [facility] and look for a) attending/consulting TRH provider listed and b) the [facility] team listed 2. Log into [email or web address]  Amion Physician Scheduling and messaging for groups and whole hospitals  On call and physician scheduling software for group practices, residents,  hospitalists and other medical providers for call, clinic, rotation and shift schedules. On[employer] is a hospital-wide system for scheduling doctors and paging doctors on call. EasyPlot is for scientific plotting and data analysis.  [email or web address]  and use [place]'s universal password to access. If you do not have the password, please contact the hospital operator.  3. Locate the [facility] provider you are looking for under [facility] and page to a number that you can be directly reached. 4. If you still have difficulty reaching the provider, please page the [facility] (Director on Call) for the Hospitalists listed on amion for assistance.  09/06/2019, 2:36 AM

## 2019-09-06 NOTE — Progress Notes (Addendum)
NAME:  Linda Lucas, MRN:  [id number], DOB:  January 02, 1946, LOS: 0 ADMISSION DATE:  09/05/2019, CONSULTATION DATE:  09/06/2019 REFERRING MD:  ED physician, CHIEF COMPLAINT:  Aspiration   Brief History   74 year old woman with history of DM, CKD3 presenting after potential aspiration of a pill.  History of present illness   74 year old woman with history of DM, CKD3 presenting after potential aspiration of a pill around 10:30pm tonight. She reports her husband had split her multivitamin tablet in half for her. She has had cough productive of yellow mucous. Denies hemoptysis. She felt like it was stuck in the center of her chest. She has been having some dyspnea. This happened recently where she was able to cough up the pill.   In the ED, heimlich maneuver was attempted. O2 sats have been 96-98% on room air. 1 view CXR with no pneumomediastinum or radiopaque foreign body. No focal abnormalities in lung parenchyma.  Past Medical History  DM, CKD3, anxiety  Significant Hospital Events   09/06/2019> Admit  Consults:  PCCM 6/25  Procedures:  None  Significant Diagnostic Tests:  CXR 09/05/2019: Lungs symmetrically inflated. No evidence of pneumomediastinum or radiopaque foreign body. No focal airspace disease or pleural effusion.  Micro Data:  COVID19 6/24> neg  Antimicrobials:  None  Interim history/subjective:  Reports that her cough is improved considerably  Objective   Blood pressure (!) 132/52, pulse 73, temperature 99.3 F (37.4 C), temperature source Oral, resp. rate 15, SpO2 100 %.        Intake/Output Summary (Last 24 hours) at 09/06/2019 0954 Last data filed at 09/06/2019 0441 Gross per 24 hour  Intake 0 ml  Output --  Net 0 ml   There were no vitals filed for this visit.  Examination: Elderly female in no acute distress.  Able to speak breathe and no evidence of any further choking is noted.  She does report sore throat. No JVD or lymphadenopathy is  appreciated Chest with some mild expiratory wheezes otherwise unremarkable Heart sounds are distant Abdomen is obese soft nontender Extremities are warm and dry   Assessment & Plan:  74 year old woman with history of DM, CKD3 presenting after potential aspiration of a pill.  CT scan of chest showed no evidence of aspiration Incidental finding of a 7 mm nodule in the left lung base medially with suggested follow-up in 6 to 12 months See any reason to perform a fiberoptic bronchoscopy at this time we will leave to the pulmonologist for further evaluation. Pulmonary critical care available as needed    Best practice:  Diet: NPO Pain/Anxiety/Delirium protocol (if indicated): N/A VAP protocol (if indicated): N/A DVT prophylaxis: SCDs GI prophylaxis: N/A Glucose control: per primary Mobility: OOB when able Family Communication: Discussed with patient and her husband at bedside Disposition: admit for obs to medicine  Labs   CBC: Recent Labs  Lab 09/05/19 2344  WBC 14.2*  NEUTROABS 7.5  HGB 13.4  HCT 39.9  MCV 94.8  PLT 623    Basic Metabolic Panel: Recent Labs  Lab 09/05/19 2344  NA 140  K 4.5  CL 105  CO2 23  GLUCOSE 138*  BUN 17  CREATININE 1.50*  CALCIUM 9.8   GFR: CrCl cannot be calculated (Unknown ideal weight.). Recent Labs  Lab 09/05/19 2344  WBC 14.2*   ABG    Component Value Date/Time   TCO2 26 06/06/2008 Fayette Minor ACNP Acute Care Nurse Practitioner [facility] Pulmonary/Critical  Care Please consult Amion 09/06/2019, 9:54 AM    PCCM:  Agree. Patient seen. CT reviewed. No evidence of obstruction or endobronchial disease.   BP (!) 132/52 (BP Location: Left Arm)   Pulse 73   Temp 99.3 F (37.4 C) (Oral)   Resp 15   SpO2 100%   Gen: elderly fm, comfortable Heart: RRR, s1 s2 Lung: CTAB, no rhonchi   CT reviewed: no obstruction   A: Possible aspiration of multivitamin Talking with patient this is a large pill. If it  was in her airway it would occlude a subsegment and be visible on CT.   P: No plans for bronchoscopy  Swallow w/u by primary  PCCM will sign off  Josephine Igo, DO [facility] [facility] 09/06/2019 11:03 AM

## 2019-09-06 NOTE — Progress Notes (Signed)
Admitted to 6n24 from ED. Ambulated to bed with standby assist.

## 2019-09-06 NOTE — ED Notes (Signed)
Attempted to call report to 6N.

## 2019-09-06 NOTE — Evaluation (Signed)
Clinical/Bedside Swallow Evaluation Patient Details  Name: Linda Lucas MRN: [id number] Date of Birth: [birth year]  Today's Date: 09/06/2019 Time: SLP Start Time (ACUTE ONLY): 1244 SLP Stop Time (ACUTE ONLY): 1307 SLP Time Calculation (min) (ACUTE ONLY): 23 min  Past Medical History:  Past Medical History:  Diagnosis Date  . Ankle fracture    bimeolar fracture  . Anxiety   . Chronic urethritis    frequent uti's and or urethritis  . Degenerative disc disease, lumbar 2007   bulging disc  . Depression   . Diabetes mellitus 2004  . Fibromyalgia   . Glaucoma   . Hypercholesteremia   . IBS (irritable bowel syndrome)   . 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 Migraine   . Osteoarthritis   . Overactive bladder   . Prolactin increased 05/2002   NORMAL MRI   Past Surgical History:  Past Surgical History:  Procedure Laterality Date  . ABDOMINAL HYSTERECTOMY    . APPENDECTOMY  age 25  . BREAST BIOPSY     x 2  . BREAST EXCISIONAL BIOPSY Left 1994  . BREAST EXCISIONAL BIOPSY Left 1994  . BURCH PROCEDURE    . CARPECTOMY HAND     LEFT FOR KEINBACH'S DZ  . CATARACT EXTRACTION    . CHOLECYSTECTOMY OPEN     WITH CDE  . CYSTOSCOPY     X 4  . FRACTURE METARSAL    . FUSION OF DISCS     C 3-4 4-5 5-6 VERTBRAES IN NECK DUE TO DEGEN. DISC DZ  . FUSION OF LEFT WRIST WITH PLATE AND SCREWS  2009   CADAVER BONE GRAFT AND APPLICATION OF HUMAN GROWTH FACTORS  . HEMORROIDECTOMY  age 79  . HYSTEROSCOPY     D&C,HYSTEROSCOPY FOR PED FIBROID   . INTERSTIM IMPLANT PLACEMENT    . OPEN REDUCTION INTERNAL FIXATION (ORIF) PROXIMAL PHALANX Left 10/30/2018   Procedure: OPEN REDUCTION WITH FIXATION LEFT MIDDLE FINGER;  Surgeon: Cindee Salt, MD;  Location: [facility];  Service: Orthopedics;  Laterality: Left;  . ORIF RADIAL  HEAD / NECK FRACTURE  2010   RIGHT RADIAL HED EXTENDING INTO JOINT SPACE WITH PLATE AND SCREWS  . PERCUTANEOUS PINNING Left 01/31/2017   Procedure: PERCUTANEOUS PINNING EXTREMITYLEFT SMALL FINGER;  Surgeon: Cindee Salt, MD;  Location: [facility];  Service: Orthopedics;  Laterality: Left;  . REPAIR EXTENSOR TENDON Left 10/30/2018   Procedure: EXTENSOR TENDON REPAIR;  Surgeon: Cindee Salt, MD;  Location: [facility];  Service: Orthopedics;  Laterality: Left;  . RIGHT ANKLE FRACTURE    . ROTATOR CUFF REPAIR    . TONSILLECTOMY AND ADENOIDECTOMY    . TUBAL LIGATION    . WOUND EXPLORATION Left 10/30/2018   Procedure: EXPLORATION PROXIMAL INTERPHALANGEAL JOINT LEFT MIDDLE FINGER;  Surgeon: Cindee Salt, MD;  Location:  SURGERY CENTER;  Service: Orthopedics;  Laterality: Left;   HPI:  Pt is a 74 yo female presenting with coughing and SOB after trouble swallowing half of a large pill, which felt like it was stuck in her throat. CXR clear; CT Chest with no evidence of aspiration. UGI in 2004 showed normal swallowing mechanism but wiht striking esophageal dysmotility with tertiary contractions and ineffective peristalsis. PMH also includes: DM, fibromyalgia, CKD, anxiety   Assessment / Plan / Recommendation Clinical Impression  Pt has baseline coughing that complicates clinical assessment of swallowing, but otherwise her oropharyngeal swallow appears functional. Oral motor exam is unremarkable. Pt denies any difficulties swallowing solids or liquids at home outside of her larger pills. Per chart review, pt had UGI in 2004 that showed marked esophageal dysmotility, although she and her husband do not recall getting those results. Suspect a primarily esophageal component - MD may want to consider esophageal w/u if indicated. For now, would start a mechanical soft diet and thin liquids. Would consider crushing her larger pills and mixing them in puree as she says she has been  having increasing difficulty getting these to go down. SLP will f/u acutely for tolerance. SLP Visit Diagnosis: Dysphagia, unspecified (R13.10)    Aspiration Risk  Mild aspiration risk;Moderate aspiration risk    Diet Recommendation Dysphagia 3 (Mech soft);Thin liquid   Liquid Administration via: Cup;Straw Medication Administration: Whole meds with puree (crush larger pills) Supervision: Patient able to self feed;Intermittent supervision to cue for compensatory strategies Compensations: Slow rate;Small sips/bites;Follow solids with liquid Postural Changes: Remain upright for at least 30 minutes after po intake;Seated upright at 90 degrees    Other  Recommendations Recommended Consults: Consider esophageal assessment Oral Care Recommendations: Oral care BID   Follow up Recommendations  (tba)      Frequency and Duration min 2x/week  1 week       Prognosis Prognosis for Safe Diet Advancement: Good      Swallow Study   General HPI: Pt is a 74 yo female presenting with coughing and SOB after trouble swallowing half of a large pill, which felt like it was stuck in her throat. CXR clear; CT Chest with no evidence of aspiration. UGI in 2004 showed normal swallowing mechanism but wiht striking esophageal dysmotility with tertiary contractions and ineffective peristalsis. PMH also includes: DM, fibromyalgia, CKD, anxiety Type of Study: Bedside Swallow Evaluation Previous Swallow Assessment: see HPI Diet Prior to this Study: NPO Temperature Spikes Noted: No Respiratory Status: Room air History of Recent Intubation: No Behavior/Cognition: Alert;Cooperative;Pleasant mood Oral Cavity Assessment: Within Functional Limits Oral Care Completed by SLP: No Oral Cavity - Dentition: Adequate natural dentition Vision: Functional for self-feeding Self-Feeding Abilities: Able to feed self Patient Positioning: Upright in bed Baseline Vocal Quality: Hoarse (mild, says it has been since she was  coughing so much) Volitional Cough: Strong Volitional Swallow: Able to elicit    Oral/Motor/Sensory Function Overall Oral Motor/Sensory Function: Within functional limits   Ice Chips Ice chips: Not tested   Thin Liquid Thin Liquid: Impaired Presentation: Cup;Self Fed;Straw Pharyngeal  Phase Impairments: Cough - Delayed    Nectar Thick Nectar Thick Liquid: Not tested   Honey Thick Honey Thick Liquid: Not tested   Puree Puree: Impaired Presentation: Self Fed;Spoon Pharyngeal Phase Impairments: Cough - Delayed   Solid     Solid: Impaired Presentation: Self Fed Pharyngeal Phase Impairments: Cough - Delayed      Osie Bond., M.A. [place] Pager [phone] Office [phone]  09/06/2019,3:03 PM

## 2019-09-07 DIAGNOSIS — T17808A Unspecified foreign body in other parts of respiratory tract causing other injury, initial encounter: Secondary | ICD-10-CM | POA: Diagnosis not present

## 2019-09-07 LAB — CBC WITH DIFFERENTIAL/PLATELET
Abs Immature Granulocytes: 0.05 10*3/uL (ref 0.00–0.07)
Basophils Absolute: 0.1 10*3/uL (ref 0.0–0.1)
Basophils Relative: 1 %
Eosinophils Absolute: 0.5 10*3/uL (ref 0.0–0.5)
Eosinophils Relative: 4 %
HCT: 35 % — ABNORMAL LOW (ref 36.0–46.0)
Hemoglobin: 11.6 g/dL — ABNORMAL LOW (ref 12.0–15.0)
Immature Granulocytes: 1 %
Lymphocytes Relative: 26 %
Lymphs Abs: 2.9 10*3/uL (ref 0.7–4.0)
MCH: 31.5 pg (ref 26.0–34.0)
MCHC: 33.1 g/dL (ref 30.0–36.0)
MCV: 95.1 fL (ref 80.0–100.0)
Monocytes Absolute: 1 10*3/uL (ref 0.1–1.0)
Monocytes Relative: 9 %
Neutro Abs: 6.5 10*3/uL (ref 1.7–7.7)
Neutrophils Relative %: 59 %
Platelets: 214 10*3/uL (ref 150–400)
RBC: 3.68 MIL/uL — ABNORMAL LOW (ref 3.87–5.11)
RDW: 12 % (ref 11.5–15.5)
WBC: 11.1 10*3/uL — ABNORMAL HIGH (ref 4.0–10.5)
nRBC: 0 % (ref 0.0–0.2)

## 2019-09-07 LAB — BASIC METABOLIC PANEL
Anion gap: 11 (ref 5–15)
BUN: 16 mg/dL (ref 8–23)
CO2: 25 mmol/L (ref 22–32)
Calcium: 8.9 mg/dL (ref 8.9–10.3)
Chloride: 105 mmol/L (ref 98–111)
Creatinine, Ser: 1.35 mg/dL — ABNORMAL HIGH (ref 0.44–1.00)
GFR calc Af Amer: 44 mL/min — ABNORMAL LOW (ref >60)
GFR calc non Af Amer: 38 mL/min — ABNORMAL LOW (ref >60)
Glucose, Bld: 98 mg/dL (ref 70–99)
Potassium: 3.9 mmol/L (ref 3.5–5.1)
Sodium: 141 mmol/L (ref 135–145)

## 2019-09-07 LAB — GLUCOSE, CAPILLARY
Glucose-Capillary: 130 mg/dL — ABNORMAL HIGH (ref 70–99)
Glucose-Capillary: 213 mg/dL — ABNORMAL HIGH (ref 70–99)
Glucose-Capillary: 74 mg/dL (ref 70–99)

## 2019-09-07 NOTE — Care Management (Signed)
Discussed HH needs with Dr. Margo Aye.  HH Pt/OT order intent is for eval if patient is not seen by PT in hospital.  PT did see patient and no further therapy needs are identified.  No DME needs.

## 2019-09-07 NOTE — Evaluation (Signed)
Physical Therapy Evaluation Patient Details Name: Linda Lucas MRN: [id number] DOB: 04-02-1945 Today's Date: 09/07/2019   History of Present Illness  Pt is a 74 y/o female admitted after aspirating on a pill. Chest CT showed no signs of aspiration; however, did reveal an an incidental 6mm pulm nodule with recs to follow up as outpatient. PMH including but not limited to DM, HTN, glaucoma and CKD.    Clinical Impression  Pt presented seated in recliner chair, initially asleep but easily aroused and willing to participate in therapy session. Pt's spouse present throughout session. She lives with her husband in a single level home with one small step to enter. At the time of evaluation, pt overall at a supervision to min guard level with functional mobility. Despite normally using a rollator for ambulation, pt declining use of RW today and instead used IV pole for support. She was a bit limited but mostly due to being very sleepy, stating she did not sleep well last night. Pt appears at or very close to her baseline with mobility. No further acute PT needs identified at this time. PT signing off.     Follow Up Recommendations No PT follow up;Supervision/Assistance - 24 hour    Equipment Recommendations  None recommended by PT    Recommendations for Other Services       Precautions / Restrictions Precautions Precautions: Fall Restrictions Weight Bearing Restrictions: No      Mobility  Bed Mobility               General bed mobility comments: pt received in recliner chair upon arrival  Transfers Overall transfer level: Needs assistance Equipment used: None Transfers: Sit to/from Stand Sit to Stand: Supervision         General transfer comment: steady with transition  Ambulation/Gait Ambulation/Gait assistance: Min guard Gait Distance (Feet): 40 Feet Assistive device: IV Pole Gait Pattern/deviations: Step-through pattern;Decreased stride length;Wide base of  support Gait velocity: decreased   General Gait Details: pt with increased lateral weight shift with gait, mild instability but no overt LOB. Declining use of RW and would likely have demonstrated improved stability as pt usually ambulates with a rollator  Stairs            Wheelchair Mobility    Modified Rankin (Stroke Patients Only)       Balance Overall balance assessment: Needs assistance Sitting-balance support: No upper extremity supported;Feet supported Sitting balance-Leahy Scale: Good     Standing balance support: Single extremity supported;Bilateral upper extremity supported Standing balance-Leahy Scale: Poor                               Pertinent Vitals/Pain Pain Assessment: No/denies pain    Home Living Family/patient expects to be discharged to:: Private residence Living Arrangements: Spouse/significant other Available Help at Discharge: Family Type of Home: House Home Access: Stairs to enter   Secretary/administrator of Steps: 1 Home Layout: One level Home Equipment: Environmental consultant - 4 wheels      Prior Function Level of Independence: Independent with assistive device(s)         Comments: ambulates with use of a rollator     Hand Dominance        Extremity/Trunk Assessment   Upper Extremity Assessment Upper Extremity Assessment: Overall WFL for tasks assessed    Lower Extremity Assessment Lower Extremity Assessment: Generalized weakness    Cervical / Trunk Assessment Cervical / Trunk Assessment: Kyphotic  Communication   Communication: No difficulties  Cognition Arousal/Alertness: Awake/alert Behavior During Therapy: WFL for tasks assessed/performed Overall Cognitive Status: Within Functional Limits for tasks assessed                                        General Comments      Exercises     Assessment/Plan    PT Assessment Patent does not need any further PT services  PT Problem List          PT Treatment Interventions      PT Goals (Current goals can be found in the Care Plan section)  Acute Rehab PT Goals Patient Stated Goal: for her voice to return to normal PT Goal Formulation: All assessment and education complete, DC therapy    Frequency     Barriers to discharge        Co-evaluation               AM-PAC PT "6 Clicks" Mobility  Outcome Measure Help needed turning from your back to your side while in a flat bed without using bedrails?: None Help needed moving from lying on your back to sitting on the side of a flat bed without using bedrails?: None Help needed moving to and from a bed to a chair (including a wheelchair)?: None Help needed standing up from a chair using your arms (e.g., wheelchair or bedside chair)?: None Help needed to walk in hospital room?: A Little Help needed climbing 3-5 steps with a railing? : A Little 6 Click Score: 22    End of Session   Activity Tolerance: Patient tolerated treatment well Patient left: in chair;with call bell/phone within reach;with family/visitor present Nurse Communication: Mobility status PT Visit Diagnosis: Other abnormalities of gait and mobility (R26.89)    Time: 1000-1011 PT Time Calculation (min) (ACUTE ONLY): 11 min   Charges:   PT Evaluation $PT Eval Low Complexity: 1 Low          Eduard Clos, PT, DPT  Acute Rehabilitation Services Pager [phone] Office [place] 09/07/2019, 10:39 AM

## 2019-09-07 NOTE — Progress Notes (Signed)
SLP Cancellation Note  Patient Details Name: Linda Lucas MRN: [id number] DOB: [birth year]   Cancelled treatment:       Reason Eval/Treat Not Completed: Other (comment) Attempted to see pt this morning for ongoing assessment of swallowing. Pt was wanting to urgently get to the bathroom; assisted there by RN. She denies trouble swallowing - note her ongoing cough in the absence of POs. Will continue to follow up as able.    Mahala Menghini., M.A. CCC-SLP Acute Rehabilitation Services Pager [phone] Office [phone]  09/07/2019, 8:19 AM

## 2019-09-07 NOTE — Discharge Summary (Signed)
Discharge Summary  Linda Lucas VEH:[id number] DOB: [birth year]  PCP: Merlene Laughter, MD  Admit date: 09/05/2019 Discharge date: 09/07/2019  Time spent: 35 minutes  Recommendations for Outpatient Follow-up:  1. Follow-up with your PCP 2. Obtain referral for GI from your PCP. 3. Take your medications as prescribed 4. Continue fall precautions  Discharge Diagnoses:  Active Hospital Problems   Diagnosis Date Noted  . Aspiration of foreign body into lung 09/06/2019  . Essential (primary) hypertension 04/03/2015  . DM2 (diabetes mellitus, type 2) [facility])     Resolved Hospital Problems   Diagnosis Date Noted Date Resolved  . Aspiration of foreign body 09/06/2019 09/06/2019    Discharge Condition: Stable  Diet recommendation: Recommendation per speech therapist: Diet Recommendation: Dysphagia 3 (Mech soft);Thin liquid   Liquid Administration via: Cup;Straw Medication Administration: Whole meds with puree (crush larger pills) Supervision: Patient able to self feed;Intermittent supervision to cue for compensatory strategies Compensations: Slow rate;Small sips/bites;Follow solids with liquid Postural Changes: Remain upright for at least 30 minutes after po intake;Seated upright at 90 degrees      Vitals:   09/07/19 0144 09/07/19 0548  BP: (!) 127/42 (!) 126/41  Pulse: 71 72  Resp: 17 17  Temp: 98.2 F (36.8 C) 98.1 F (36.7 C)  SpO2: 99% 99%    History of present illness:  Linda Lucas is a 74 y.o. female with medical history significant of DM2, CKD3.  Pt presents to ED after apparently choking on and aspirating half of a large pill around 1030pm on 09/05/2019.  Cough and choking symptoms in ED. CT chest completed no evidence of obstruction.  Seen by pulmonary, appreciate Dr. Myrlene Broker evaluation.  No indication for bronchoscopy.  Speech therapist consulted for swallow evaluation, recommendations as stated above.  09/07/19: Seen and examined this morning.  She has no new  complaints.  She is eager to go home.  Unwitnessed fall per bedside RN overnight 09/07/2019 at 0 400, found on the floor.  Per bedside RN's note, Bhunu, B.: <<pt found sitting on floor. Unwitnessed fall with no injuries. MD notified as charted in previous note.>>.  PT has been consulted to assess for ambulatory dysfunction prior to discharge.  May need home health PT OT.  TOC team consulted to assist with home health services if needed.   Hospital Course:  Principal Problem:   Aspiration of foreign body into lung Active Problems:   DM2 (diabetes mellitus, type 2) (HCC)   Essential (primary) hypertension  1. Aspiration of foreign body (pill) in the setting of dysphagia 1. CT chest shows no evidence of aspiration 1. An incidental 73mm pulm nodule, rec repeat CT in 6-12 months. 2. Seen by PCCM, no indication for bronchoscopy, signed off. 3. Seen by speech therapy with recommendations as stated above. 4. Received IV fluid hydration. 2. DM2, well controlled- 1. Hemoglobin A1c 7.1 on 09/06/2019           Resume home regimen  3. HTN - Blood pressure is at goal.  Resume home regimen.   Follow-up with your PCP  Possible ambulatory dysfunction PT to assess May benefit from PT OT at home [facility] consulted to assist with home health services Continue fall precautions   Code Status: Full  Consult: Pulmonary   Discharge Exam: BP (!) 126/41 (BP Location: Left Arm)   Pulse 72   Temp 98.1 F (36.7 C) (Oral)   Resp 17   SpO2 99%  . General: 74 y.o. year-old female well developed well nourished in  no acute distress.  Alert and interactive. . Cardiovascular: Regular rate and rhythm with no rubs or gallops.  No thyromegaly or JVD noted.   Marland Kitchen. Respiratory: Clear to auscultation with no wheezes or rales. Good inspiratory effort. . Abdomen: Soft nontender nondistended with normal bowel sounds x4 quadrants. . Musculoskeletal: No lower extremity edema. 2/4 pulses in all 4 extremities. Marland Kitchen. Psychiatry:  Mood is appropriate for condition and setting  Discharge Instructions You were cared for by a hospitalist during your hospital stay. If you have any questions about your discharge medications or the care you received while you were in the hospital after you are discharged, you can call the unit and asked to speak with the hospitalist on call if the hospitalist that took care of you is not available. Once you are discharged, your primary care physician will handle any further medical issues. Please note that NO REFILLS for any discharge medications will be authorized once you are discharged, as it is imperative that you return to your primary care physician (or establish a relationship with a primary care physician if you do not have one) for your aftercare needs so that they can reassess your need for medications and monitor your lab values.   Allergies as of 09/07/2019      Reactions   Empagliflozin Other (See Comments)   Pt stated, "This gave me a yeast infection"   Adhesive [tape] Other (See Comments)   Causes skin to peel and blister- ok with paper tape   Oxycodone-acetaminophen Nausea And Vomiting   Oxymorphone Hcl Nausea And Vomiting   Aspirin Swelling, Rash   "IN LARGE DOSES"- Body swells   Ivp Dye [iodinated Diagnostic Agents] Rash      Medication List    TAKE these medications   acetaminophen 500 MG tablet Commonly known as: TYLENOL Take 1,000 mg by mouth every 6 (six) hours as needed.   aspirin 81 MG tablet Take 81 mg by mouth daily.   atorvastatin 40 MG tablet Commonly known as: LIPITOR Take 40 mg by mouth every evening.   buPROPion 300 MG 24 hr tablet Commonly known as: WELLBUTRIN XL Take 300 mg by mouth daily.   calcium citrate-vitamin D 315-200 MG-UNIT tablet Commonly known as: CITRACAL+D Take 1 tablet by mouth daily.   clonazepam 0.125 MG disintegrating tablet Commonly known as: KLONOPIN Take 0.125 mg by mouth at bedtime as needed for sleep.   Combigan  0.2-0.5 % ophthalmic solution Generic drug: brimonidine-timolol Place 1 drop into both eyes every 12 (twelve) hours.   conjugated estrogens vaginal cream Commonly known as: Premarin INSERT 1 APPLICATORFUL  VAGINALLY 2 TIMES A WEEK. What changed:   how much to take  how to take this  when to take this  additional instructions   diphenhydrAMINE 25 MG tablet Commonly known as: BENADRYL Take 25 mg by mouth at bedtime.   diphenoxylate-atropine 2.5-0.025 MG tablet Commonly known as: LOMOTIL Take 1 tablet by mouth 4 (four) times daily as needed for diarrhea or loose stools.   dorzolamide 2 % ophthalmic solution Commonly known as: TRUSOPT Place 1 drop into both eyes 2 (two) times daily.   esomeprazole 20 MG capsule Commonly known as: NEXIUM Take 20 mg by mouth daily at 12 noon.   gabapentin 800 MG tablet Commonly known as: NEURONTIN Take 800 mg by mouth at bedtime.   HYDROmorphone HCl 12 MG Tb24 Commonly known as: EXALGO Take 12 mg by mouth 2 (two) times daily as needed (Pain).   HYDROmorphone 4  MG tablet Commonly known as: DILAUDID Take 4 mg by mouth 2 (two) times daily as needed (for breakthrough pain).   latanoprost 0.005 % ophthalmic solution Commonly known as: XALATAN Place 1 drop into both eyes at bedtime.   loratadine 10 MG tablet Commonly known as: CLARITIN Take 10 mg by mouth daily.   lubiprostone 8 MCG capsule Commonly known as: AMITIZA Take 8 mcg by mouth at bedtime.   multivitamin tablet Take 1 tablet by mouth at bedtime.   nadolol 20 MG tablet Commonly known as: CORGARD Take 20 mg by mouth at bedtime.   NON FORMULARY [facility]  Antifungal (nail)-#1   Ozempic (0.25 or 0.5 MG/DOSE) 2 MG/1.5ML Sopn Generic drug: Semaglutide(0.25 or 0.5MG /DOS) Inject 0.5 mg into the skin once a week.   Probiotic Advanced Caps Take 1 capsule by mouth daily.   promethazine 25 MG tablet Commonly known as: PHENERGAN Take 25 mg by mouth every 6 (six)  hours as needed for nausea or vomiting.   repaglinide 2 MG tablet Commonly known as: PRANDIN Take 4 mg by mouth 2 (two) times daily before a meal.   rOPINIRole 0.5 MG tablet Commonly known as: REQUIP Take 1.5 mg by mouth at bedtime.   SSD 1 % cream Generic drug: silver sulfADIAZINE Apply 1 application topically daily.   SUMAtriptan 100 MG tablet Commonly known as: IMITREX Take 100 mg by mouth as needed for migraine.   Synthroid 100 MCG tablet Generic drug: levothyroxine Take 100 mcg by mouth daily.   trimethoprim 100 MG tablet Commonly known as: TRIMPEX Take 100 mg by mouth at bedtime.   venlafaxine 75 MG tablet Commonly known as: EFFEXOR Take 75 mg by mouth at bedtime.   venlafaxine 100 MG tablet Commonly known as: EFFEXOR Take 200 mg by mouth every morning.   zolpidem 10 MG tablet Commonly known as: AMBIEN Take 10 mg by mouth at bedtime.      Allergies  Allergen Reactions  . Empagliflozin Other (See Comments)    Pt stated, "This gave me a yeast infection"  . Adhesive [Tape] Other (See Comments)    Causes skin to peel and blister- ok with paper tape   . Oxycodone-Acetaminophen Nausea And Vomiting  . Oxymorphone Hcl Nausea And Vomiting  . Aspirin Swelling and Rash    "IN LARGE DOSES"- Body swells  . Ivp Dye [Iodinated Diagnostic Agents] Rash    Follow-up Information    Stoneking, Ann Maki, MD. Call in 1 day(s).   Specialty: Internal Medicine Why: please call for a post hospital follow up appointment Contact information: [address] [place] [address] [phone]                The results of significant diagnostics from this hospitalization (including imaging, microbiology, ancillary and laboratory) are listed below for reference.    Significant Diagnostic Studies: CT Chest Wo Contrast  Result Date: 09/06/2019 CLINICAL DATA:  Possible foreign body aspiration EXAM: CT CHEST WITHOUT CONTRAST TECHNIQUE: Multidetector CT imaging  of the chest was performed following the standard protocol without IV contrast. COMPARISON:  None. FINDINGS: Cardiovascular: No significant vascular findings. Normal heart size. No pericardial effusion. Mediastinum/Nodes: No enlarged mediastinal or axillary lymph nodes. Thyroid gland, trachea, and esophagus demonstrate no significant findings. Lungs/Pleura: Lungs are clear. No pleural effusion or pneumothorax. There is a calcified granuloma in the left upper lobe. No evidence of aspiration. 7 mm nodular opacity of the medial left lung base (image 77). Upper Abdomen: No acute abnormality. Musculoskeletal:  No chest wall mass or suspicious bone lesions identified. IMPRESSION: 1. No evidence of aspiration. 2. 7 mm nodular opacity of the medial left lung base. Non-contrast chest CT at 6-12 months is recommended. If the nodule is stable at time of repeat CT, then future CT at 18-24 months (from today's scan) is considered optional for low-risk patients, but is recommended for high-risk patients. This recommendation follows the consensus statement: Guidelines for Management of Incidental Pulmonary Nodules Detected on CT Images: From the Fleischner Society 2017; Radiology 2017; 284:228-243. Electronically Signed   By: Ulyses Jarred M.D.   On: 09/06/2019 02:40   DG Chest Portable 1 View  Result Date: 09/05/2019 CLINICAL DATA:  Cough and shortness of breath.  Choked on film. EXAM: PORTABLE CHEST 1 VIEW COMPARISON:  Remote radiograph 03/08/2007 FINDINGS: Lungs symmetrically inflated. No evidence of pneumomediastinum or radiopaque foreign body. Upper normal heart size with normal mediastinal contours. No focal airspace disease or pleural effusion. No pulmonary edema. Hardware in the lower cervical spine is partially included. Chronic change in the right shoulder. IMPRESSION: No acute chest findings. Electronically Signed   By: Keith Rake M.D.   On: 09/05/2019 23:59   MM 3D SCREEN BREAST BILATERAL  Result Date:  08/30/2019 CLINICAL DATA:  Screening. EXAM: DIGITAL SCREENING BILATERAL MAMMOGRAM WITH TOMO AND CAD COMPARISON:  Previous exam(s). ACR Breast Density Category c: The breast tissue is heterogeneously dense, which may obscure small masses. FINDINGS: There are no findings suspicious for malignancy. Images were processed with CAD. IMPRESSION: No mammographic evidence of malignancy. A result letter of this screening mammogram will be mailed directly to the patient. RECOMMENDATION: Screening mammogram in one year. (Code:SM-B-01Y) BI-RADS CATEGORY  1: Negative. Electronically Signed   By: Lovey Newcomer M.D.   On: 08/30/2019 08:17    Microbiology: Recent Results (from the past 240 hour(s))  SARS Coronavirus 2 by RT PCR (hospital order, performed in [facility] hospital lab) Nasopharyngeal Nasopharyngeal Swab     Status: None   Collection Time: 09/05/19 11:42 PM   Specimen: Nasopharyngeal Swab  Result Value Ref Range Status   SARS Coronavirus 2 NEGATIVE NEGATIVE Final    Comment: (NOTE) SARS-CoV-2 target nucleic acids are NOT DETECTED.  The SARS-CoV-2 RNA is generally detectable in upper and lower respiratory specimens during the acute phase of infection. The lowest concentration of SARS-CoV-2 viral copies this assay can detect is 250 copies / mL. A negative result does not preclude SARS-CoV-2 infection and should not be used as the sole basis for treatment or other patient management decisions.  A negative result may occur with improper specimen collection / handling, submission of specimen other than nasopharyngeal swab, presence of viral mutation(s) within the areas targeted by this assay, and inadequate number of viral copies (<250 copies / mL). A negative result must be combined with clinical observations, patient history, and epidemiological information.  Fact Sheet for Patients:   StrictlyIdeas.no  Fact Sheet for Healthcare  Providers: BankingDealers.co.za  This test is not yet approved or  cleared by the [place] FDA and has been authorized for detection and/or diagnosis of SARS-CoV-2 by FDA under an Emergency Use Authorization (EUA).  This EUA will remain in effect (meaning this test can be used) for the duration of the COVID-19 declaration under Section 564(b)(1) of the Act, 21 U.S.C. section 360bbb-3(b)(1), unless the authorization is terminated or revoked sooner.  Performed at [facility], [place] [address], [place], [place] [address]      Labs: Basic Metabolic  Panel: Recent Labs  Lab 09/05/19 2344 09/07/19 0317  NA 140 141  K 4.5 3.9  CL 105 105  CO2 23 25  GLUCOSE 138* 98  BUN 17 16  CREATININE 1.50* 1.35*  CALCIUM 9.8 8.9   Liver Function Tests: No results for input(s): AST, ALT, ALKPHOS, BILITOT, PROT, ALBUMIN in the last 168 hours. No results for input(s): LIPASE, AMYLASE in the last 168 hours. No results for input(s): AMMONIA in the last 168 hours. CBC: Recent Labs  Lab 09/05/19 2344 09/07/19 0317  WBC 14.2* 11.1*  NEUTROABS 7.5 6.5  HGB 13.4 11.6*  HCT 39.9 35.0*  MCV 94.8 95.1  PLT 294 214   Cardiac Enzymes: No results for input(s): CKTOTAL, CKMB, CKMBINDEX, TROPONINI in the last 168 hours. BNP: BNP (last 3 results) No results for input(s): BNP in the last 8760 hours.  ProBNP (last 3 results) No results for input(s): PROBNP in the last 8760 hours.  CBG: Recent Labs  Lab 09/06/19 1153 09/06/19 2006 09/07/19 0012 09/07/19 0357 09/07/19 0804  GLUCAP 144* 238* 130* 74 213*       Signed:  Darlin Drop, MD [facility] 09/07/2019, 9:48 AM

## 2019-09-07 NOTE — Progress Notes (Signed)
DC instructions reviewed with patient and spouse at bedside.  Both verbalized understanding and declined further need of services. Patient transported off unit via wheelchair and staff for discharge.  Renda Rolls RN 09/07/19  @ 1130

## 2019-09-07 NOTE — Discharge Instructions (Signed)
Choking, Adult Choking occurs when a food or object gets stuck in the throat or windpipe (trachea) and blocks the airway. If the airway is partly blocked, coughing will usually cause the food or object to come out. If the airway is completely blocked, immediate action is needed to make it come out. The kind of treatment you offer depends on the severity of the choking. Signs of choking A person has a complete airway blockage if he or she:  Is unable to breathe.  Is making soft or high-pitched sounds while breathing.  Is unable to cough or is coughing weakly, ineffectively, or silently.  Is unable to cry, speak, or make sounds.  Is turning blue or grey.  Is holding his or her neck with both hands. This is considered a universal sign of choking.  Nods "yes" when asked if he or she is choking. Follow these instructions at home: For partial airway blockage If a person has a partial airway blockage and he or she is coughing and is able to speak:  Do not interfere. Allow coughing to clear the airway.  Do not let him or her try to drink until the food or object comes out.  Stay with the person until the food or object comes out. Watch for signs of choking (complete airway blockage). If the person shows signs of complete airway blockage, you should take action to help the person. For complete airway blockage If a person has a complete airway blockage, his or her life is in danger. Choking causes breathing to stop. This is a medical emergency that requires fast, appropriate action by anyone who is available. To save a person who is choking: 1. Encourage the person to cough. If this does not relieve the blockage, go to Step 2. 2. Perform a Heimlich maneuver. The Heimlich maneuver, also called abdominal thrusts, uses pressure to force air from the abdomen into the throat to move a blockage. Heimlich maneuver for choking For a person who is sitting or standing: 1. Ask the person whether he or  she is choking. If the person nods or gives the universal sign of choking, continue to step 2. 2. Shout for help. If someone responds, tell that person to call local emergency services (911 in the [place] 3. Stand or kneel behind the person who is choking and lean him or her forward slightly. 4. Make a fist with one hand, put your arms around the person's midsection, and grasp your fist with your other hand. Place the thumb side of your fist against the person's abdomen, just below the ribs and above the belly button area. 5. Quickly and firmly press inward (toward you) and upward with both hands. Repeat this 5 times as necessary. ? If the person is pregnant or obese and you cannot wrap your arms around the person's midsection, wrap your arms around the chest (under the armpits), place your hands over the breastbone, and do chest thrusts. 6. Repeat this maneuver until the object comes out and the person is able to breathe, or until the person becomes unconscious. 7. Anyone who has been given the Heimlich maneuver should be seen by a health care provider after the event.  For a person who is unconscious (if the choking person collapses or is found on the ground and you know the person was choking): 1. Shout for help. ? If someone responds, tell that person to call local emergency services (911 in [place] and look for an AED. ? If no one  responds, call local emergency services (911 in the [place] yourself, if possible. 2. Make sure the person is lying on a firm, flat surface, facing up. Begin CPR, starting with 30 compressions and 2 breaths. Every time you open the airway to give rescue breaths, open the person's mouth. If you can see the food or object and it can be easily pulled out, remove it with your fingers. Remember: ? Do not stick your fingers into the victim's throat as this may cause the food or object to go in farther. ? Do not try to remove the food or object if you cannot see it. Blind finger  sweeps can push it farther into the airway. 3. After 5 cycles or 2 minutes of CPR, call local emergency services (911 in [place] if a call has not already been made. 4. Continue CPR until the person starts breathing or until help arrives. If you are choking: 1. Call local emergency services (911 in [place] Do not worry about communicating what is happening. Do not hang up the phone. Someone may be sent to help you anyway. 2. Make a fist with one hand. Put the thumb side of the fist against your abdomen, just above the belly button and well below the breastbone. If you are pregnant or obese, put your fist on your chest instead, just below the breastbone and just above your lowest ribs. 3. Hold your fist with your other hand and bend over a hard surface, such as a table or chair. 4. Forcefully push your fist in and up. 5. Continue to do this until the food or object comes out.  Prevention  Chew food thoroughly.  Avoid talking while you are chewing and swallowing. To be prepared if choking occurs, take a certified first-aid course to learn how to correctly perform the Heimlich maneuver. Contact a health care provider if:  You have trouble swallowing food. Get help right away if:  You have problems breathing after choking stops.  You were given the Heimlich maneuver. Summary  Choking occurs when a food or object gets stuck in the throat (trachea) and blocks the airway.  If a person has a partial airway blockage and is able to talk and cough, do not interfere and do not allow the person to drink until the food or object comes out.  If a person has a complete airway blockage, perform the Heimlich maneuver. Call local emergency services and take the person to a health care provider afterward.  If the person is unconscious, call local emergency services and perform CPR until the person breathes normally or until help arrives. This information is not intended to replace advice given to you by  your health care provider. Make sure you discuss any questions you have with your health care provider. Document Revised: 06/20/2018 Document Reviewed: 03/21/2018 Elsevier Patient Education  [place].  Dysphagia  Dysphagia is trouble swallowing. This condition occurs when solids and liquids stick in a person's throat on the way down to the stomach, or when food takes longer to get to the stomach than usual. You may have problems swallowing food, liquids, or both. You may also have pain while trying to swallow. It may take you more time and effort to swallow something. What are the causes? This condition may be caused by:  Muscle problems. They may make it difficult for you to move food and liquids through the esophagus, which is the tube that connects your mouth to your stomach.  Blockages. You may  have ulcers, scar tissue, or inflammation that blocks the normal passage of food and liquids. Causes of these problems include: ? Acid reflux from your stomach into your esophagus (gastroesophageal reflux). ? Infections. ? Radiation treatment for cancer. ? Medicines taken without enough fluids to wash them down into your stomach.  Stroke. This can affect the nerves and make it difficult to swallow.  Nerve problems. These prevent signals from being sent to the muscles of your esophagus to squeeze (contract) and move what you swallow down to your stomach.  Globus pharyngeus. This is a common problem that involves a feeling like something is stuck in your throat or a sense of trouble with swallowing, even though nothing is wrong with the swallowing passages.  Certain conditions, such as cerebral palsy or Parkinson's disease. What are the signs or symptoms? Common symptoms of this condition include:  A feeling that solids or liquids are stuck in your throat on the way down to the stomach.  Pain while swallowing.  Coughing or gagging while trying to swallow. Other symptoms  include:  Food moving back from your stomach to your mouth (regurgitation).  Noises coming from your throat.  Chest discomfort with swallowing.  A feeling of fullness when swallowing.  Drooling, especially when the throat is blocked.  Heartburn. How is this diagnosed? This condition may be diagnosed by:  Barium X-ray. In this test, you will swallow a white liquid that sticks to the inside of your esophagus. X-ray images are then taken.  Endoscopy. In this test, a flexible telescope is inserted down your throat to look at your esophagus and your stomach.  CT scans and an MRI. How is this treated? Treatment for dysphagia depends on the cause of this condition, such as:  If the dysphagia is caused by acid reflux or infection, medicines may be used. They may include antibiotics and heartburn medicines.  If the dysphagia is caused by problems with the muscles, swallowing therapy may be used to help you strengthen your swallowing muscles. You may have to do specific exercises to strengthen the muscles or stretch them.  If the dysphagia is caused by a blockage or mass, procedures to remove the blockage may be done. You may need surgery and a feeding tube. You may need to make diet changes. Ask your health care provider for specific instructions. Follow these instructions at home: Medicines  Take over-the-counter and prescription medicines only as told by your health care provider.  If you were prescribed an antibiotic medicine, take it as told by your health care provider. Do not stop taking the antibiotic even if you start to feel better. Eating and drinking   Follow any diet changes as told by your health care provider.  Work with a diet and nutrition specialist (dietitian) to create an eating plan that will help you get the nutrients you need in order to stay healthy.  Eat soft foods that are easier to swallow.  Cut your food into small pieces and eat slowly. Take small  bites.  Eat and drink only when you are sitting upright.  Do not drink alcohol or caffeine. If you need help quitting, ask your health care provider. General instructions  Check your weight every day to make sure you are not losing weight.  Do not use any products that contain nicotine or tobacco, such as cigarettes, e-cigarettes, and chewing tobacco. If you need help quitting, ask your health care provider.  Keep all follow-up visits as told by your health care  provider. This is important. Contact a health care provider if you:  Lose weight because you cannot swallow.  Cough when you drink liquids.  Cough up partially digested food. Get help right away if you:  Cannot swallow your saliva.  Have shortness of breath, a fever, or both.  Have a hoarse voice and also have trouble swallowing. Summary  Dysphagia is trouble swallowing. This condition occurs when solids and liquids stick in a person's throat on the way down to the stomach. You may cough or gag while trying to swallow.  Dysphagia has many possible causes.  Treatment for dysphagia depends on the cause of the condition.  Keep all follow-up visits as told by your health care provider. This is important. This information is not intended to replace advice given to you by your health care provider. Make sure you discuss any questions you have with your health care provider. Document Revised: 07/25/2018 Document Reviewed: 07/25/2018 Elsevier Patient Education  2020 [employer].

## 2019-09-07 NOTE — Progress Notes (Signed)
°   09/07/19 0400  What Happened  Was fall witnessed? No  Was patient injured? No  Patient found on floor  Found by Staff-comment  Stated prior activity to/from bed, chair, or stretcher  Follow Up  Time MD notified [phone]  Family notified Yes - comment  Time family notified [id number]  pt found sitting on floor. Unwitnessed fall with no injuries. MD notified as charted in previous note.

## 2019-09-10 DIAGNOSIS — N1832 Chronic kidney disease, stage 3b: Secondary | ICD-10-CM | POA: Diagnosis not present

## 2019-09-10 DIAGNOSIS — R269 Unspecified abnormalities of gait and mobility: Secondary | ICD-10-CM | POA: Diagnosis not present

## 2019-09-10 DIAGNOSIS — I129 Hypertensive chronic kidney disease with stage 1 through stage 4 chronic kidney disease, or unspecified chronic kidney disease: Secondary | ICD-10-CM | POA: Diagnosis not present

## 2019-09-10 DIAGNOSIS — R05 Cough: Secondary | ICD-10-CM | POA: Diagnosis not present

## 2019-09-23 ENCOUNTER — Ambulatory Visit: Payer: Medicare PPO | Admitting: Podiatry

## 2019-09-26 DIAGNOSIS — H401113 Primary open-angle glaucoma, right eye, severe stage: Secondary | ICD-10-CM | POA: Diagnosis not present

## 2019-10-14 DIAGNOSIS — F333 Major depressive disorder, recurrent, severe with psychotic symptoms: Secondary | ICD-10-CM | POA: Diagnosis not present

## 2019-10-14 DIAGNOSIS — F419 Anxiety disorder, unspecified: Secondary | ICD-10-CM | POA: Diagnosis not present

## 2019-10-15 ENCOUNTER — Telehealth: Payer: Self-pay | Admitting: Physical Medicine and Rehabilitation

## 2019-10-15 NOTE — Telephone Encounter (Signed)
Pt's husband called stating the pt is in a lot of pain in her back and would like to be seen as soon as possible; today if the schedule will allow it.  [phone]

## 2019-10-15 NOTE — Telephone Encounter (Signed)
Bilateral L4-5 facets 01/22/19. Patient states that pain is in back and both hips because she sat too long at the computer yesterday. Ok to repeat last? I advised that we probably cannot get her in this week but that I will get her in as soon as I can. She asks if she can be worked in as an emergency this week.

## 2019-10-15 NOTE — Telephone Encounter (Signed)
Scheduled for 8/5 at 1530 with driver.

## 2019-10-15 NOTE — Telephone Encounter (Signed)
You can talk to me but might be wise to work in

## 2019-10-17 ENCOUNTER — Ambulatory Visit (INDEPENDENT_AMBULATORY_CARE_PROVIDER_SITE_OTHER): Payer: Medicare PPO

## 2019-10-17 ENCOUNTER — Ambulatory Visit: Payer: Self-pay

## 2019-10-17 ENCOUNTER — Encounter: Payer: Self-pay | Admitting: Physical Medicine and Rehabilitation

## 2019-10-17 ENCOUNTER — Ambulatory Visit: Payer: Medicare PPO | Admitting: Physical Medicine and Rehabilitation

## 2019-10-17 ENCOUNTER — Ambulatory Visit: Payer: Medicare PPO | Admitting: Podiatry

## 2019-10-17 ENCOUNTER — Other Ambulatory Visit: Payer: Self-pay

## 2019-10-17 VITALS — BP 128/72 | HR 76

## 2019-10-17 DIAGNOSIS — M79671 Pain in right foot: Secondary | ICD-10-CM

## 2019-10-17 DIAGNOSIS — M19071 Primary osteoarthritis, right ankle and foot: Secondary | ICD-10-CM

## 2019-10-17 DIAGNOSIS — M47816 Spondylosis without myelopathy or radiculopathy, lumbar region: Secondary | ICD-10-CM

## 2019-10-17 DIAGNOSIS — Z8781 Personal history of (healed) traumatic fracture: Secondary | ICD-10-CM

## 2019-10-17 DIAGNOSIS — M79675 Pain in left toe(s): Secondary | ICD-10-CM | POA: Diagnosis not present

## 2019-10-17 DIAGNOSIS — M1990 Unspecified osteoarthritis, unspecified site: Secondary | ICD-10-CM

## 2019-10-17 DIAGNOSIS — M19079 Primary osteoarthritis, unspecified ankle and foot: Secondary | ICD-10-CM | POA: Diagnosis not present

## 2019-10-17 DIAGNOSIS — G894 Chronic pain syndrome: Secondary | ICD-10-CM

## 2019-10-17 DIAGNOSIS — B351 Tinea unguium: Secondary | ICD-10-CM

## 2019-10-17 DIAGNOSIS — F1190 Opioid use, unspecified, uncomplicated: Secondary | ICD-10-CM

## 2019-10-17 DIAGNOSIS — M79674 Pain in right toe(s): Secondary | ICD-10-CM

## 2019-10-17 DIAGNOSIS — E119 Type 2 diabetes mellitus without complications: Secondary | ICD-10-CM

## 2019-10-17 MED ORDER — METHYLPREDNISOLONE ACETATE 80 MG/ML IJ SUSP
40.0000 mg | Freq: Once | INTRAMUSCULAR | Status: AC
  Administered 2019-10-17: 40 mg

## 2019-10-17 NOTE — Progress Notes (Signed)
Pt states lower back pain. Pt states moving around around. Pt states trying not to move makes the better.   Numeric Pain Rating Scale and Functional Assessment Average Pain 3   In the last MONTH (on 0-10 scale) has pain interfered with the following?  1. General activity like being  able to carry out your everyday physical activities such as walking, climbing stairs, carrying groceries, or moving a chair?  Rating(7)   +Driver, -BT, -Dye Allergies.

## 2019-10-18 NOTE — Progress Notes (Signed)
Linda Lucas - 74 y.o. female MRN [id number]  Date of birth: 08/28/1945  Office Visit Note: Visit Date: 10/17/2019 PCP: Merlene Laughter, MD Referred by: Merlene Laughter, MD  Subjective: Chief Complaint  Patient presents with  . Lower Back - Pain   HPI: Linda Lucas is a 74 y.o. female who comes in today For planned repeat bilateral L4-5 facet joint blocks. Patient's history is well-known to [place] we have several notes in the chart with her full history. Her case is complicated by major depression as well as fibromyalgia and chronic pain syndrome. She has multiple allergies including iodine allergy although we've been able to use a small amount with injections. The last time I saw the patient was in November of last year and completed bilateral L4-5 facet joint blocks and she said she had very good relief of her back pain was quite pleased for many months. She reports worsening over the last 3 months progressively just to the point where she felt like she needed a injection. We have discussed ablations before. She has had physical therapy and in fact has been in physical therapy recently but more for cervicalgia. In the past she has been followed by Dr. Norlene Campbell in our office and outside we initially saw her. She now follows with a Dr. Dewaine Oats at [facility] for her orthopedic care. She reports axial low back pain no radicular pain. She has had radicular pain in the past. Last CT scan and last MRI done a while ago but the CT scan is more recent shows lumbar spondylosis at L4-5 with moderate narrowing without high-grade nerve compression. She has had no focal weakness no specific injury no red flag complaints. This back pain is despite being on chronic long-acting hydromorphone with short acting hydromorphone for breakthrough. This is managed by another physician.  ROS Otherwise per HPI.  Assessment & Plan: Visit Diagnoses:  1. Spondylosis without myelopathy or radiculopathy, lumbar region   2.  Chronic pain syndrome   3. Opioid use, unspecified, uncomplicated     Plan: No additional findings.   Meds & Orders:  Meds ordered this encounter  Medications  . methylPREDNISolone acetate (DEPO-MEDROL) injection 40 mg    Orders Placed This Encounter  Procedures  . Facet Injection  . XR C-ARM NO REPORT    Follow-up: Return if symptoms worsen or fail to improve.   Procedures: No procedures performed  Lumbar Facet Joint Intra-Articular Injection(s) with Fluoroscopic Guidance  Patient: [name]      Date of Birth: 10/27/1945 MRN: [id number] PCP: Merlene Laughter, MD      Visit Date: 10/17/2019   Universal Protocol:    Date/Time: 10/17/2019  Consent Given By: the patient  Position: PRONE   Additional Comments: Vital signs were monitored before and after the procedure. Patient was prepped and draped in the usual sterile fashion. The correct patient, procedure, and site was verified.   Injection Procedure Details:  Procedure Site One Meds Administered:  Meds ordered this encounter  Medications  . methylPREDNISolone acetate (DEPO-MEDROL) injection 40 mg     Laterality: Bilateral  Location/Site:  L4-L5  Needle size: 22 guage  Needle type: Spinal  Needle Placement: Articular  Findings:  -Comments: Excellent flow of contrast producing a partial arthrogram.  Procedure Details: The fluoroscope beam is vertically oriented in AP, and the inferior recess is visualized beneath the lower pole of the inferior apophyseal process, which represents the target point for needle insertion. When direct visualization is difficult the  target point is located at the medial projection of the vertebral pedicle. The region overlying each aforementioned target is locally anesthetized with a 1 to 2 ml. volume of 1% Lidocaine without Epinephrine.   The spinal needle was inserted into each of the above mentioned facet joints using biplanar fluoroscopic guidance. A 0.25 to 0.5 ml.  volume of Isovue-250 was injected and a partial facet joint arthrogram was obtained. A single spot film was obtained of the resulting arthrogram.    One to 1.25 ml of the steroid/anesthetic solution was then injected into each of the facet joints noted above.   Additional Comments:  The patient tolerated the procedure well Dressing: 2 x 2 sterile gauze and Band-Aid    Post-procedure details: Patient was observed during the procedure. Post-procedure instructions were reviewed.  Patient left the clinic in stable condition.     Clinical History: CT LUMBAR SPINE WITHOUT CONTRAST  TECHNIQUE: Multidetector CT imaging of the lumbar spine was performed without intravenous contrast administration. Multiplanar CT image reconstructions were also generated.  COMPARISON: Lumbar spine MRI 12/22/2007  FINDINGS: Segmentation: 5 lumbar type vertebrae.  Alignment: Chronic trace retrolisthesis of L1 on L2, L2 on L3, and L3 on L4. Trace anterolisthesis of L4 on L5. Slight left convex lumbar curvature.  Vertebrae: No acute fracture or destructive osseous process. Progressive, severe disc space narrowing at L5-S1 with prominent degenerative vertebral sclerosis. Asymmetric right-sided endplate sclerosis at L4-5. Vacuum disc from L3-4 to L5-S1.  Paraspinal and other soft tissues: Status post cholecystectomy. Nonobstructing 6 mm right lower pole renal calculus. Minimal abdominal aortic atherosclerosis.  Disc levels:  T12-L1: Mild disc bulging without stenosis.  L1-2: Chronic mild disc bulging asymmetric to the left without stenosis.  L2-3: Circumferential disc bulging, stable to slightly increased. Mild facet and ligamentum flavum hypertrophy. No stenosis.  L3-4: Increased circumferential disc bulging and mild-to-moderate facet and ligamentum flavum hypertrophy result in borderline to mild spinal stenosis without neural foraminal stenosis.  L4-5: Progressive disc and facet  degeneration. Circumferential disc bulging, ligamentum flavum hypertrophy, and severe facet arthrosis result in new moderate spinal stenosis, left greater than right lateral recess stenosis, and mild-to-moderate right and mild left neural foraminal stenosis.  L5-S1: Similar broad central disc protrusion without significant spinal stenosis. Disc bulging, endplate spurring, disc space height loss, and moderate facet hypertrophy result in progressive, moderate to severe bilateral neural foraminal stenosis. Potential bilateral L5 nerve root impingement.  IMPRESSION: 1. Advanced lumbar disc and facet degeneration, progressed from 2009. 2. New moderate spinal stenosis and mild-to-moderate neural foraminal stenosis at L4-5. 3. Progressive moderate to severe bilateral neural foraminal stenosis at L5-S1. 4. Borderline to mild spinal stenosis at L3-4. 5. Nonobstructing right nephrolithiasis. 6. Aortic Atherosclerosis (ICD10-I70.0).   Electronically Signed By: Sebastian AcheAllen Grady M.D. On: 07/07/2017 14:21    06/03/2017 IMAGING:  X-RAYS: AP and lateral view of the lumbar spine. These were taken and  reviewed in the office today.  INTERPRETATION: No acute findings. Diffuse degenerative arthritis  throughout.Interstitial catheter in place.  AP view of the pelvis.Taken and reviewed in the office today.  INTERPRETATION: No acute findings.Hip joint spaces are maintained and  congruent.   She reports that she has never smoked. She has never used smokeless tobacco.  Recent Labs    09/06/19 0632  HGBA1C 7.1*    Objective:  VS:  HT:    WT:   BMI:     BP:128/72  HR:76bpm  TEMP: ( )  RESP:  Physical Exam Constitutional:  General: She is not in acute distress.    Appearance: Normal appearance. She is not ill-appearing.  HENT:     Head: Normocephalic and atraumatic.     Right Ear: External ear normal.     Left Ear: External ear normal.  Eyes:     Extraocular Movements:  Extraocular movements intact.  Cardiovascular:     Rate and Rhythm: Normal rate.     Pulses: Normal pulses.  Musculoskeletal:     Right lower leg: No edema.     Left lower leg: No edema.     Comments: Patient has good distal strength with no pain over the greater trochanters.  No clonus or focal weakness. Patient somewhat slow to rise from a seated position to full extension.  There is concordant low back pain with facet loading and lumbar spine extension rotation.  There are no definitive trigger points but the patient is somewhat tender across the lower back and PSIS.  There is no pain with hip rotation.   Skin:    Findings: No erythema, lesion or rash.  Neurological:     General: No focal deficit present.     Mental Status: She is alert and oriented to person, place, and time.     Sensory: No sensory deficit.     Motor: No weakness or abnormal muscle tone.     Coordination: Coordination normal.  Psychiatric:        Mood and Affect: Mood normal.        Behavior: Behavior normal.     Ortho Exam  Imaging: No results found.  Past Medical/Family/Surgical/Social History: Medications & Allergies reviewed per EMR, new medications updated. Patient Active Problem List   Diagnosis Date Noted  . Auditory hallucination 10/21/2019  . Aspiration of foreign body into lung 09/06/2019  . Onychomycosis 03/21/2019  . Skin fissure 03/21/2019  . Acute infection of nasal sinus 03/15/2018  . Yeast cystitis 03/14/2018  . Decreased range of motion of finger of left hand 03/06/2017  . Anxiety 02/15/2017  . Dislocation, finger, interphalangeal joint 01/30/2017  . Pain in finger of left hand 01/27/2017  . Chronic kidney disease, stage III (moderate) 08/13/2015  . Primary open angle glaucoma of both eyes, severe stage 06/29/2015  . Bronchitis 04/03/2015  . Essential (primary) hypertension 04/03/2015  . Fibromyositis 04/03/2015  . Peptic esophagitis 04/03/2015  . Hearing loss of both ears  04/03/2015  . History of colonic polyps 04/03/2015  . Hypothyroidism 04/03/2015  . Incontinence of feces 04/03/2015  . Migraine without aura and without status migrainosus, not intractable 04/03/2015  . Pain in joint 04/03/2015  . Recurrent major depressive episodes, in full remission (HCC) 04/03/2015  . Disorder of rotator cuff 11/26/2014  . Other specified postprocedural states 11/26/2014  . Difficulty walking 06/19/2014  . Lipodystrophy 02/13/2014  . Abnormal gait 10/15/2013  . At low risk for fall 10/15/2013  . Dyspareunia 07/25/2011  . Dysuria 07/25/2011  . Urge incontinence 07/25/2011  . Urinary urgency 07/25/2011  . Overactive bladder   . Migraine   . Fibromyalgia   . Chronic fatigue syndrome   . Metatarsal fracture   . DM2 (diabetes mellitus, type 2) (HCC)   . Ankle fracture   . Osteoarthritis   . IBS (irritable bowel syndrome)   . Depression   . Chronic urethritis   . Degenerative disc disease, lumbar   . Hypercholesteremia   . Prolactin increased   . Major depression in partial remission (HCC) 01/04/2011  . Restless legs syndrome 01/04/2011  Past Medical History:  Diagnosis Date  . Ankle fracture    bimeolar fracture  . Anxiety   . Chronic urethritis    frequent uti's and or urethritis  . Degenerative disc disease, lumbar 2007   bulging disc  . Depression   . Diabetes mellitus 2004  . Fibromyalgia   . Glaucoma   . Hypercholesteremia   . IBS (irritable bowel syndrome)   . 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 Migraine   . Osteoarthritis   . Overactive bladder   . Prolactin increased 05/2002   NORMAL MRI   Family History  Problem Relation Age of Onset  . Other Daughter        needle stick at hospital died of AIDS and hepatiitis  . Diabetes Mother   . Hypertension Mother   . Heart  disease Mother   . Heart attack Mother   . Diabetes Father   . Heart disease Father   . Hypertension Father   . Rheumatic fever Father   . Diabetes Sister   . Osteoarthritis Sister   . Heart attack Sister   . Diabetes Brother   . Heart attack Brother   . Cancer Maternal Grandmother        [employer] [facility] [place]  . Other Son        paralyzed after motorcycle accident   Past Surgical History:  Procedure Laterality Date  . ABDOMINAL HYSTERECTOMY    . APPENDECTOMY  age 74  . BREAST BIOPSY     x 2  . BREAST EXCISIONAL BIOPSY Left 1994  . BREAST EXCISIONAL BIOPSY Left 1994  . BURCH PROCEDURE    . CARPECTOMY HAND     LEFT FOR KEINBACH'S DZ  . CATARACT EXTRACTION    . CHOLECYSTECTOMY OPEN     WITH CDE  . CYSTOSCOPY     X 4  . FRACTURE METARSAL    . FUSION OF DISCS     C 3-4 4-5 5-6 VERTBRAES IN NECK DUE TO DEGEN. DISC DZ  . FUSION OF LEFT WRIST WITH PLATE AND SCREWS  2009   CADAVER BONE GRAFT AND APPLICATION OF HUMAN GROWTH FACTORS  . HEMORROIDECTOMY  age 89  . HYSTEROSCOPY     D&C,HYSTEROSCOPY FOR PED FIBROID   . INTERSTIM IMPLANT PLACEMENT    . OPEN REDUCTION INTERNAL FIXATION (ORIF) PROXIMAL PHALANX Left 10/30/2018   Procedure: OPEN REDUCTION WITH FIXATION LEFT MIDDLE FINGER;  Surgeon: Cindee Salt, MD;  Location: [facility];  Service: Orthopedics;  Laterality: Left;  . ORIF RADIAL HEAD / NECK FRACTURE  2010   RIGHT RADIAL HED EXTENDING INTO JOINT SPACE WITH PLATE AND SCREWS  . PERCUTANEOUS PINNING Left 01/31/2017   Procedure: PERCUTANEOUS PINNING EXTREMITYLEFT SMALL FINGER;  Surgeon: Cindee Salt, MD;  Location: [facility];  Service: Orthopedics;  Laterality: Left;  . REPAIR EXTENSOR TENDON Left 10/30/2018   Procedure: EXTENSOR TENDON REPAIR;  Surgeon: Cindee Salt, MD;  Location: [facility];  Service: Orthopedics;  Laterality: Left;  . RIGHT ANKLE FRACTURE    . ROTATOR CUFF REPAIR    . TONSILLECTOMY AND ADENOIDECTOMY    . TUBAL LIGATION     . WOUND EXPLORATION Left 10/30/2018   Procedure: EXPLORATION PROXIMAL INTERPHALANGEAL JOINT LEFT MIDDLE FINGER;  Surgeon: Cindee Salt, MD;  Location: [facility];  Service: Orthopedics;  Laterality: Left;   Social History   Occupational History  . Not on file  Tobacco Use  . Smoking status: Never Smoker  . Smokeless tobacco: Never Used  Vaping Use  . Vaping Use: Never used  Substance and Sexual Activity  . Alcohol use: No    Alcohol/week: 0.0 standard drinks  . Drug use: No  . Sexual activity: Never    Birth control/protection: Surgical, Post-menopausal    Comment: 1st intercourse 74 yo--Fewer than 5 partners

## 2019-10-20 NOTE — Progress Notes (Signed)
Subjective: 74 year old female presents the office today for  thick, elongated toenails that she cannot trim herself and for calluses.  Denies any open sores and denies any redness or drainage or any swelling.  She also states that she has noticed her right great toe staying somewhat red and swollen at times but this is been a chronic issue.  She has had several falls and she has been doing physical therapy.  She was upset because they were soaking her feet and my name was attached to this visit for physical therapy.  I do not recall ordering any soaking for her feet.  Denies any systemic complaints such as fevers, chills, nausea, vomiting. No acute changes since last appointment, and no other complaints at this time.   Objective: AAO x3, NAD-presents with husband DP/PT pulses palpable bilaterally, CRT less than 3 seconds Minimal hyperkeratotic tissue present to the right heel.  No underlying ulceration drainage or signs of infection.  No other open lesions or preulcerative lesions. Nails are hypertrophic, dystrophic, brittle, discolored, elongated 10. No surrounding redness or drainage. Tenderness nails 1-5 bilaterally.  Hallux malleus present in the right hallux localized edema erythema but this is likely from a worse redness of her shoes as it seems to be cutting right in that area the dorsal IPJ.  There is no other areas of tenderness identified to the feet particular there is no pain to the other digits metatarsals. No pain with calf compression, swelling, warmth, erythema  Assessment: Preulcerative callus right heel; symptomatic onychomycosis; hallux malleus, IPJ arthritis  Plan: -All treatment options discussed with the patient including all alternatives, risks, complications.  -X-rays obtained reviewed.  Arthritic changes affecting the hallux IPJ.  Fractures noted to the first through fifth digits likely chronic. -Nails sharply debrided x10 without complications or bleeding.  She was to  stop the antifungal and she is been doing some eye months without any change in I am in agreement to this. -Continue moisturizer for the heels -Discussed fractures on x-ray but this is chronic and from multiple falls.  Discussed wearing better shoes.  Also the issue this was causing irritation of the hallux IPJ. -Patient encouraged to call the office with any questions, concerns, change in symptoms.   Vivi Barrack DPM

## 2019-10-21 ENCOUNTER — Ambulatory Visit: Payer: Medicare PPO | Admitting: Neurology

## 2019-10-21 ENCOUNTER — Encounter: Payer: Self-pay | Admitting: Neurology

## 2019-10-21 VITALS — BP 162/78 | HR 81 | Ht 61.0 in | Wt 161.0 lb

## 2019-10-21 DIAGNOSIS — R440 Auditory hallucinations: Secondary | ICD-10-CM

## 2019-10-21 NOTE — Progress Notes (Signed)
SLEEP MEDICINE CLINIC    Provider:  Melvyn Novas, MD  Primary Care Physician:  Merlene Laughter, MD [address] [place] [address]     Referring Provider:  Berneice Gandy, MD at [facility].          Chief Complaint according to patient   Patient presents with:    . New Patient (Initial Visit)      patient see in [place] for chronic migraines, and has other pain issues/ cervicalgia - not to be followed here ( back pain, foot pain/ cervicalgia)   - she is followed by him and ortho.  Has a psychiatrist for major depression and anxiety-.   Here to evaluate for insomnia, and hypnopompic hallucinations.        HISTORY OF PRESENT ILLNESS:  Linda Lucas is a 74 y.o. year old Caucasian female patient and seen here on 10/21/2019 - the referral was made by [facility].   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 Berneice Gandy on 08-19-2019.  His visit was a telemedicine visit and the patient has not seen her headache specialist in person.  He stated that the patient has insomnia due to her medical problems and that she has reported hallucinations coming out of the dream when waking out of sleep.  Sometimes she will have conversations with family members.  A list of her current medication was also added, Dr. Welton Flakes takes the patient the diagnosis of insomnia due to medical condition,  due to chronic intractable pain.  restless legs have been present since teenage. Marland Kitchen Physical therapy was strongly encouraged and she is locally already established with a physical medicine group.  She continues to use triptans, for chronic body pain she has been on hydromorphone.  The patient has been started on Belsomra as well as zolpidem the patient's sleep quality can significantly contribute to increased headache  frequency he mentioned.  He also prescribed ropinirole for restless leg syndrome along with gabapentin and suggested to start on Methylfolate.  The patient presents today for the sleep clinic and has by my nurse been evaluated with a Montreal cognitive assessment.  She scored 25 out of 30 points.   I have the pleasure of seeing Linda Lucas today, a right -handed White or Caucasian female with a possible sleep disorder.  She   has a past medical history of Ankle fracture, Anxiety, Chronic urethritis, Degenerative disc disease, lumbar (2007), Depression, Diabetes mellitus (2004), Fibromyalgia, Glaucoma, Hypercholesteremia, IBS (irritable bowel syndrome), Metatarsal fracture, Migraine, Osteoarthritis, Overactive bladder, and Prolactin increased (05/2002).   The patient had the first sleep study in the year 2005, at [facility]-   Sleep relevant medical history: She reports difficulties falling asleep and staying asleep, sleep aids have improved this - prescriber is Dr Park Breed.     Social history:  Patient is retired from Runner, broadcasting/film/video  and lives in a household with spouse-  Pone living child one passed. The patient currently is not working. Pets are not present. Tobacco use- never .  ETOH use - never ,  Caffeine intake in form of Coffee( 1-2 cups a day ) Soda( decaffeinated - diet coke at home)  Tea ( /) or energy drinks. Regular exercise- none . Hobbies : reading.  Sleep habits are as follows: The patient's dinner time is between 6 PM. The patient goes to bed at 11.30  PM and takes 4 sleep aids (!). She continues to sleep for 6-9 hours, wakes for 1 bathroom break, the first time at 3 AM.   The preferred sleep position is supine , with the support of 1-2  pillows.   Dreams are reportedly frequent/vivid- the hypnopompic events have been frequent for a period of 2-3 month, she is now hearing the neighbors talking and believes they are seeing her, watching her in the shower.  She hears their talking. She  reports these audio tory hallucinations are not just a night, also in daytime.    8 AM is the usual rise time. The patient wakes up spontaneously.   She reports not feeling refreshed or restored in AM, with symptoms such as dry mouth. Naps are not taken.     Review of Systems: Out of a complete 14 system review, the patient complains of only the following symptoms, and all other reviewed systems are negative.:  Auditory hallucinations - and not sleep related - this is a psychiatric issue. A lot of times she wakes up in Am and hears the voices of [place].  The neighbors are no well known to the [place].  She has no visual hallucinations   her cognitive issues are slow progressing, and likely related to psychiatric illness as well.   She is not concerned about desorientation or getting lost.     How likely are you to doze in the following situations: 0 = not likely, 1 = slight chance, 2 = moderate chance, 3 = high chance   Sitting and Reading? Watching Television? Sitting inactive in a public place (theater or meeting)? As a passenger in a car for an hour without a break? Lying down in the afternoon when circumstances permit? Sitting and talking to someone? Sitting quietly after lunch without alcohol? In a car, while stopped for a few minutes in traffic?   Total = n/a / 24 points   FSS endorsed at n/a / 63 points.   Social History   Socioeconomic History  . Marital status: Married    Spouse name: Not on file  . Number of children: Not on file  . Years of education: Not on file  . Highest education level: Not on file  Occupational History  . Not on file  Tobacco Use  . Smoking status: Never Smoker  . Smokeless tobacco: Never Used  Vaping Use  . Vaping Use: Never used  Substance and Sexual Activity  . Alcohol use: No    Alcohol/week: 0.0 standard drinks  . Drug use: No  . Sexual activity: Never    Birth control/protection: Surgical, Post-menopausal    Comment: 1st  intercourse 74 yo--Fewer than 5 partners  Other Topics Concern  . Not on file  Social History Narrative  . Not on file   Social Determinants of Health   Financial Resource Strain:   . Difficulty of Paying Living Expenses:   Food Insecurity:   . Worried About Programme researcher, broadcasting/film/video in the Last Year:   . Barista in the Last Year:   Transportation Needs:   . Freight forwarder (Medical):   Marland Kitchen Lack of Transportation (Non-Medical):   Physical Activity:   . Days of Exercise per Week:   . Minutes of Exercise per Session:   Stress:   . Feeling of Stress :   Social Connections:   .  Frequency of Communication with Friends and Family:   . Frequency of Social Gatherings with Friends and Family:   . Attends Religious Services:   . Active Member of Clubs or Organizations:   . Attends Banker Meetings:   Marland Kitchen Marital Status:     Family History  Problem Relation Age of Onset  . Other Daughter        needle stick at hospital died of AIDS and hepatiitis  . Diabetes Mother   . Hypertension Mother   . Heart disease Mother   . Heart attack Mother   . Diabetes Father   . Heart disease Father   . Hypertension Father   . Rheumatic fever Father   . Diabetes Sister   . Osteoarthritis Sister   . Heart attack Sister   . Diabetes Brother   . Heart attack Brother   . Cancer Maternal Grandmother        OVARIAN OR [place]  . Other Son        paralyzed after motorcycle accident    Past Medical History:  Diagnosis Date  . Ankle fracture    bimeolar fracture  . Anxiety   . Chronic urethritis    frequent uti's and or urethritis  . Degenerative disc disease, lumbar 2007   bulging disc  . Depression   . Diabetes mellitus 2004  . Fibromyalgia   . Glaucoma   . Hypercholesteremia   . IBS (irritable bowel syndrome)   . 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 results in reflx sympath dystrophy  and fx of 3rd metatarsal after cast removed  . Migraine   . Osteoarthritis   . Overactive bladder   . Prolactin increased 05/2002   NORMAL MRI    Past Surgical History:  Procedure Laterality Date  . ABDOMINAL HYSTERECTOMY    . APPENDECTOMY  age 72  . BREAST BIOPSY     x 2  . BREAST EXCISIONAL BIOPSY Left 1994  . BREAST EXCISIONAL BIOPSY Left 1994  . BURCH PROCEDURE    . CARPECTOMY HAND     LEFT FOR KEINBACH'S DZ  . CATARACT EXTRACTION    . CHOLECYSTECTOMY OPEN     WITH CDE  . CYSTOSCOPY     X 4  . FRACTURE METARSAL    . FUSION OF DISCS     C 3-4 4-5 5-6 VERTBRAES IN NECK DUE TO DEGEN. DISC DZ  . FUSION OF LEFT WRIST WITH PLATE AND SCREWS  2009   CADAVER BONE GRAFT AND APPLICATION OF HUMAN GROWTH FACTORS  . HEMORROIDECTOMY  age 71  . HYSTEROSCOPY     D&C,HYSTEROSCOPY FOR PED FIBROID   . INTERSTIM IMPLANT PLACEMENT    . OPEN REDUCTION INTERNAL FIXATION (ORIF) PROXIMAL PHALANX Left 10/30/2018   Procedure: OPEN REDUCTION WITH FIXATION LEFT MIDDLE FINGER;  Surgeon: Cindee Salt, MD;  Location: [facility];  Service: Orthopedics;  Laterality: Left;  . ORIF RADIAL HEAD / NECK FRACTURE  2010   RIGHT RADIAL HED EXTENDING INTO JOINT SPACE WITH PLATE AND SCREWS  . PERCUTANEOUS PINNING Left 01/31/2017   Procedure: PERCUTANEOUS PINNING EXTREMITYLEFT SMALL FINGER;  Surgeon: Cindee Salt, MD;  Location: [facility];  Service: Orthopedics;  Laterality: Left;  . REPAIR EXTENSOR TENDON Left 10/30/2018   Procedure: EXTENSOR TENDON REPAIR;  Surgeon: Cindee Salt, MD;  Location: [facility];  Service: Orthopedics;  Laterality: Left;  . RIGHT  ANKLE FRACTURE    . ROTATOR CUFF REPAIR    . TONSILLECTOMY AND ADENOIDECTOMY    . TUBAL LIGATION    . WOUND EXPLORATION Left 10/30/2018   Procedure: EXPLORATION PROXIMAL INTERPHALANGEAL JOINT LEFT MIDDLE FINGER;  Surgeon: Cindee SaltKuzma, Gary, MD;  Location: [facility];  Service: Orthopedics;  Laterality: Left;       Current Outpatient Medications on File Prior to Visit  Medication Sig Dispense Refill  . acetaminophen (TYLENOL) 500 MG tablet Take 1,000 mg by mouth every 6 (six) hours as needed.    . ARIPiprazole (ABILIFY) 5 MG tablet Take 5-10 mg by mouth daily.    Marland Kitchen. aspirin 81 MG tablet Take 81 mg by mouth daily.     Marland Kitchen. atorvastatin (LIPITOR) 40 MG tablet Take 40 mg by mouth every evening.     . BELSOMRA 20 MG TABS Take 1 tablet by mouth at bedtime as needed.    . brimonidine-timolol (COMBIGAN) 0.2-0.5 % ophthalmic solution Place 1 drop into both eyes every 12 (twelve) hours.     . calcium citrate-vitamin D (CITRACAL+D) 315-200 MG-UNIT tablet Take 1 tablet by mouth daily.     . clonazePAM (KLONOPIN) 1 MG tablet Take 1-2 mg by mouth 2 (two) times daily as needed for anxiety. 2 mg at bedtime and 1 tab bid prn for anxiety    . diphenhydrAMINE (BENADRYL) 25 MG tablet Take 25 mg by mouth at bedtime.    . diphenoxylate-atropine (LOMOTIL) 2.5-0.025 MG tablet Take 1 tablet by mouth 4 (four) times daily as needed for diarrhea or loose stools.    . dorzolamide (TRUSOPT) 2 % ophthalmic solution Place 1 drop into both eyes 2 (two) times daily.     Marland Kitchen. estradiol (ESTRACE) 0.5 MG tablet Take 0.5 mg by mouth daily.    Marland Kitchen. glimepiride (AMARYL) 1 MG tablet Take 1 mg by mouth daily with breakfast.    . HYDROmorphone (DILAUDID) 4 MG tablet Take 4 mg by mouth every 4 (four) hours as needed for severe pain.    Marland Kitchen. HYDROmorphone HCl (EXALGO) 8 MG TB24 Take 8 mg by mouth daily.    Marland Kitchen. ibuprofen (ADVIL) 200 MG tablet Take 200-400 mg by mouth every 6 (six) hours as needed.    . latanoprost (XALATAN) 0.005 % ophthalmic solution Place 1 drop into both eyes at bedtime.     Marland Kitchen. levothyroxine (SYNTHROID) 100 MCG tablet Take 100 mcg by mouth daily.      Marland Kitchen. lisinopril (ZESTRIL) 10 MG tablet Take 10 mg by mouth daily.    Marland Kitchen. loratadine (CLARITIN) 10 MG tablet Take 10 mg by mouth daily.    Marland Kitchen. lubiprostone (AMITIZA) 8 MCG capsule Take 8 mcg by  mouth at bedtime.    . Multiple Vitamin (MULTIVITAMIN) tablet Take 1 tablet by mouth at bedtime.     . nadolol (CORGARD) 80 MG tablet Take 120 mg by mouth at bedtime.     . NON FORMULARY  apothecary  Antifungal (nail)-#1    . omeprazole (PRILOSEC) 20 MG capsule Take 20 mg by mouth 2 (two) times daily before a meal.    . oxybutynin (DITROPAN-XL) 10 MG 24 hr tablet Take 10 mg by mouth in the morning and at bedtime.    Marland Kitchen. OZEMPIC, 0.25 OR 0.5 MG/DOSE, 2 MG/1.5ML SOPN Inject 0.5 mg into the skin once a week.    . pilocarpine (PILOCAR) 1 % ophthalmic solution Place 1 drop into both eyes 3 (three) times daily.    . Probiotic Product (  PROBIOTIC ADVANCED) CAPS Take 1 capsule by mouth daily.    . promethazine (PHENERGAN) 25 MG tablet Take 25 mg by mouth every 6 (six) hours as needed for nausea or vomiting.     . repaglinide (PRANDIN) 2 MG tablet Take 4 mg by mouth 2 (two) times daily before a meal.     . rOPINIRole (REQUIP) 0.5 MG tablet Take 1.5 mg by mouth at bedtime.     . SUMAtriptan (IMITREX) 100 MG tablet Take 100 mg by mouth as needed for migraine.    . trimethoprim (TRIMPEX) 100 MG tablet Take 100 mg by mouth at bedtime.     Marland Kitchen venlafaxine (EFFEXOR) 100 MG tablet Take 150-200 mg by mouth 2 (two) times daily. 200 mg in morning and 150 mg at bedtime     No current facility-administered medications on file prior to visit.    Allergies  Allergen Reactions  . Empagliflozin Other (See Comments)    Pt stated, "This gave me a yeast infection"  . Adhesive [Tape] Other (See Comments)    Causes skin to peel and blister- ok with paper tape   . Oxycodone-Acetaminophen Nausea And Vomiting  . Oxymorphone Hcl Nausea And Vomiting  . Aspirin Swelling and Rash    "IN LARGE DOSES"- Body swells  . Ivp Dye [Iodinated Diagnostic Agents] Rash    Physical exam:  Today's Vitals   10/21/19 1408  BP: (!) 162/78  Pulse: 81  Weight: 161 lb (73 kg)  Height: 5\' 1"  (1.549 m)   Body mass index is  30.42 kg/m.   Wt Readings from Last 3 Encounters:  10/21/19 161 lb (73 kg)  10/30/18 186 lb 11.7 oz (84.7 kg)  10/28/18 181 lb (82.1 kg)     Ht Readings from Last 3 Encounters:  10/21/19 5\' 1"  (1.549 m)  10/30/18 5\' 2"  (1.575 m)  10/28/18 5\' 2"  (1.575 m)      General: The patient is awake, alert and appears not in acute distress. The patient is well groomed. Head: Normocephalic, atraumatic. Neck is supple.  Mallampati 2,  neck circumference: 15 inches . Nasal airflow  patent.  Retrognathia is seen.  Dental status:  Cardiovascular:  Regular rate and cardiac rhythm by pulse,  without distended neck veins. Respiratory: Lungs are clear to auscultation.  Skin:  Without evidence of ankle edema, she has a rash. She is scratching her skin on both arms, it has bleeding.   Trunk: The patient's posture is erect.   Neurologic exam : The patient is awake and alert, oriented to place and time.   Memory subjective described as intact.  Montreal Cognitive Assessment  10/21/2019  Visuospatial/ Executive (0/5) 4  Naming (0/3) 3  Attention: Read list of digits (0/2) 2  Attention: Read list of letters (0/1) 1  Attention: Serial 7 subtraction starting at 100 (0/3) 1  Language: Repeat phrase (0/2) 2  Language : Fluency (0/1) 0  Abstraction (0/2) 2  Delayed Recall (0/5) 4  Orientation (0/6) 6  Total 25    Attention span & concentration ability appears normal.  Speech is fluent,  without  dysarthria, dysphonia or aphasia.  Mood and affect are defensive - she is convinced she can hear the neighbors speaking about her.     Cranial nerves: no loss of smell or taste reported  Pupils are equal and briskly reactive to light. Funduscopic exam deferred.    Extraocular movements in vertical and horizontal planes were intact and without nystagmus. No Diplopia. Visual fields by  finger perimetry are intact. Hearing with bilateral hearing aids was intact to soft voice and finger rubbing.    Facial  sensation intact to fine touch.  Facial motor strength is symmetric and tongue and uvula move midline.  Frequent oral kinesia.  Neck ROM : rotation, tilt and flexion extension were normal for age and shoulder shrug was symmetrical.    Motor exam:  Symmetric bulk, tone and ROM.   Normal tone without cogwheeling, symmetrically reduced  grip strength . Sensory:  Fine touch, pinprick and vibration were normal.  Proprioception tested in the upper extremities was normal. Coordination:  Rapid alternating movements in the fingers/hands were of normal speed.  The Finger-to-nose maneuver was intact without evidence of ataxia, dysmetria or tremor.  Gait and station: Patient could rise assisted from a seated position, walked with an assistive device.  Stance is of normal width/ base with an everted right foot and the patient turned with 4 steps.  Toe and heel walk were deferred.   Deep tendon reflexes: in the upper and lower extremities are symmetric and intact.  Babinski response was deferred .       After spending a total time of 60  minutes face to face and additional time for physical and neurologic examination, review of laboratory studies,  personal review of imaging studies, reports and results of other testing and review of referral information / records as far as provided in visit, I have established the following assessments:  This patient has an extensive psychiatric - non neurological history. Her chronic pain medication use, sleep aids and other meds have let her into polypharmacy.  The patient reports now auditory hallucinations.  Her gait is impaired for years due to fractures and osteopenia, and she is already in P.Med. and R treatment. There is no new component to her gait- she has used a rollator for safety.     1)  With a MOCA score of 25/ 30 points there may be MCI present.  2)   There is no evidence of dementia that could correlate to auditory or visual hallucinations.  The  patient reports auditory hallucinations have not stopped after  Klonopin was d/c .  3) she continues on Requip for RLS.   My Plan is to proceed with:  1) refer to psychiatry - this exam did not show signs of Lewy body, of PD or dementia.  auditory hallucinations.  I would like to thank DR Berneice Gandy, MD  and [place], Hal, Md [address] [place],  [place] [address] for allowing me to meet with and to take care of this pleasant patient.   I  I do not plan to follow up either personally or through our NP.  CC: I will share my notes with PCP.   Electronically signed by: Melvyn Novas, MD 10/21/2019 2:26 PM  [facility] and [facility] Board certified by The [employer] of Sleep Medicine and Diplomate of the [employer] of Sleep Medicine. Board certified In Neurology through the ABPN, Fellow of the [employer] of Neurology. Medical Director of [facility].

## 2019-10-21 NOTE — Patient Instructions (Signed)
Schizoaffective Disorder Schizoaffective disorder (ScAD) is a mental illness. It causes symptoms that are a mixture of a psychotic disorder (schizophrenia) and a mood (affective) disorder. A psychotic disorder involves losing touch with reality. ScAD usually occurs in cycles. Periods of severe symptoms may be followed by periods of less severe symptoms or improvement. The illness affects men and women equally, but it usually appears at an earlier age (teenage or early adult years) in men. People who have family members with schizophrenia, bipolar disorder, or ScAD are at higher risk of developing ScAD. ScAD may interfere with personal relationships or normal daily activities. People with ScAD are at a higher risk for:  Job loss.  Social aloneness (isolation).  Health problems.  Anxiety.  Substance use disorders.  Suicide. What are the causes? The exact cause of this condition is not known. What increases the risk? The following factors may make you more likely to develop this condition:  Problems during your mother's pregnancy and after your birth, such as: ? Your mother having the flu (influenza) during the second semester of the pregnancy. ? Exposure to drugs, alcohol, illnesses, or other poisons (toxins) before birth. ? Low birth weight.  A brain infection or viral infection.  Problems with brain structure or function.  Having family members with bipolar disorder, ScAD, or schizophrenia.  Substance abuse.  Having been diagnosed with a mental health condition in the past.  Being a victim of neglect or long-term (chronic) abuse. What are the signs or symptoms? At any one time, people with ScAD may have psychotic symptoms only or have both psychotic and affective symptoms. Psychotic symptoms may include:  Hearing, seeing, or feeling things that are not there (hallucinations).  Having fixed, false beliefs (delusions). The delusions usually include being attacked, harassed,  or plotted against (paranoid delusions).  Speaking in a way that makes no sense to others (disorganized speech).  Confusing or odd behavior.  Loss of motivation for normal daily activities, such as self-care.  Withdrawal from social contacts (social isolation).  Lack of emotions. Affective symptoms may include:  Symptoms similar to major depression, such as: ? Depressed mood. ? Loss of interest in activities that are usually pleasurable (anhedonia). ? Sleeping more or less than normal. ? Feeling worthless or excessively guilty. ? Lack of energy or motivation. ? Trouble concentrating. ? Eating more or less than usual. ? Thinking a lot about death or suicide.  Symptoms similar to bipolar mania. Bipolar mania refers to periods of severe elation, irritability, and high energy that are experienced by people who have bipolar disorder. These symptoms may include: ? Abnormally elevated or irritable mood. ? Abnormally increased energy or activity. ? More confidence than normal or feeling that you are able to do anything (grandiosity). ? Feeling rested after getting less sleep than normal. ? Being easily distracted. ? Talking more than usual or feeling pressure to keep talking. ? Feeling that your thoughts are racing. ? Engaging in high-risk activities. How is this diagnosed? ScAD is diagnosed through an assessment by your health care provider. Your health care provider may refer you to a mental health specialist for evaluation. The mental health specialist:  Will observe and ask questions about your thoughts, behavior, mood, and ability to function in daily life.  May ask questions about your medical history and use of drugs, including prescription medicines. Certain medical conditions and substances can cause symptoms that resemble ScAD.  May do blood tests and imaging tests. There are two types of ScAD:  Depressive  ScAD. This type is diagnosed when you have only depressive  symptoms.  Bipolar ScAD. This type is diagnosed if your affective symptoms are only manic or are a mixture of manic and depressive. How is this treated? ScAD is usually a lifelong (chronic) illness that requires long-term treatment. Treatment may include:  Medicine. Different types of medicine are used to treat ScAD. The exact combination depends on the type and severity of your symptoms. ? Antipsychotic medicine may be used to control psychotic symptoms such as delusions, paranoia, and hallucinations. ? Mood stabilizers may be used to balance the highs and lows of bipolar manic mood swings. ? Antidepressant medicines may be used to treat depressive symptoms.  Counseling or talk therapy. Individual, group, or family counseling may be helpful in providing education, support, and guidance. Many people with ScAD also benefit from social skills and job skills (vocational) training. A combination of medicine and counseling is usually best for managing the disorder over time. A procedure in which electricity is applied to the brain through the scalp (electroconvulsive therapy) may be used to treat people with severe manic symptoms who do not respond to medicine and counseling. Follow these instructions at home:   Take over-the-counter and prescription medicines only as told by your health care provider. Check with your health care provider before starting new medicines.  Surround yourself with people who care about you and can help you manage your condition.  Keep stress under control. Stress may make symptoms worse.  Avoid alcohol and drugs. They can affect how medicine works and make symptoms worse.  Keep all follow-up visits as told by your health care provider and counselor. This is important. Contact a health care provider if:  You are not able to take your medicines as prescribed.  Your symptoms get worse. Get help right away if:  You feel out of control.  You or others notice  warning signs of suicide, such as: ? Increased use of drugs or alcohol ? Expressing feelings of not having a purpose in life or feeling trapped, guilty, anxious, agitated, or hopeless. ? Withdrawing from friends and family. ? Showing uncontrolled anger, recklessness, and dramatic mood changes. ? Talking about suicide or searching for methods. If you ever feel like you may hurt yourself or others, or have thoughts about taking your own life, get help right away. You can go to your nearest emergency department or call:  Your local emergency services (911 in the [place]  A suicide crisis helpline, such as the National Suicide Prevention Lifeline at 6803929351. This is open 24 hours a day. Summary  Schizoaffective disorder causes symptoms that are a mixture of a psychotic disorder and a mood disorder.  A combination of medicine and counseling is usually best for managing the disorder over time.  People who have schizoaffective disorder are at risk for suicide. Get help right away if you or someone else notices warning signs of suicide. This information is not intended to replace advice given to you by your health care provider. Make sure you discuss any questions you have with your health care provider. Document Revised: 02/10/2017 Document Reviewed: 12/11/2015 Elsevier Patient Education  2020 [employer].

## 2019-10-23 DIAGNOSIS — M797 Fibromyalgia: Secondary | ICD-10-CM | POA: Diagnosis not present

## 2019-10-23 DIAGNOSIS — E669 Obesity, unspecified: Secondary | ICD-10-CM | POA: Diagnosis not present

## 2019-10-23 DIAGNOSIS — N3010 Interstitial cystitis (chronic) without hematuria: Secondary | ICD-10-CM | POA: Diagnosis not present

## 2019-10-23 DIAGNOSIS — E1122 Type 2 diabetes mellitus with diabetic chronic kidney disease: Secondary | ICD-10-CM | POA: Diagnosis not present

## 2019-10-23 DIAGNOSIS — I129 Hypertensive chronic kidney disease with stage 1 through stage 4 chronic kidney disease, or unspecified chronic kidney disease: Secondary | ICD-10-CM | POA: Diagnosis not present

## 2019-10-23 DIAGNOSIS — N2581 Secondary hyperparathyroidism of renal origin: Secondary | ICD-10-CM | POA: Diagnosis not present

## 2019-10-23 DIAGNOSIS — N183 Chronic kidney disease, stage 3 unspecified: Secondary | ICD-10-CM | POA: Diagnosis not present

## 2019-10-30 DIAGNOSIS — Z79899 Other long term (current) drug therapy: Secondary | ICD-10-CM | POA: Diagnosis not present

## 2019-10-30 DIAGNOSIS — F333 Major depressive disorder, recurrent, severe with psychotic symptoms: Secondary | ICD-10-CM | POA: Diagnosis not present

## 2019-10-30 DIAGNOSIS — F419 Anxiety disorder, unspecified: Secondary | ICD-10-CM | POA: Diagnosis not present

## 2019-10-31 DIAGNOSIS — Z79899 Other long term (current) drug therapy: Secondary | ICD-10-CM | POA: Diagnosis not present

## 2019-11-14 ENCOUNTER — Other Ambulatory Visit: Payer: Self-pay | Admitting: Podiatry

## 2019-11-14 DIAGNOSIS — M1990 Unspecified osteoarthritis, unspecified site: Secondary | ICD-10-CM

## 2019-11-20 DIAGNOSIS — L719 Rosacea, unspecified: Secondary | ICD-10-CM | POA: Diagnosis not present

## 2019-11-24 NOTE — Procedures (Signed)
Lumbar Facet Joint Intra-Articular Injection(s) with Fluoroscopic Guidance  Patient: Linda Lucas      Date of Birth: Jul 18, 1945 MRN: [id number] PCP: Merlene Laughter, MD      Visit Date: 10/17/2019   Universal Protocol:    Date/Time: 10/17/2019  Consent Given By: the patient  Position: PRONE   Additional Comments: Vital signs were monitored before and after the procedure. Patient was prepped and draped in the usual sterile fashion. The correct patient, procedure, and site was verified.   Injection Procedure Details:  Procedure Site One Meds Administered:  Meds ordered this encounter  Medications  . methylPREDNISolone acetate (DEPO-MEDROL) injection 40 mg     Laterality: Bilateral  Location/Site:  L4-L5  Needle size: 22 guage  Needle type: Spinal  Needle Placement: Articular  Findings:  -Comments: Excellent flow of contrast producing a partial arthrogram.  Procedure Details: The fluoroscope beam is vertically oriented in AP, and the inferior recess is visualized beneath the lower pole of the inferior apophyseal process, which represents the target point for needle insertion. When direct visualization is difficult the target point is located at the medial projection of the vertebral pedicle. The region overlying each aforementioned target is locally anesthetized with a 1 to 2 ml. volume of 1% Lidocaine without Epinephrine.   The spinal needle was inserted into each of the above mentioned facet joints using biplanar fluoroscopic guidance. A 0.25 to 0.5 ml. volume of Isovue-250 was injected and a partial facet joint arthrogram was obtained. A single spot film was obtained of the resulting arthrogram.    One to 1.25 ml of the steroid/anesthetic solution was then injected into each of the facet joints noted above.   Additional Comments:  The patient tolerated the procedure well Dressing: 2 x 2 sterile gauze and Band-Aid    Post-procedure details: Patient was observed  during the procedure. Post-procedure instructions were reviewed.  Patient left the clinic in stable condition.

## 2019-12-03 DIAGNOSIS — M5412 Radiculopathy, cervical region: Secondary | ICD-10-CM | POA: Diagnosis not present

## 2019-12-03 DIAGNOSIS — Z23 Encounter for immunization: Secondary | ICD-10-CM | POA: Diagnosis not present

## 2019-12-03 DIAGNOSIS — I129 Hypertensive chronic kidney disease with stage 1 through stage 4 chronic kidney disease, or unspecified chronic kidney disease: Secondary | ICD-10-CM | POA: Diagnosis not present

## 2019-12-03 DIAGNOSIS — N1832 Chronic kidney disease, stage 3b: Secondary | ICD-10-CM | POA: Diagnosis not present

## 2019-12-04 ENCOUNTER — Other Ambulatory Visit: Payer: Self-pay | Admitting: Geriatric Medicine

## 2019-12-04 DIAGNOSIS — M5412 Radiculopathy, cervical region: Secondary | ICD-10-CM

## 2019-12-11 DIAGNOSIS — Z79899 Other long term (current) drug therapy: Secondary | ICD-10-CM | POA: Diagnosis not present

## 2019-12-11 DIAGNOSIS — F419 Anxiety disorder, unspecified: Secondary | ICD-10-CM | POA: Diagnosis not present

## 2019-12-11 DIAGNOSIS — F333 Major depressive disorder, recurrent, severe with psychotic symptoms: Secondary | ICD-10-CM | POA: Diagnosis not present

## 2019-12-13 ENCOUNTER — Ambulatory Visit
Admission: RE | Admit: 2019-12-13 | Discharge: 2019-12-13 | Disposition: A | Discharge status: Home / Self Care | Payer: Medicare PPO | Source: Ambulatory Visit | Attending: Geriatric Medicine | Admitting: Geriatric Medicine

## 2019-12-13 ENCOUNTER — Other Ambulatory Visit: Payer: Self-pay

## 2019-12-13 DIAGNOSIS — M47813 Spondylosis without myelopathy or radiculopathy, cervicothoracic region: Secondary | ICD-10-CM | POA: Diagnosis not present

## 2019-12-13 DIAGNOSIS — M50323 Other cervical disc degeneration at C6-C7 level: Secondary | ICD-10-CM | POA: Diagnosis not present

## 2019-12-13 DIAGNOSIS — M5412 Radiculopathy, cervical region: Secondary | ICD-10-CM

## 2019-12-13 DIAGNOSIS — M4322 Fusion of spine, cervical region: Secondary | ICD-10-CM | POA: Diagnosis not present

## 2019-12-13 DIAGNOSIS — G8929 Other chronic pain: Secondary | ICD-10-CM | POA: Diagnosis not present

## 2019-12-17 ENCOUNTER — Ambulatory Visit: Payer: Medicare PPO | Attending: Internal Medicine

## 2019-12-17 DIAGNOSIS — N1832 Chronic kidney disease, stage 3b: Secondary | ICD-10-CM | POA: Diagnosis not present

## 2019-12-17 DIAGNOSIS — Z23 Encounter for immunization: Secondary | ICD-10-CM

## 2019-12-17 NOTE — Progress Notes (Signed)
   Covid-19 Vaccination Clinic  Name:  Linda Lucas    MRN: [id number] DOB: Jul 29, 1945  12/17/2019  Linda Lucas was observed post Covid-19 immunization for 15 minutes without incident. She was provided with Vaccine Information Sheet and instruction to access the V-Safe system.   Linda Lucas was instructed to call 911 with any severe reactions post vaccine: Marland Kitchen Difficulty breathing  . Swelling of face and throat  . A fast heartbeat  . A bad rash all over body  . Dizziness and weakness

## 2019-12-31 DIAGNOSIS — E1122 Type 2 diabetes mellitus with diabetic chronic kidney disease: Secondary | ICD-10-CM | POA: Diagnosis not present

## 2019-12-31 DIAGNOSIS — Z79899 Other long term (current) drug therapy: Secondary | ICD-10-CM | POA: Diagnosis not present

## 2019-12-31 DIAGNOSIS — H401113 Primary open-angle glaucoma, right eye, severe stage: Secondary | ICD-10-CM | POA: Diagnosis not present

## 2019-12-31 DIAGNOSIS — Z961 Presence of intraocular lens: Secondary | ICD-10-CM | POA: Diagnosis not present

## 2019-12-31 DIAGNOSIS — E039 Hypothyroidism, unspecified: Secondary | ICD-10-CM | POA: Diagnosis not present

## 2019-12-31 DIAGNOSIS — N1832 Chronic kidney disease, stage 3b: Secondary | ICD-10-CM | POA: Diagnosis not present

## 2019-12-31 DIAGNOSIS — Z9889 Other specified postprocedural states: Secondary | ICD-10-CM | POA: Diagnosis not present

## 2019-12-31 DIAGNOSIS — H401122 Primary open-angle glaucoma, left eye, moderate stage: Secondary | ICD-10-CM | POA: Diagnosis not present

## 2020-01-16 ENCOUNTER — Ambulatory Visit: Payer: Medicare PPO | Admitting: Podiatry

## 2020-01-20 ENCOUNTER — Telehealth: Payer: Self-pay | Admitting: Orthopedic Surgery

## 2020-01-20 DIAGNOSIS — F419 Anxiety disorder, unspecified: Secondary | ICD-10-CM | POA: Diagnosis not present

## 2020-01-20 DIAGNOSIS — Z79899 Other long term (current) drug therapy: Secondary | ICD-10-CM | POA: Diagnosis not present

## 2020-01-20 DIAGNOSIS — F333 Major depressive disorder, recurrent, severe with psychotic symptoms: Secondary | ICD-10-CM | POA: Diagnosis not present

## 2020-01-20 NOTE — Telephone Encounter (Signed)
Pt called asking if she needed to remove nail polish before she sees Dr. Lajoyce Corners Thursday.

## 2020-01-21 NOTE — Telephone Encounter (Signed)
I called and lm on vm to advise that the pt does not have to remove her polish prior to her appt unless she is having a nail problem that needs to be looked at. To call with any other questions.

## 2020-01-23 ENCOUNTER — Ambulatory Visit: Payer: Medicare PPO | Admitting: Orthopedic Surgery

## 2020-01-23 ENCOUNTER — Encounter: Payer: Self-pay | Admitting: Orthopedic Surgery

## 2020-01-23 ENCOUNTER — Ambulatory Visit (INDEPENDENT_AMBULATORY_CARE_PROVIDER_SITE_OTHER): Payer: Medicare PPO

## 2020-01-23 VITALS — Ht 61.0 in | Wt 161.0 lb

## 2020-01-23 DIAGNOSIS — M79671 Pain in right foot: Secondary | ICD-10-CM | POA: Diagnosis not present

## 2020-01-23 NOTE — Progress Notes (Signed)
Office Visit Note   Patient: Linda Lucas           Date of Birth: 1945/04/24           MRN: [id number] Visit Date: 01/23/2020              Requested by: Merlene Laughter, MD [address] [place],  [place] [address] PCP: Merlene Laughter, MD  Chief Complaint  Patient presents with  . Right Foot - Pain      HPI: Patient is a 74 year old woman who states that maybe 2 years ago she may have jammed her foot onto something she does not remember any specific trauma she states she has been following podiatry for these 2 years and she states initially she did not know her toes were broken.  She complains of recently developing ulcers over the PIP joint of the second and third toes.  Assessment & Plan: Visit Diagnoses:  1. Pain in right foot     Plan: Patient was given a silicone pad to wear over the PIP joint of the second and third toes.  Recommended a extra-depth new balance sneaker or shoe with a stretch last.  Discussed that if she fails conservative treatment surgical intervention would be to fuse the great toe IP joint the second and third toe PIP joint with a Weil osteotomy of the second and third metatarsal.  Discussed that she would need to be off her foot for about a month.  She will call if she has further symptoms.  Follow-Up Instructions: Return if symptoms worsen or fail to improve.   Ortho Exam  Patient is alert, oriented, no adenopathy, well-dressed, normal affect, normal respiratory effort. Examination patient has a good dorsalis pedis pulse with good ankle and subtalar motion.  She has valgus alignment of the IP joint with valgus collapse through the fracture.  She has fixed clawing of the second and third toes with a small 5 mm ulcer over the dorsum of the PIP joints.  There is no exposed bone or tendon no cellulitis no sausage digit swelling.  A silo pad was applied to the second and third toes.  Imaging: XR Foot 2 Views Right  Result Date: 01/23/2020 2  view radiographs of the right foot shows an impacted sublux fracture of the IP joint of the great toe there is a nonunion of the fractures through the base of the second and third proximal phalanx at the MTP joint  No images are attached to the encounter.  Labs: Lab Results  Component Value Date   HGBA1C 7.1 (H) 09/06/2019   REPTSTATUS 11/04/2018 FINAL 10/30/2018   GRAMSTAIN  10/30/2018    FEW WBC PRESENT,BOTH PMN AND MONONUCLEAR NO ORGANISMS SEEN    CULT  10/30/2018    No growth aerobically or anaerobically. Performed at [facility], [address] [place], [place] [address]    LABORGA Multiple bacterial morphotypes present, none 06/12/2013   LABORGA predominant. Suggest appropriate recollection if  06/12/2013   LABORGA clinically indicated. 06/12/2013     No results found for: ALBUMIN, PREALBUMIN, LABURIC  No results found for: MG No results found for: VD25OH  No results found for: PREALBUMIN CBC EXTENDED Latest Ref Rng & Units 09/07/2019 09/05/2019 10/28/2018  WBC 4.0 - 10.5 K/uL 11.1(H) 14.2(H) 12.9(H)  RBC 3.87 - 5.11 MIL/uL 3.68(L) 4.21 3.59(L)  HGB 12.0 - 15.0 g/dL 11.6(L) 13.4 11.3(L)  HCT 36 - 46 % 35.0(L) 39.9 34.6(L)  PLT 150 -  400 K/uL 214 294 273  NEUTROABS 1.7 - 7.7 K/uL 6.5 7.5 7.2  LYMPHSABS 0.7 - 4.0 K/uL 2.9 4.7(H) 4.0     Body mass index is 30.42 kg/m.  Orders:  Orders Placed This Encounter  Procedures  . XR Foot 2 Views Right   No orders of the defined types were placed in this encounter.    Procedures: No procedures performed  Clinical Data: No additional findings.  ROS:  All other systems negative, except as noted in the HPI. Review of Systems  Objective: Vital Signs: Ht 5\' 1"  (1.549 m)   Wt 161 lb (73 kg)   BMI 30.42 kg/m   Specialty Comments:  No specialty comments available.  PMFS History: Patient Active Problem List   Diagnosis Date Noted  . Auditory hallucination 10/21/2019  . Aspiration of foreign body into  lung 09/06/2019  . Onychomycosis 03/21/2019  . Skin fissure 03/21/2019  . Acute infection of nasal sinus 03/15/2018  . Yeast cystitis 03/14/2018  . Decreased range of motion of finger of left hand 03/06/2017  . Anxiety 02/15/2017  . Dislocation, finger, interphalangeal joint 01/30/2017  . Pain in finger of left hand 01/27/2017  . Chronic kidney disease, stage III (moderate) (HCC) 08/13/2015  . Primary open angle glaucoma of both eyes, severe stage 06/29/2015  . Bronchitis 04/03/2015  . Essential (primary) hypertension 04/03/2015  . Fibromyositis 04/03/2015  . Peptic esophagitis 04/03/2015  . Hearing loss of both ears 04/03/2015  . History of colonic polyps 04/03/2015  . Hypothyroidism 04/03/2015  . Incontinence of feces 04/03/2015  . Migraine without aura and without status migrainosus, not intractable 04/03/2015  . Pain in joint 04/03/2015  . Recurrent major depressive episodes, in full remission (HCC) 04/03/2015  . Disorder of rotator cuff 11/26/2014  . Other specified postprocedural states 11/26/2014  . Difficulty walking 06/19/2014  . Lipodystrophy 02/13/2014  . Abnormal gait 10/15/2013  . At low risk for fall 10/15/2013  . Dyspareunia 07/25/2011  . Dysuria 07/25/2011  . Urge incontinence 07/25/2011  . Urinary urgency 07/25/2011  . Overactive bladder   . Migraine   . Fibromyalgia   . Chronic fatigue syndrome   . Metatarsal fracture   . DM2 (diabetes mellitus, type 2) (HCC)   . Ankle fracture   . Osteoarthritis   . IBS (irritable bowel syndrome)   . Depression   . Chronic urethritis   . Degenerative disc disease, lumbar   . Hypercholesteremia   . Prolactin increased   . Major depression in partial remission (HCC) 01/04/2011  . Restless legs syndrome 01/04/2011   Past Medical History:  Diagnosis Date  . Ankle fracture    bimeolar fracture  . Anxiety   . Chronic urethritis    frequent uti's and or urethritis  . Degenerative disc disease, lumbar 2007   bulging  disc  . Depression   . Diabetes mellitus 2004  . Fibromyalgia   . Glaucoma   . Hypercholesteremia   . IBS (irritable bowel syndrome)   . 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 Migraine   . Osteoarthritis   . Overactive bladder   . Prolactin increased 05/2002   NORMAL MRI    Family History  Problem Relation Age of Onset  . Other Daughter        needle stick at hospital died of AIDS  and hepatiitis  . Diabetes Mother   . Hypertension Mother   . Heart disease Mother   . Heart attack Mother   . Diabetes Father   . Heart disease Father   . Hypertension Father   . Rheumatic fever Father   . Diabetes Sister   . Osteoarthritis Sister   . Heart attack Sister   . Diabetes Brother   . Heart attack Brother   . Cancer Maternal Grandmother        [facility] [employer]  . Other Son        paralyzed after motorcycle accident    Past Surgical History:  Procedure Laterality Date  . ABDOMINAL HYSTERECTOMY    . APPENDECTOMY  age 55  . BREAST BIOPSY     x 2  . BREAST EXCISIONAL BIOPSY Left 1994  . BREAST EXCISIONAL BIOPSY Left 1994  . BURCH PROCEDURE    . CARPECTOMY HAND     LEFT FOR KEINBACH'S DZ  . CATARACT EXTRACTION    . CHOLECYSTECTOMY OPEN     WITH CDE  . CYSTOSCOPY     X 4  . FRACTURE METARSAL    . FUSION OF DISCS     C 3-4 4-5 5-6 VERTBRAES IN NECK DUE TO DEGEN. DISC DZ  . FUSION OF LEFT WRIST WITH PLATE AND SCREWS  2009   CADAVER BONE GRAFT AND APPLICATION OF HUMAN GROWTH FACTORS  . HEMORROIDECTOMY  age 69  . HYSTEROSCOPY     D&C,HYSTEROSCOPY FOR PED FIBROID   . INTERSTIM IMPLANT PLACEMENT    . OPEN REDUCTION INTERNAL FIXATION (ORIF) PROXIMAL PHALANX Left 10/30/2018   Procedure: OPEN REDUCTION WITH FIXATION LEFT MIDDLE FINGER;  Surgeon: Cindee Salt, MD;  Location: [facility];  Service: Orthopedics;   Laterality: Left;  . ORIF RADIAL HEAD / NECK FRACTURE  2010   RIGHT RADIAL HED EXTENDING INTO JOINT SPACE WITH PLATE AND SCREWS  . PERCUTANEOUS PINNING Left 01/31/2017   Procedure: PERCUTANEOUS PINNING EXTREMITYLEFT SMALL FINGER;  Surgeon: Cindee Salt, MD;  Location: [facility];  Service: Orthopedics;  Laterality: Left;  . REPAIR EXTENSOR TENDON Left 10/30/2018   Procedure: EXTENSOR TENDON REPAIR;  Surgeon: Cindee Salt, MD;  Location: [facility];  Service: Orthopedics;  Laterality: Left;  . RIGHT ANKLE FRACTURE    . ROTATOR CUFF REPAIR    . TONSILLECTOMY AND ADENOIDECTOMY    . TUBAL LIGATION    . WOUND EXPLORATION Left 10/30/2018   Procedure: EXPLORATION PROXIMAL INTERPHALANGEAL JOINT LEFT MIDDLE FINGER;  Surgeon: Cindee Salt, MD;  Location: [facility];  Service: Orthopedics;  Laterality: Left;   Social History   Occupational History  . Not on file  Tobacco Use  . Smoking status: Never Smoker  . Smokeless tobacco: Never Used  Vaping Use  . Vaping Use: Never used  Substance and Sexual Activity  . Alcohol use: No    Alcohol/week: 0.0 standard drinks  . Drug use: No  . Sexual activity: Never    Birth control/protection: Surgical, Post-menopausal    Comment: 1st intercourse 74 yo--Fewer than 5 partners

## 2020-02-19 DIAGNOSIS — F333 Major depressive disorder, recurrent, severe with psychotic symptoms: Secondary | ICD-10-CM | POA: Diagnosis not present

## 2020-02-19 DIAGNOSIS — F419 Anxiety disorder, unspecified: Secondary | ICD-10-CM | POA: Diagnosis not present

## 2020-03-04 DIAGNOSIS — Z79899 Other long term (current) drug therapy: Secondary | ICD-10-CM | POA: Diagnosis not present

## 2020-03-04 DIAGNOSIS — F333 Major depressive disorder, recurrent, severe with psychotic symptoms: Secondary | ICD-10-CM | POA: Diagnosis not present

## 2020-03-04 DIAGNOSIS — F419 Anxiety disorder, unspecified: Secondary | ICD-10-CM | POA: Diagnosis not present

## 2020-04-01 DIAGNOSIS — H401113 Primary open-angle glaucoma, right eye, severe stage: Secondary | ICD-10-CM | POA: Diagnosis not present

## 2020-04-01 DIAGNOSIS — H26491 Other secondary cataract, right eye: Secondary | ICD-10-CM | POA: Diagnosis not present

## 2020-04-01 DIAGNOSIS — Z961 Presence of intraocular lens: Secondary | ICD-10-CM | POA: Diagnosis not present

## 2020-04-01 DIAGNOSIS — Z9889 Other specified postprocedural states: Secondary | ICD-10-CM | POA: Diagnosis not present

## 2020-04-01 DIAGNOSIS — H401122 Primary open-angle glaucoma, left eye, moderate stage: Secondary | ICD-10-CM | POA: Diagnosis not present

## 2020-04-13 DIAGNOSIS — Z79899 Other long term (current) drug therapy: Secondary | ICD-10-CM | POA: Diagnosis not present

## 2020-04-13 DIAGNOSIS — F419 Anxiety disorder, unspecified: Secondary | ICD-10-CM | POA: Diagnosis not present

## 2020-04-13 DIAGNOSIS — F333 Major depressive disorder, recurrent, severe with psychotic symptoms: Secondary | ICD-10-CM | POA: Diagnosis not present

## 2020-04-14 DIAGNOSIS — E1122 Type 2 diabetes mellitus with diabetic chronic kidney disease: Secondary | ICD-10-CM | POA: Diagnosis not present

## 2020-04-14 DIAGNOSIS — N1832 Chronic kidney disease, stage 3b: Secondary | ICD-10-CM | POA: Diagnosis not present

## 2020-04-14 DIAGNOSIS — Z79899 Other long term (current) drug therapy: Secondary | ICD-10-CM | POA: Diagnosis not present

## 2020-04-14 DIAGNOSIS — E039 Hypothyroidism, unspecified: Secondary | ICD-10-CM | POA: Diagnosis not present

## 2020-04-22 DIAGNOSIS — H524 Presbyopia: Secondary | ICD-10-CM | POA: Diagnosis not present

## 2020-04-22 DIAGNOSIS — H52209 Unspecified astigmatism, unspecified eye: Secondary | ICD-10-CM | POA: Diagnosis not present

## 2020-04-22 DIAGNOSIS — H5213 Myopia, bilateral: Secondary | ICD-10-CM | POA: Diagnosis not present

## 2020-04-27 DIAGNOSIS — M797 Fibromyalgia: Secondary | ICD-10-CM | POA: Diagnosis not present

## 2020-04-27 DIAGNOSIS — N183 Chronic kidney disease, stage 3 unspecified: Secondary | ICD-10-CM | POA: Diagnosis not present

## 2020-04-27 DIAGNOSIS — N390 Urinary tract infection, site not specified: Secondary | ICD-10-CM | POA: Diagnosis not present

## 2020-04-27 DIAGNOSIS — E669 Obesity, unspecified: Secondary | ICD-10-CM | POA: Diagnosis not present

## 2020-04-27 DIAGNOSIS — N2581 Secondary hyperparathyroidism of renal origin: Secondary | ICD-10-CM | POA: Diagnosis not present

## 2020-04-27 DIAGNOSIS — N3010 Interstitial cystitis (chronic) without hematuria: Secondary | ICD-10-CM | POA: Diagnosis not present

## 2020-04-27 DIAGNOSIS — E1122 Type 2 diabetes mellitus with diabetic chronic kidney disease: Secondary | ICD-10-CM | POA: Diagnosis not present

## 2020-04-27 DIAGNOSIS — I129 Hypertensive chronic kidney disease with stage 1 through stage 4 chronic kidney disease, or unspecified chronic kidney disease: Secondary | ICD-10-CM | POA: Diagnosis not present

## 2020-04-30 ENCOUNTER — Ambulatory Visit: Payer: Medicare PPO | Admitting: Obstetrics & Gynecology

## 2020-04-30 ENCOUNTER — Ambulatory Visit: Payer: Medicare PPO | Admitting: Orthopedic Surgery

## 2020-04-30 ENCOUNTER — Encounter: Payer: Self-pay | Admitting: Orthopedic Surgery

## 2020-04-30 DIAGNOSIS — M205X1 Other deformities of toe(s) (acquired), right foot: Secondary | ICD-10-CM

## 2020-04-30 NOTE — Progress Notes (Signed)
Office Visit Note   Patient: Linda Lucas           Date of Birth: [birth year]           MRN: [id number] Visit Date: 04/30/74              Requested by: Merlene Laughter, MD [address] [place],  [place] [address] PCP: Merlene Laughter, MD  Chief Complaint  Patient presents with  . Right Foot - Follow-up      HPI: Patient is seen for evaluation for clawing of her toes of her right foot with ulcers over the dorsum of the great toe second toe and third toe.  Patient states her foot is getting worse.  Assessment & Plan: Visit Diagnoses:  1. Claw toe, acquired, right     Plan: The orthotic was cut out from her shoe so she would have more room from the metatarsal heads distally her shoes seem to have a good fit.  If this is insufficient to relieve pressure from the toes her husband was given instruction to cut a slit across the dorsum of the shoe over the metatarsal heads to further relieve pressure.  Discussed risks of claw toe surgery with potential of difficulty with wound healing.  Follow-Up Instructions: Return if symptoms worsen or fail to improve.   Ortho Exam  Patient is alert, oriented, no adenopathy, well-dressed, normal affect, normal respiratory effort. Examination patient has a good dorsalis pedis and posterior tibial pulse she has new balance sneakers she has fixed clawing of the great toe second toe and third toe with pressure ulcers over the dorsum of the PIP joint and IP joint of the toes.  The toes have a fixed claw deformity.  Patient has a thick orthotic in her shoe and this was trimmed to unload pressure from the claw toes.  They will continue with Band-Aids and Neosporin for dressing changes.  Imaging: No results found. No images are attached to the encounter.  Labs: Lab Results  Component Value Date   HGBA1C 7.1 (H) 09/06/2019   REPTSTATUS 11/04/2018 FINAL 10/30/2018   GRAMSTAIN  10/30/2018    FEW WBC PRESENT,BOTH PMN AND MONONUCLEAR NO  ORGANISMS SEEN    CULT  10/30/2018    No growth aerobically or anaerobically. Performed at [facility], [address], [place], [place] [address]    LABORGA Multiple bacterial morphotypes present, none 06/12/2013   LABORGA predominant. Suggest appropriate recollection if  06/12/2013   LABORGA clinically indicated. 06/12/2013     No results found for: ALBUMIN, PREALBUMIN, LABURIC  No results found for: MG No results found for: VD25OH  No results found for: PREALBUMIN CBC EXTENDED Latest Ref Rng & Units 09/07/2019 09/05/2019 10/28/2018  WBC 4.0 - 10.5 K/uL 11.1(H) 14.2(H) 12.9(H)  RBC 3.87 - 5.11 MIL/uL 3.68(L) 4.21 3.59(L)  HGB 12.0 - 15.0 g/dL 11.6(L) 13.4 11.3(L)  HCT 36.0 - 46.0 % 35.0(L) 39.9 34.6(L)  PLT 150 - 400 K/uL 214 294 273  NEUTROABS 1.7 - 7.7 K/uL 6.5 7.5 7.2  LYMPHSABS 0.7 - 4.0 K/uL 2.9 4.7(H) 4.0     There is no height or weight on file to calculate BMI.  Orders:  No orders of the defined types were placed in this encounter.  No orders of the defined types were placed in this encounter.    Procedures: No procedures performed  Clinical Data: No additional findings.  ROS:  All other systems negative, except as noted in the  HPI. Review of Systems  Objective: Vital Signs: There were no vitals taken for this visit.  Specialty Comments:  No specialty comments available.  PMFS History: Patient Active Problem List   Diagnosis Date Noted  . Auditory hallucination 10/21/2019  . Aspiration of foreign body into lung 09/06/2019  . Onychomycosis 03/21/2019  . Skin fissure 03/21/2019  . Acute infection of nasal sinus 03/15/2018  . Yeast cystitis 03/14/2018  . Decreased range of motion of finger of left hand 03/06/2017  . Anxiety 02/15/2017  . Dislocation, finger, interphalangeal joint 01/30/2017  . Pain in finger of left hand 01/27/2017  . Chronic kidney disease, stage III (moderate) (HCC) 08/13/2015  . Primary open angle glaucoma of both  eyes, severe stage 06/29/2015  . Bronchitis 04/03/2015  . Essential (primary) hypertension 04/03/2015  . Fibromyositis 04/03/2015  . Peptic esophagitis 04/03/2015  . Hearing loss of both ears 04/03/2015  . History of colonic polyps 04/03/2015  . Hypothyroidism 04/03/2015  . Incontinence of feces 04/03/2015  . Migraine without aura and without status migrainosus, not intractable 04/03/2015  . Pain in joint 04/03/2015  . Recurrent major depressive episodes, in full remission (HCC) 04/03/2015  . Disorder of rotator cuff 11/26/2014  . Other specified postprocedural states 11/26/2014  . Difficulty walking 06/19/2014  . Lipodystrophy 02/13/2014  . Abnormal gait 10/15/2013  . At low risk for fall 10/15/2013  . Dyspareunia 07/25/2011  . Dysuria 07/25/2011  . Urge incontinence 07/25/2011  . Urinary urgency 07/25/2011  . Overactive bladder   . Migraine   . Fibromyalgia   . Chronic fatigue syndrome   . Metatarsal fracture   . DM2 (diabetes mellitus, type 2) (HCC)   . Ankle fracture   . Osteoarthritis   . IBS (irritable bowel syndrome)   . Depression   . Chronic urethritis   . Degenerative disc disease, lumbar   . Hypercholesteremia   . Prolactin increased   . Major depression in partial remission (HCC) 01/04/2011  . Restless legs syndrome 01/04/2011   Past Medical History:  Diagnosis Date  . Ankle fracture    bimeolar fracture  . Anxiety   . Chronic urethritis    frequent uti's and or urethritis  . Degenerative disc disease, lumbar 2007   bulging disc  . Depression   . Diabetes mellitus 2004  . Fibromyalgia   . Glaucoma   . Hypercholesteremia   . IBS (irritable bowel syndrome)   . 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 Migraine   . Osteoarthritis   . Overactive bladder   . Prolactin increased 05/2002    NORMAL MRI    Family History  Problem Relation Age of Onset  . Other Daughter        needle stick at hospital died of AIDS and hepatiitis  . Diabetes Mother   . Hypertension Mother   . Heart disease Mother   . Heart attack Mother   . Diabetes Father   . Heart disease Father   . Hypertension Father   . Rheumatic fever Father   . Diabetes Sister   . Osteoarthritis Sister   . Heart attack Sister   . Diabetes Brother   . Heart attack Brother   . Cancer Maternal Grandmother        [facility] OR [facility]  . Other Son  paralyzed after motorcycle accident    Past Surgical History:  Procedure Laterality Date  . ABDOMINAL HYSTERECTOMY    . APPENDECTOMY  age 16  . BREAST BIOPSY     x 2  . BREAST EXCISIONAL BIOPSY Left 1994  . BREAST EXCISIONAL BIOPSY Left 1994  . BURCH PROCEDURE    . CARPECTOMY HAND     LEFT FOR KEINBACH'S DZ  . CATARACT EXTRACTION    . CHOLECYSTECTOMY OPEN     WITH CDE  . CYSTOSCOPY     X 4  . FRACTURE METARSAL    . FUSION OF DISCS     C 3-4 4-5 5-6 VERTBRAES IN NECK DUE TO DEGEN. DISC DZ  . FUSION OF LEFT WRIST WITH PLATE AND SCREWS  2009   CADAVER BONE GRAFT AND APPLICATION OF HUMAN GROWTH FACTORS  . HEMORROIDECTOMY  age 28  . HYSTEROSCOPY     D&C,HYSTEROSCOPY FOR PED FIBROID   . INTERSTIM IMPLANT PLACEMENT    . OPEN REDUCTION INTERNAL FIXATION (ORIF) PROXIMAL PHALANX Left 10/30/2018   Procedure: OPEN REDUCTION WITH FIXATION LEFT MIDDLE FINGER;  Surgeon: Cindee Salt, MD;  Location: [facility];  Service: Orthopedics;  Laterality: Left;  . ORIF RADIAL HEAD / NECK FRACTURE  2010   RIGHT RADIAL HED EXTENDING INTO JOINT SPACE WITH PLATE AND SCREWS  . PERCUTANEOUS PINNING Left 01/31/2017   Procedure: PERCUTANEOUS PINNING EXTREMITYLEFT SMALL FINGER;  Surgeon: Cindee Salt, MD;  Location: [facility];  Service: Orthopedics;  Laterality: Left;  . REPAIR EXTENSOR TENDON Left 10/30/2018   Procedure: EXTENSOR TENDON REPAIR;  Surgeon: Cindee Salt, MD;  Location: [facility];  Service: Orthopedics;  Laterality: Left;  . RIGHT ANKLE FRACTURE    . ROTATOR CUFF REPAIR    . TONSILLECTOMY AND ADENOIDECTOMY    . TUBAL LIGATION    . WOUND EXPLORATION Left 10/30/2018   Procedure: EXPLORATION PROXIMAL INTERPHALANGEAL JOINT LEFT MIDDLE FINGER;  Surgeon: Cindee Salt, MD;  Location: [facility];  Service: Orthopedics;  Laterality: Left;   Social History   Occupational History  . Not on file  Tobacco Use  . Smoking status: Never Smoker  . Smokeless tobacco: Never Used  Vaping Use  . Vaping Use: Never used  Substance and Sexual Activity  . Alcohol use: No    Alcohol/week: 0.0 standard drinks  . Drug use: No  . Sexual activity: Never    Birth control/protection: Surgical, Post-menopausal    Comment: 1st intercourse 75 yo--Fewer than 5 partners

## 2020-05-04 ENCOUNTER — Ambulatory Visit: Payer: Medicare HMO | Admitting: Obstetrics & Gynecology

## 2020-05-11 DIAGNOSIS — F251 Schizoaffective disorder, depressive type: Secondary | ICD-10-CM | POA: Diagnosis not present

## 2020-05-11 DIAGNOSIS — Z79899 Other long term (current) drug therapy: Secondary | ICD-10-CM | POA: Diagnosis not present

## 2020-05-11 DIAGNOSIS — F419 Anxiety disorder, unspecified: Secondary | ICD-10-CM | POA: Diagnosis not present

## 2020-05-25 DIAGNOSIS — N390 Urinary tract infection, site not specified: Secondary | ICD-10-CM | POA: Diagnosis not present

## 2020-06-03 DIAGNOSIS — Z79899 Other long term (current) drug therapy: Secondary | ICD-10-CM | POA: Diagnosis not present

## 2020-06-03 DIAGNOSIS — F251 Schizoaffective disorder, depressive type: Secondary | ICD-10-CM | POA: Diagnosis not present

## 2020-06-03 DIAGNOSIS — R4189 Other symptoms and signs involving cognitive functions and awareness: Secondary | ICD-10-CM | POA: Diagnosis not present

## 2020-06-03 DIAGNOSIS — F419 Anxiety disorder, unspecified: Secondary | ICD-10-CM | POA: Diagnosis not present

## 2020-06-15 DIAGNOSIS — I129 Hypertensive chronic kidney disease with stage 1 through stage 4 chronic kidney disease, or unspecified chronic kidney disease: Secondary | ICD-10-CM | POA: Diagnosis not present

## 2020-06-15 DIAGNOSIS — K9089 Other intestinal malabsorption: Secondary | ICD-10-CM | POA: Diagnosis not present

## 2020-06-15 DIAGNOSIS — E785 Hyperlipidemia, unspecified: Secondary | ICD-10-CM | POA: Diagnosis not present

## 2020-06-15 DIAGNOSIS — G2581 Restless legs syndrome: Secondary | ICD-10-CM | POA: Diagnosis not present

## 2020-06-15 DIAGNOSIS — Z79899 Other long term (current) drug therapy: Secondary | ICD-10-CM | POA: Diagnosis not present

## 2020-06-15 DIAGNOSIS — K219 Gastro-esophageal reflux disease without esophagitis: Secondary | ICD-10-CM | POA: Diagnosis not present

## 2020-06-15 DIAGNOSIS — E039 Hypothyroidism, unspecified: Secondary | ICD-10-CM | POA: Diagnosis not present

## 2020-06-15 DIAGNOSIS — Z0000 Encounter for general adult medical examination without abnormal findings: Secondary | ICD-10-CM | POA: Diagnosis not present

## 2020-06-15 DIAGNOSIS — E1122 Type 2 diabetes mellitus with diabetic chronic kidney disease: Secondary | ICD-10-CM | POA: Diagnosis not present

## 2020-06-15 DIAGNOSIS — Z1389 Encounter for screening for other disorder: Secondary | ICD-10-CM | POA: Diagnosis not present

## 2020-06-23 ENCOUNTER — Other Ambulatory Visit: Payer: Self-pay

## 2020-06-23 ENCOUNTER — Encounter: Payer: Self-pay | Admitting: Obstetrics & Gynecology

## 2020-06-23 ENCOUNTER — Ambulatory Visit: Payer: Medicare HMO | Admitting: Obstetrics & Gynecology

## 2020-06-23 VITALS — BP 120/70 | Ht 62.0 in | Wt 175.0 lb

## 2020-06-23 DIAGNOSIS — N952 Postmenopausal atrophic vaginitis: Secondary | ICD-10-CM | POA: Diagnosis not present

## 2020-06-23 DIAGNOSIS — Z780 Asymptomatic menopausal state: Secondary | ICD-10-CM

## 2020-06-23 DIAGNOSIS — Z01419 Encounter for gynecological examination (general) (routine) without abnormal findings: Secondary | ICD-10-CM

## 2020-06-23 DIAGNOSIS — E6609 Other obesity due to excess calories: Secondary | ICD-10-CM

## 2020-06-23 DIAGNOSIS — Z6832 Body mass index (BMI) 32.0-32.9, adult: Secondary | ICD-10-CM | POA: Diagnosis not present

## 2020-06-23 MED ORDER — ESTROGENS, CONJUGATED 0.625 MG/GM VA CREA: 1.0000 | TOPICAL_CREAM | VAGINAL | 4 refills | Status: AC

## 2020-06-23 NOTE — Progress Notes (Signed)
Linda Lucas Nov 16, 1945 [id number]   History:    75 y.o. G2P2L1  RP:  Established patient presenting for annual gyn exam   HPI: Status post TAH in the past. Postmenopausal/atrophic genital changes well on Premarin vaginal cream once to twice weekly.  No pelvic pain.  Pap normal in 2019.  No history of abnormal Pap smears previously.  Breasts normal.  BMI 32.01.  Not physically active.  Enjoys reading.  [place] 2019.  BD normal in 2018.  Health labs with Fam MD.  Past medical history,surgical history, family history and social history were all reviewed and documented in the EPIC chart.  Gynecologic History No LMP recorded. Patient has had a hysterectomy.  Obstetric History OB History  Gravida Para Term Preterm AB Living  2 2 2    0 1  SAB IAB Ectopic Multiple Live Births               # Outcome Date GA Lbr Len/2nd Weight Sex Delivery Anes PTL Lv  2 Term           1 Term              ROS: A ROS was performed and pertinent positives and negatives are included in the history.  GENERAL: No fevers or chills. HEENT: No change in vision, no earache, sore throat or sinus congestion. NECK: No pain or stiffness. CARDIOVASCULAR: No chest pain or pressure. No palpitations. PULMONARY: No shortness of breath, cough or wheeze. GASTROINTESTINAL: No abdominal pain, nausea, vomiting or diarrhea, melena or bright red blood per rectum. GENITOURINARY: No urinary frequency, urgency, hesitancy or dysuria. MUSCULOSKELETAL: No joint or muscle pain, no back pain, no recent trauma. DERMATOLOGIC: No rash, no itching, no lesions. ENDOCRINE: No polyuria, polydipsia, no heat or cold intolerance. No recent change in weight. HEMATOLOGICAL: No anemia or easy bruising or bleeding. NEUROLOGIC: No headache, seizures, numbness, tingling or weakness. PSYCHIATRIC: No depression, no loss of interest in normal activity or change in sleep pattern.     Exam:   BP 120/70   Ht 5\' 2"  (1.575 m)   Wt 175 lb (79.4 kg)   BMI  32.01 kg/m   Body mass index is 32.01 kg/m.  General appearance : Well developed well nourished female. No acute distress HEENT: Eyes: no retinal hemorrhage or exudates,  Neck supple, trachea midline, no carotid bruits, no thyroidmegaly Lungs: Clear to auscultation, no rhonchi or wheezes, or rib retractions  Heart: Regular rate and rhythm, no murmurs or gallops Breast:Examined in sitting and supine position were symmetrical in appearance, no palpable masses or tenderness,  no skin retraction, no nipple inversion, no nipple discharge, no skin discoloration, no axillary or supraclavicular lymphadenopathy Abdomen: no palpable masses or tenderness, no rebound or guarding Extremities: no edema or skin discoloration or tenderness  Pelvic: Vulva: Normal             Vagina: No gross lesions or discharge  Cervix/Uterus absent  Adnexa  Without masses or tenderness  Anus: Normal   Assessment/Plan:  75 y.o. female for annual exam   1. Well female exam with routine gynecological exam Gynecologic exam status post TAH and menopause.  No indication to repeat Pap test anymore.  Breast exam normal.  Screening mammogram was negative in June 2021.  Colonoscopy 2019.  Health labs with family physician.  2. Postmenopause Well on no systemic hormone replacement therapy.  Last bone density normal in 2018.  3. Postmenopausal atrophic vaginitis Well on Premarin cream.  No  contraindication to continue.  Prescription sent to pharmacy.  4. Class 1 obesity due to excess calories with serious comorbidity and body mass index (BMI) of 32.0 to 32.9 in adult Recommend a lower calorie/carb diet.  Aerobic activities 5 times a week and light weightlifting recommended every 2 days.  Other orders - conjugated estrogens (PREMARIN) vaginal cream; Place 1 Applicatorful vaginally 2 (two) times a week.  Genia Del MD, 1:43 PM 06/23/2020

## 2020-07-04 DIAGNOSIS — J029 Acute pharyngitis, unspecified: Secondary | ICD-10-CM | POA: Diagnosis not present

## 2020-07-06 DIAGNOSIS — F251 Schizoaffective disorder, depressive type: Secondary | ICD-10-CM | POA: Diagnosis not present

## 2020-07-06 DIAGNOSIS — F419 Anxiety disorder, unspecified: Secondary | ICD-10-CM | POA: Diagnosis not present

## 2020-07-06 DIAGNOSIS — R4189 Other symptoms and signs involving cognitive functions and awareness: Secondary | ICD-10-CM | POA: Diagnosis not present

## 2020-07-06 DIAGNOSIS — Z79899 Other long term (current) drug therapy: Secondary | ICD-10-CM | POA: Diagnosis not present

## 2020-07-24 ENCOUNTER — Other Ambulatory Visit: Payer: Self-pay | Admitting: Geriatric Medicine

## 2020-07-24 DIAGNOSIS — Z1231 Encounter for screening mammogram for malignant neoplasm of breast: Secondary | ICD-10-CM

## 2020-07-29 DIAGNOSIS — H0520 Unspecified exophthalmos: Secondary | ICD-10-CM | POA: Diagnosis not present

## 2020-07-29 DIAGNOSIS — H401113 Primary open-angle glaucoma, right eye, severe stage: Secondary | ICD-10-CM | POA: Diagnosis not present

## 2020-07-29 DIAGNOSIS — H401122 Primary open-angle glaucoma, left eye, moderate stage: Secondary | ICD-10-CM | POA: Diagnosis not present

## 2020-07-29 DIAGNOSIS — E119 Type 2 diabetes mellitus without complications: Secondary | ICD-10-CM | POA: Diagnosis not present

## 2020-07-29 DIAGNOSIS — Z961 Presence of intraocular lens: Secondary | ICD-10-CM | POA: Diagnosis not present

## 2020-07-29 DIAGNOSIS — H532 Diplopia: Secondary | ICD-10-CM | POA: Diagnosis not present

## 2020-07-29 DIAGNOSIS — Z9889 Other specified postprocedural states: Secondary | ICD-10-CM | POA: Diagnosis not present

## 2020-07-29 DIAGNOSIS — H26491 Other secondary cataract, right eye: Secondary | ICD-10-CM | POA: Diagnosis not present

## 2020-07-29 DIAGNOSIS — H43813 Vitreous degeneration, bilateral: Secondary | ICD-10-CM | POA: Diagnosis not present

## 2020-08-18 ENCOUNTER — Other Ambulatory Visit (HOSPITAL_BASED_OUTPATIENT_CLINIC_OR_DEPARTMENT_OTHER): Payer: Self-pay

## 2020-08-18 ENCOUNTER — Ambulatory Visit: Payer: Medicare HMO

## 2020-08-18 MED ORDER — PFIZER-BIONT COVID-19 VAC-TRIS 30 MCG/0.3ML IM SUSP
INTRAMUSCULAR | 0 refills | Status: AC
  Filled 2020-08-18: qty 0.3, 1d supply, fill #0

## 2020-08-18 NOTE — Progress Notes (Signed)
   Covid-19 Vaccination Clinic  Name:  Linda Lucas    MRN: [id number] DOB: [birth year]  08/18/2020  [name] was observed post Covid-19 immunization for 15 minutes without incident. She was provided with Vaccine Information Sheet and instruction to access the V-Safe system.   [name] was instructed to call 911 with any severe reactions post vaccine: Marland Kitchen Difficulty breathing  . Swelling of face and throat  . A fast heartbeat  . A bad rash all over body  . Dizziness and weakness

## 2020-09-10 ENCOUNTER — Other Ambulatory Visit: Payer: Self-pay | Admitting: Geriatric Medicine

## 2020-09-10 DIAGNOSIS — R911 Solitary pulmonary nodule: Secondary | ICD-10-CM

## 2020-09-18 ENCOUNTER — Other Ambulatory Visit: Payer: Self-pay

## 2020-09-18 ENCOUNTER — Ambulatory Visit
Admission: RE | Admit: 2020-09-18 | Discharge: 2020-09-18 | Disposition: A | Discharge status: Home / Self Care | Payer: Medicare HMO | Source: Ambulatory Visit | Attending: Geriatric Medicine | Admitting: Geriatric Medicine

## 2020-09-18 DIAGNOSIS — Z1231 Encounter for screening mammogram for malignant neoplasm of breast: Secondary | ICD-10-CM | POA: Diagnosis not present

## 2020-09-29 DIAGNOSIS — R4689 Other symptoms and signs involving appearance and behavior: Secondary | ICD-10-CM | POA: Diagnosis not present

## 2020-09-29 DIAGNOSIS — G255 Other chorea: Secondary | ICD-10-CM | POA: Diagnosis not present

## 2020-09-29 DIAGNOSIS — R443 Hallucinations, unspecified: Secondary | ICD-10-CM | POA: Diagnosis not present

## 2020-09-29 DIAGNOSIS — F22 Delusional disorders: Secondary | ICD-10-CM | POA: Diagnosis not present

## 2020-09-29 DIAGNOSIS — R413 Other amnesia: Secondary | ICD-10-CM | POA: Diagnosis not present

## 2020-09-29 DIAGNOSIS — G4700 Insomnia, unspecified: Secondary | ICD-10-CM | POA: Diagnosis not present

## 2020-09-29 DIAGNOSIS — R4189 Other symptoms and signs involving cognitive functions and awareness: Secondary | ICD-10-CM | POA: Diagnosis not present

## 2020-10-05 ENCOUNTER — Other Ambulatory Visit: Payer: Self-pay | Admitting: Neurology

## 2020-10-05 ENCOUNTER — Ambulatory Visit
Admission: RE | Admit: 2020-10-05 | Discharge: 2020-10-05 | Disposition: A | Discharge status: Home / Self Care | Payer: Medicare HMO | Source: Ambulatory Visit | Attending: Geriatric Medicine | Admitting: Geriatric Medicine

## 2020-10-05 DIAGNOSIS — F22 Delusional disorders: Secondary | ICD-10-CM

## 2020-10-05 DIAGNOSIS — R413 Other amnesia: Secondary | ICD-10-CM | POA: Diagnosis not present

## 2020-10-05 DIAGNOSIS — R911 Solitary pulmonary nodule: Secondary | ICD-10-CM | POA: Diagnosis not present

## 2020-10-05 DIAGNOSIS — G255 Other chorea: Secondary | ICD-10-CM

## 2020-10-12 DIAGNOSIS — N1832 Chronic kidney disease, stage 3b: Secondary | ICD-10-CM | POA: Diagnosis not present

## 2020-10-12 DIAGNOSIS — Z79899 Other long term (current) drug therapy: Secondary | ICD-10-CM | POA: Diagnosis not present

## 2020-10-12 DIAGNOSIS — Z7984 Long term (current) use of oral hypoglycemic drugs: Secondary | ICD-10-CM | POA: Diagnosis not present

## 2020-10-12 DIAGNOSIS — E1122 Type 2 diabetes mellitus with diabetic chronic kidney disease: Secondary | ICD-10-CM | POA: Diagnosis not present

## 2020-10-12 DIAGNOSIS — E039 Hypothyroidism, unspecified: Secondary | ICD-10-CM | POA: Diagnosis not present

## 2020-10-13 ENCOUNTER — Telehealth: Payer: Self-pay

## 2020-10-13 NOTE — Telephone Encounter (Signed)
Patient came by the office one hour after phone call stating that she had called several hours ago and no one had returned her call. She wanted to be seen. Scheduled for OV with Ellin Goodie on 8/3.

## 2020-10-13 NOTE — Telephone Encounter (Signed)
Pt called stating that she said pain all down her back and wanted to know if an injection will help her.  If you think that this could be an option for her she would like a call back for an appointment.  [facility] # [phone]

## 2020-10-14 ENCOUNTER — Ambulatory Visit: Payer: Medicare HMO | Admitting: Physical Medicine and Rehabilitation

## 2020-10-14 ENCOUNTER — Inpatient Hospital Stay: Admission: RE | Admit: 2020-10-14 | Payer: Medicare HMO | Source: Ambulatory Visit

## 2020-10-14 ENCOUNTER — Other Ambulatory Visit: Payer: Self-pay

## 2020-10-14 VITALS — BP 141/65 | HR 76

## 2020-10-14 DIAGNOSIS — M47816 Spondylosis without myelopathy or radiculopathy, lumbar region: Secondary | ICD-10-CM

## 2020-10-14 DIAGNOSIS — M48062 Spinal stenosis, lumbar region with neurogenic claudication: Secondary | ICD-10-CM | POA: Diagnosis not present

## 2020-10-14 DIAGNOSIS — M5416 Radiculopathy, lumbar region: Secondary | ICD-10-CM | POA: Diagnosis not present

## 2020-10-14 DIAGNOSIS — G894 Chronic pain syndrome: Secondary | ICD-10-CM

## 2020-10-14 NOTE — Progress Notes (Signed)
Linda Lucas - 75 y.o. female MRN [id number]  Date of birth: [birth year]  Office Visit Note: Visit Date: 10/14/2020 PCP: Merlene Laughter, MD Referred by: Merlene Laughter, MD  Subjective: Chief Complaint  Patient presents with   Lower Back - Pain   Right Leg - Pain   HPI: Linda Lucas is a 75 y.o. female who comes in today for evaluation of chronic, worsening and severe bilateral lower back pain radiating to right leg and foot. Patient's lumbar CT from 2019 exhibits moderate spinal stenosis at L4-L5 and multi-level advanced lumbar disc and facet degeneration.   Patient came to our office on Tuesday afternoon requesting that we work in her this week for an office visit to inquire about an epidural steroid injection. Patient's request was accommodated and was scheduled for office visit this afternoon with myself.  Office visit and evaluation was appropriate because the last injection was performed almost a year ago to the day and the patient does have a complicated medical and pain history.  Patient arrived to office with her husband this afternoon and was greeted pleasantly by staff. Patient was quick to demand that she receive an epidural steroid injection today. Patient informed that we have available openings on our injection schedule for next week and that we are happy to work with her on the day and time. Patient states that she is very angry and will go elsewhere to have her injection. Patient informed that we will be happy to see her back if needed and to let us know if she changes her mind about receiving epidural steroid injection next week.    ROS Otherwise per HPI.  Assessment & Plan: Visit Diagnoses:    ICD-10-CM   1. Lumbar radiculopathy  M54.16     2. Facet arthropathy, lumbar  M47.816     3. Spinal stenosis of lumbar region with neurogenic claudication  M48.062     4. Chronic pain syndrome  G89.4        Plan: No additional findings.   Meds & Orders: No orders of the  defined types were placed in this encounter.  No orders of the defined types were placed in this encounter.   Follow-up: Return if symptoms worsen or fail to improve.   Procedures: No procedures performed      Clinical History: CT LUMBAR SPINE WITHOUT CONTRAST   TECHNIQUE: Multidetector CT imaging of the lumbar spine was performed without intravenous contrast administration. Multiplanar CT image reconstructions were also generated.   COMPARISON: Lumbar spine MRI 12/22/2007   FINDINGS: Segmentation: 5 lumbar type vertebrae.   Alignment: Chronic trace retrolisthesis of L1 on L2, L2 on L3, and L3 on L4. Trace anterolisthesis of L4 on L5. Slight left convex lumbar curvature.   Vertebrae: No acute fracture or destructive osseous process. Progressive, severe disc space narrowing at L5-S1 with prominent degenerative vertebral sclerosis. Asymmetric right-sided endplate sclerosis at L4-5. Vacuum disc from L3-4 to L5-S1.   Paraspinal and other soft tissues: Status post cholecystectomy. Nonobstructing 6 mm right lower pole renal calculus. Minimal abdominal aortic atherosclerosis.   Disc levels:   T12-L1: Mild disc bulging without stenosis.   L1-2: Chronic mild disc bulging asymmetric to the left without stenosis.   L2-3: Circumferential disc bulging, stable to slightly increased. Mild facet and ligamentum flavum hypertrophy. No stenosis.   L3-4: Increased circumferential disc bulging and mild-to-moderate facet and ligamentum flavum hypertrophy result in borderline to mild spinal stenosis without neural foraminal stenosis.   L4-5: Progressive  disc and facet degeneration. Circumferential disc bulging, ligamentum flavum hypertrophy, and severe facet arthrosis result in new moderate spinal stenosis, left greater than right lateral recess stenosis, and mild-to-moderate right and mild left neural foraminal stenosis.   L5-S1: Similar broad central disc protrusion without  significant spinal stenosis. Disc bulging, endplate spurring, disc space height loss, and moderate facet hypertrophy result in progressive, moderate to severe bilateral neural foraminal stenosis. Potential bilateral L5 nerve root impingement.   IMPRESSION: 1. Advanced lumbar disc and facet degeneration, progressed from 2009. 2. New moderate spinal stenosis and mild-to-moderate neural foraminal stenosis at L4-5. 3. Progressive moderate to severe bilateral neural foraminal stenosis at L5-S1. 4. Borderline to mild spinal stenosis at L3-4. 5. Nonobstructing right nephrolithiasis. 6. Aortic Atherosclerosis (ICD10-I70.0).     Electronically Signed By: Sebastian AcheAllen Grady M.D. On: 07/07/2017 14:21    06/03/2017 IMAGING:  X-RAYS: AP and lateral view of the lumbar spine. These were taken and  reviewed in the office today.  INTERPRETATION: No acute findings. Diffuse degenerative arthritis  throughout.  Interstitial catheter in place.  AP view of the pelvis.  Taken and reviewed in the office today.  INTERPRETATION: No acute findings.  Hip joint spaces are maintained and  congruent.   She reports that she has never smoked. She has never used smokeless tobacco. No results for input(s): HGBA1C, LABURIC in the last 8760 hours.  Objective:  VS:  HT:    WT:   BMI:     BP:(!) 141/65  HR:76bpm  TEMP: ( )  RESP:  Physical Exam  Ortho Exam  Imaging: No results found.  Past Medical/Family/Surgical/Social History: Medications & Allergies reviewed per EMR, new medications updated. Patient Active Problem List   Diagnosis Date Noted   Auditory hallucination 10/21/2019   Aspiration of foreign body into lung 09/06/2019   Onychomycosis 03/21/2019   Skin fissure 03/21/2019   Acute infection of nasal sinus 03/15/2018   Yeast cystitis 03/14/2018   Decreased range of motion of finger of left hand 03/06/2017   Anxiety 02/15/2017   Dislocation, finger, interphalangeal joint 01/30/2017   Pain in  finger of left hand 01/27/2017   Chronic kidney disease, stage III (moderate) (HCC) 08/13/2015   Primary open angle glaucoma of both eyes, severe stage 06/29/2015   Bronchitis 04/03/2015   Essential (primary) hypertension 04/03/2015   Fibromyositis 04/03/2015   Peptic esophagitis 04/03/2015   Hearing loss of both ears 04/03/2015   History of colonic polyps 04/03/2015   Hypothyroidism 04/03/2015   Incontinence of feces 04/03/2015   Migraine without aura and without status migrainosus, not intractable 04/03/2015   Pain in joint 04/03/2015   Recurrent major depressive episodes, in full remission (HCC) 04/03/2015   Disorder of rotator cuff 11/26/2014   Other specified postprocedural states 11/26/2014   Difficulty walking 06/19/2014   Lipodystrophy 02/13/2014   Abnormal gait 10/15/2013   At low risk for fall 10/15/2013   Dyspareunia 07/25/2011   Dysuria 07/25/2011   Urge incontinence 07/25/2011   Urinary urgency 07/25/2011   Overactive bladder    Migraine    Fibromyalgia    Chronic fatigue syndrome    Metatarsal fracture    DM2 (diabetes mellitus, type 2) (HCC)    Ankle fracture    Osteoarthritis    IBS (irritable bowel syndrome)    Depression    Chronic urethritis    Degenerative disc disease, lumbar    Hypercholesteremia    Prolactin increased    Major depression in partial remission (HCC) 01/04/2011   Restless  legs syndrome 01/04/2011   Past Medical History:  Diagnosis Date   Ankle fracture    bimeolar fracture   Anxiety    Chronic urethritis    frequent uti's and or urethritis   Degenerative disc disease, lumbar 2007   bulging disc   Depression    Diabetes mellitus 2004   Fibromyalgia    Glaucoma    Hypercholesteremia    IBS (irritable bowel syndrome)    Metatarsal fracture    spiral fx left 5th metatarsal with non union requiring casting x 6 mos and electromagnetic therapy. results in reflx sympath dystrophy and fx of 3rd metatarsal after cast removed    Migraine    Osteoarthritis    Overactive bladder    Prolactin increased 05/2002   NORMAL MRI   Family History  Problem Relation Age of Onset   Other Daughter        needle stick at hospital died of AIDS and hepatiitis   Diabetes Mother    Hypertension Mother    Heart disease Mother    Heart attack Mother    Diabetes Father    Heart disease Father    Hypertension Father    Rheumatic fever Father    Diabetes Sister    Osteoarthritis Sister    Heart attack Sister    Diabetes Brother    Heart attack Brother    Cancer Maternal Grandmother        OV[facility]IAN OR [facility]   Other Son        paralyzed after motorcycle accident   Past Surgical History:  Procedure Laterality Date   ABDOMINAL HYSTERECTOMY     APPENDECTOMY  age 78   BREAST BIOPSY     x 2   BREAST EXCISIONAL BIOPSY Left 1994   BREAST EXCISIONAL BIOPSY Left 1994   BURCH PROCEDURE     CARPECTOMY HAND     LEFT FOR KEINBACH'S DZ   CATARACT EXTRACTION     CHOLECYSTECTOMY OPEN     WITH CDE   CYSTOSCOPY     X 4   FRACTURE METARSAL     FUSION OF DISCS     C 3-4 4-5 5-6 VERTBRAES IN NECK DUE TO DEGEN. DISC DZ   FUSION OF LEFT WRIST WITH PLATE AND SCREWS  2009   CADAVER BONE GRAFT AND APPLICATION OF HUMAN GROWTH FACTORS   HEMORROIDECTOMY  age 70   HYSTEROSCOPY     D&C,HYSTEROSCOPY FOR PED FIBROID    INTERSTIM IMPLANT PLACEMENT     OPEN REDUCTION INTERNAL FIXATION (ORIF) PROXIMAL PHALANX Left 10/30/2018   Procedure: OPEN REDUCTION WITH FIXATION LEFT MIDDLE FINGER;  Surgeon: Cindee Salt, MD;  Location: [facility];  Service: Orthopedics;  Laterality: Left;   ORIF RADIAL HEAD / NECK FRACTURE  2010   RIGHT RADIAL HED EXTENDING INTO JOINT SPACE WITH PLATE AND SCREWS   PERCUTANEOUS PINNING Left 01/31/2017   Procedure: PERCUTANEOUS PINNING EXTREMITYLEFT SMALL FINGER;  Surgeon: Cindee Salt, MD;  Location: [facility];  Service: Orthopedics;  Laterality: Left;   REPAIR EXTENSOR TENDON Left 10/30/2018    Procedure: EXTENSOR TENDON REPAIR;  Surgeon: Cindee Salt, MD;  Location: [facility];  Service: Orthopedics;  Laterality: Left;   RIGHT ANKLE FRACTURE     ROTATOR CUFF REPAIR     TONSILLECTOMY AND ADENOIDECTOMY     TUBAL LIGATION     WOUND EXPLORATION Left 10/30/2018   Procedure: EXPLORATION PROXIMAL INTERPHALANGEAL JOINT LEFT MIDDLE FINGER;  Surgeon: Cindee Salt, MD;  Location: [facility];  Service: Orthopedics;  Laterality: Left;   Social History   Occupational History   Not on file  Tobacco Use   Smoking status: Never   Smokeless tobacco: Never  Vaping Use   Vaping Use: Never used  Substance and Sexual Activity   Alcohol use: No    Alcohol/week: 0.0 standard drinks   Drug use: No   Sexual activity: Never    Birth control/protection: Surgical, Post-menopausal    Comment: 1st intercourse 75 yo--Fewer than 5 partners

## 2020-10-14 NOTE — Progress Notes (Signed)
Pt state lower back pain that travels down her right leg. Pt state riding in a car makes the pain worse. Pt state she takes pain meds to help ease her pain.   Numeric Pain Rating Scale and Functional Assessment Average Pain 7 Pain Right Now 2 My pain is constant, burning, and aching Pain is worse with: sitting and some activites Pain improves with: heat/ice and medication   In the last MONTH (on 0-10 scale) has pain interfered with the following?  1. General activity like being  able to carry out your everyday physical activities such as walking, climbing stairs, carrying groceries, or moving a chair?  Rating(6)  2. Relation with others like being able to carry out your usual social activities and roles such as  activities at home, at work and in your community. Rating(7)  3. Enjoyment of life such that you have  been bothered by emotional problems such as feeling anxious, depressed or irritable?  Rating(8)

## 2020-10-16 ENCOUNTER — Encounter: Payer: Self-pay | Admitting: Physical Medicine and Rehabilitation

## 2020-10-31 DIAGNOSIS — F22 Delusional disorders: Secondary | ICD-10-CM | POA: Diagnosis not present

## 2020-10-31 DIAGNOSIS — G255 Other chorea: Secondary | ICD-10-CM | POA: Diagnosis not present

## 2020-10-31 DIAGNOSIS — R413 Other amnesia: Secondary | ICD-10-CM | POA: Diagnosis not present

## 2020-11-02 DIAGNOSIS — R350 Frequency of micturition: Secondary | ICD-10-CM | POA: Diagnosis not present

## 2020-11-02 DIAGNOSIS — N3941 Urge incontinence: Secondary | ICD-10-CM | POA: Diagnosis not present

## 2020-11-02 DIAGNOSIS — Z87440 Personal history of urinary (tract) infections: Secondary | ICD-10-CM | POA: Diagnosis not present

## 2020-11-02 DIAGNOSIS — N3281 Overactive bladder: Secondary | ICD-10-CM | POA: Diagnosis not present

## 2020-11-06 ENCOUNTER — Telehealth: Payer: Self-pay | Admitting: Physical Medicine and Rehabilitation

## 2020-11-06 NOTE — Telephone Encounter (Signed)
Pt husband returned call to [place]. Please call pt husband back at [phone].

## 2020-11-06 NOTE — Telephone Encounter (Signed)
Pt husband called and his wife would like to schedule an injection with [facility]. Same one as before Bilateral L4-5 facets.  CB [phone]-- home  [phone]

## 2020-11-06 NOTE — Telephone Encounter (Signed)
Needs authorization and scheduling for L5-S1 [place]. Patient has not ben scheduled.

## 2020-11-09 ENCOUNTER — Telehealth: Payer: Self-pay | Admitting: Physical Medicine and Rehabilitation

## 2020-11-09 NOTE — Telephone Encounter (Signed)
Called patient's husband to advise that we will call to schedule once the injection has been authorized by insurance.

## 2020-11-09 NOTE — Telephone Encounter (Signed)
Patient's husband called. Missed a call from [facility] to schedule patient with Dr. Alvester Morin. His call back number is [phone]

## 2020-11-09 NOTE — Telephone Encounter (Signed)
Called patient's husband to advise that we will call to schedule once injection has been authorized by insurance.

## 2020-11-09 NOTE — Telephone Encounter (Signed)
Left message

## 2020-11-10 DIAGNOSIS — N2581 Secondary hyperparathyroidism of renal origin: Secondary | ICD-10-CM | POA: Diagnosis not present

## 2020-11-10 DIAGNOSIS — N183 Chronic kidney disease, stage 3 unspecified: Secondary | ICD-10-CM | POA: Diagnosis not present

## 2020-11-10 DIAGNOSIS — N3010 Interstitial cystitis (chronic) without hematuria: Secondary | ICD-10-CM | POA: Diagnosis not present

## 2020-11-10 DIAGNOSIS — E669 Obesity, unspecified: Secondary | ICD-10-CM | POA: Diagnosis not present

## 2020-11-10 DIAGNOSIS — D509 Iron deficiency anemia, unspecified: Secondary | ICD-10-CM | POA: Diagnosis not present

## 2020-11-10 DIAGNOSIS — I129 Hypertensive chronic kidney disease with stage 1 through stage 4 chronic kidney disease, or unspecified chronic kidney disease: Secondary | ICD-10-CM | POA: Diagnosis not present

## 2020-11-10 DIAGNOSIS — E1122 Type 2 diabetes mellitus with diabetic chronic kidney disease: Secondary | ICD-10-CM | POA: Diagnosis not present

## 2020-11-10 DIAGNOSIS — M797 Fibromyalgia: Secondary | ICD-10-CM | POA: Diagnosis not present

## 2020-11-19 DIAGNOSIS — Z79899 Other long term (current) drug therapy: Secondary | ICD-10-CM | POA: Diagnosis not present

## 2020-11-19 DIAGNOSIS — E1165 Type 2 diabetes mellitus with hyperglycemia: Secondary | ICD-10-CM | POA: Diagnosis not present

## 2020-11-19 DIAGNOSIS — E039 Hypothyroidism, unspecified: Secondary | ICD-10-CM | POA: Diagnosis not present

## 2020-11-19 DIAGNOSIS — Z7984 Long term (current) use of oral hypoglycemic drugs: Secondary | ICD-10-CM | POA: Diagnosis not present

## 2020-11-19 DIAGNOSIS — N1832 Chronic kidney disease, stage 3b: Secondary | ICD-10-CM | POA: Diagnosis not present

## 2020-11-19 DIAGNOSIS — E1122 Type 2 diabetes mellitus with diabetic chronic kidney disease: Secondary | ICD-10-CM | POA: Diagnosis not present

## 2020-11-25 ENCOUNTER — Telehealth: Payer: Self-pay | Admitting: Physical Medicine and Rehabilitation

## 2020-11-25 ENCOUNTER — Ambulatory Visit: Payer: Medicare HMO | Admitting: Physical Medicine and Rehabilitation

## 2020-11-25 DIAGNOSIS — R404 Transient alteration of awareness: Secondary | ICD-10-CM | POA: Diagnosis not present

## 2020-11-25 DIAGNOSIS — I499 Cardiac arrhythmia, unspecified: Secondary | ICD-10-CM | POA: Diagnosis not present

## 2020-11-25 DIAGNOSIS — R069 Unspecified abnormalities of breathing: Secondary | ICD-10-CM | POA: Diagnosis not present

## 2020-11-25 DIAGNOSIS — Z743 Need for continuous supervision: Secondary | ICD-10-CM | POA: Diagnosis not present

## 2020-11-25 DIAGNOSIS — R4020 Unspecified coma: Secondary | ICD-10-CM | POA: Diagnosis not present

## 2020-12-12 NOTE — Telephone Encounter (Signed)
Pts husband Udell called needing to cancel the pts appt for 1 today, as she has passed away.

## 2020-12-12 DEATH — deceased
